# Patient Record
Sex: Female | Born: 1945 | Race: Black or African American | Hispanic: No | Smoking: Never smoker
Health system: Southern US, Community
[De-identification: ages and names within clinical notes are randomized; demographics above are authoritative.]

## PROBLEM LIST (undated history)

## (undated) DIAGNOSIS — J449 Chronic obstructive pulmonary disease, unspecified: Secondary | ICD-10-CM

## (undated) DIAGNOSIS — J4489 Other specified chronic obstructive pulmonary disease: Secondary | ICD-10-CM

## (undated) DIAGNOSIS — J15212 Pneumonia due to Methicillin resistant Staphylococcus aureus: Secondary | ICD-10-CM

## (undated) DIAGNOSIS — N182 Chronic kidney disease, stage 2 (mild): Secondary | ICD-10-CM

## (undated) DIAGNOSIS — J9621 Acute and chronic respiratory failure with hypoxia: Secondary | ICD-10-CM

## (undated) DIAGNOSIS — J80 Acute respiratory distress syndrome: Secondary | ICD-10-CM

## (undated) HISTORY — DX: Acute and chronic respiratory failure with hypoxia: J96.21

## (undated) HISTORY — DX: Acute respiratory distress syndrome: J80

## (undated) HISTORY — DX: Pneumonia due to methicillin resistant Staphylococcus aureus: J15.212

## (undated) HISTORY — DX: Other specified chronic obstructive pulmonary disease: J44.89

## (undated) HISTORY — DX: Chronic kidney disease, stage 2 (mild): N18.2

## (undated) HISTORY — PX: TRACHEOSTOMY: SUR1362

## (undated) HISTORY — DX: Chronic obstructive pulmonary disease, unspecified: J44.9

---

## 2019-02-06 ENCOUNTER — Other Ambulatory Visit: Payer: Self-pay | Admitting: Internal Medicine

## 2019-02-06 DIAGNOSIS — J449 Chronic obstructive pulmonary disease, unspecified: Secondary | ICD-10-CM

## 2019-02-06 DIAGNOSIS — J15212 Pneumonia due to Methicillin resistant Staphylococcus aureus: Secondary | ICD-10-CM

## 2019-02-06 DIAGNOSIS — J9621 Acute and chronic respiratory failure with hypoxia: Secondary | ICD-10-CM

## 2019-02-06 DIAGNOSIS — N182 Chronic kidney disease, stage 2 (mild): Secondary | ICD-10-CM

## 2019-02-06 DIAGNOSIS — J80 Acute respiratory distress syndrome: Secondary | ICD-10-CM

## 2019-02-06 NOTE — Progress Notes (Signed)
[place]  Date of Service: 02/06/2019  PULMONARY CONSULT   Lindsey Collins  ZSW:[id number]  DOB: May 17, 1946     Referring Physician: [name], MD  HPI: Lindsey Collins is a 73 y.o. female seen for Acute on Chronic Respiratory Failure.  Patient is on the ventilator and full support is endotracheally intubated and currently is on propofol.  Patient was initially admitted basically unresponsive chest x-ray had shown a whiteout of the left chest.  Patient was at that time diagnosed to have healthcare associated pneumonia secondary to MRSA and suspected aspiration.  COVID test was performed which was negative.  Patient further deteriorated with development of what appears to be ARDS.  She did require prolonged mechanical ventilation for the ARDS.  And at this time patient remains orally intubated.  She did have bronchoscopy at least twice for retained tracheal secretions.  Patient is also received vancomycin and Zosyn antibiotics for pneumonia.  She was extubated on May 13 as well as May 19 and failed.  After discussion with the family it was decided that she would need a tracheostomy.  The patient is transferred to our facility for further management and she remains orally intubated.  The complicating factor is that she has a previous history of thyroid surgery neck surgery and it was suggested that ENT evaluate the patient however patient was transferred to our facility prior to that having been done.  Other complications include history of congestive heart failure coronary artery disease.  Review of Systems:  ROS performed and is unremarkable other than noted above.  Past medical history: COPD Coronary artery disease Chronic systolic heart failure Hypertension MRSA pneumonia CKD Diabetes type 2 Chronic anemia Goiter Parkinson's disease Abdominal surgery.  Past surgical history: Neck surgery Thyroid surgery Partial thyroidectomy Parathyroid  adenoma surgery Knee surgery Umbilical surgery for a perforated diverticula Appendicitis  Social history: No alcohol or drug abuse. Unknown about tobacco use  Family History: Non-Contributory to the present illness  Allergies  Reviewed on the [facility]  Medications: Reviewed on Rounds  Physical Exam:  Vitals: Temperature 97.5 pulse 82 respiratory rate 14 blood pressure 92/52 saturations 99%  Ventilator Settings orally intubated with assist control FiO2 40% tidal volume 427 PEEP 5  . General: Comfortable at this time . Eyes: Grossly normal lids, irises & conjunctiva . ENT: grossly tongue is normal . Neck: no obvious mass . Cardiovascular: S1-S2 normal no gallop or rub is noted . Respiratory: Coarse breath sounds with a few rhonchi . Abdomen: Soft and nontender . Skin: no rash seen on limited exam . Musculoskeletal: not rigid . Psychiatric:unable to assess . Neurologic: no seizure no involuntary movements         Labs on Admission:  Sodium 141 potassium 2.9 BUN 20 creatinine 0.3 White count 6.9 hemoglobin 8.1 hematocrit 26.1 platelet count 296 ABG pH 7.43 PCO2 46.6 PO2 136  Radiological Exams on Admission: *EXAM: Portable chest   INDICATION: Intubation, respiratory distress, central line placement  COMPARISON:  02/14/2005  TECHNIQUE: AP portable view of the chest was performed.  FINDINGS: A right sided central line is in place with catheter tip injecting over the SVC near cavoatrial junction. Endotracheal tube in place with tip projecting over the distal trachea at the level of the aortic arch approximately 3 7 m above the carina basilar haziness is present with a potential left lower lobe retrocardiac infiltrate. No pneumothorax. No definite pleural effusion. Heart size and mediastinal contours are within normal limits.  IMPRESSION:  Right central line and endotracheal tube in good positions. Suspect left lower lobe infiltrate versus atelectasis. No  pneumothorax.   Electronically Signed by:  Lars Pinks, MD, [facility] Radiology Electronically Signed on:  02/05/2019 4:49 PM  Assessment/Plan Patient Active Problem List   Diagnosis Date Noted  . Acute on chronic respiratory failure with hypoxia ([place])   . MRSA (methicillin resistant staphylococcus aureus) pneumonia ([place])   . MRSA (methicillin resistant staphylococcus aureus) pneumonia ([place])   . ARDS (adult respiratory distress syndrome) ([place])   . Obstructive chronic bronchitis without exacerbation ([place])   . Chronic kidney disease, stage II (mild)      1. Acute on chronic respiratory failure with hypoxia patient is currently on full support on the ventilator and assist control mode.  Patient remains orally intubated.  Has a high risk airway.  She is also requiring sedation with propofol.  Of note patient has failed extubation x2 at the other facility.  She has a complex airway history and will need tracheostomy to be done.  Discussed with primary care team and probably will need to get surgery regarding possibility of tracheostomy. 2. MRSA pneumonia healthcare associated patient has been treated will follow-up radiologically continue with supportive care continue aggressive pulmonary toilet.  She did require bronchoscopy for retained tracheal secretions so we need to be vigilant in this regard. 3. ARDS appears to be improving we will continue to monitor radiologically monitor her FiO2 requirements as well as PEEP requirements. 4. COPD reportedly patient has been on oxygen at home chronically we will continue with supportive care 5. Chronic kidney disease monitor labs as above currently creatinine is 0.3 we will continue with supportive care  I have personally seen and evaluated the patient, evaluated laboratory and imaging results, formulated the assessment and plan and placed orders. The Patient requires high complexity decision making for assessment and support.  Case was discussed on Rounds  with the Respiratory Therapy Staff Time Spent 11minutes  Allyne Gee, MD [facility]

## 2019-02-07 ENCOUNTER — Encounter: Payer: Self-pay | Admitting: Internal Medicine

## 2019-02-07 ENCOUNTER — Other Ambulatory Visit: Payer: Self-pay | Admitting: Internal Medicine

## 2019-02-07 DIAGNOSIS — J15212 Pneumonia due to Methicillin resistant Staphylococcus aureus: Secondary | ICD-10-CM

## 2019-02-07 DIAGNOSIS — J449 Chronic obstructive pulmonary disease, unspecified: Secondary | ICD-10-CM | POA: Insufficient documentation

## 2019-02-07 DIAGNOSIS — J9621 Acute and chronic respiratory failure with hypoxia: Secondary | ICD-10-CM

## 2019-02-07 DIAGNOSIS — J80 Acute respiratory distress syndrome: Secondary | ICD-10-CM

## 2019-02-07 DIAGNOSIS — N182 Chronic kidney disease, stage 2 (mild): Secondary | ICD-10-CM

## 2019-02-07 DIAGNOSIS — J9622 Acute and chronic respiratory failure with hypercapnia: Secondary | ICD-10-CM | POA: Insufficient documentation

## 2019-02-07 DIAGNOSIS — J4489 Other specified chronic obstructive pulmonary disease: Secondary | ICD-10-CM | POA: Insufficient documentation

## 2019-02-07 NOTE — Progress Notes (Signed)
[place] NOTE  PULMONARY SERVICE ROUNDS  Date of Service: 02/07/2019  Lindsey Collins  DOB: 06/03/1946  Referring physician: [name], MD  HPI: Lindsey Collins is a 73 y.o. female  being seen for Acute on Chronic Respiratory Failure.  Patient remains on the ventilator orally intubated grossly unchanged.  She has been on full support no weaning has been done  Review of Systems: Unremarkable other than noted in HPI  Allergies:  Reviewed on the [facility]  Medications: Reviewed  Vitals: Temperature 98.4 pulse 89 respiratory rate 12 blood pressure 97/51 saturations 100%  Ventilator Settings: Mode ventilation assist control FiO2 40% tidal volume 400 PEEP 5  Physical Exam: . General:  calm and comfortable NAD . Eyes: normal lids, irises & conjunctiva . ENT: grossly normal tongue not enlarged . Neck: no masses . Cardiovascular: S1 S2 Normal no rubs no gallop . Respiratory: No rhonchi no rales are noted at this time . Abdomen: soft non-distended . Skin: no rash seen on limited exam . Musculoskeletal:  no rigidity . Psychiatric: unable to assess . Neurologic: no involuntary movements          Lab Data and radiological Data:  Data has been reviewed   Assessment/Plan  Patient Active Problem List   Diagnosis Date Noted  . Acute on chronic respiratory failure with hypoxia ([place])   . MRSA (methicillin resistant staphylococcus aureus) pneumonia ([place])   . MRSA (methicillin resistant staphylococcus aureus) pneumonia ([place])   . ARDS (adult respiratory distress syndrome) ([place])   . Obstructive chronic bronchitis without exacerbation ([place])   . Chronic kidney disease, stage II (mild)       1. Acute on chronic respiratory failure with hypoxia we will continue with full support on the ventilator on assist control she is orally intubated high risk airway.  Will likely need to have a tracheostomy done secondary to her previous incursion into the head and neck  area by surgery and also the fact that she has actually failed extubation x2 at the other facility prior to being transferred here 2. MRSA pneumonia healthcare associated pneumonia treated we will continue to monitor closely.  She is a high aspiration risk secondary to history of Parkinson's disease. 3. ARDS resolving improved oxygenation. 4. COPD at baseline we will continue present management 5. Chronic kidney disease stable we will continue to monitor   I have personally evaluated the patient, evaluated the laboratory and imaging results and formulated the assessment and plan and placed orders as needed. The Patient requires high complexity decision making for assessment and support. I have discussed the patient on rounds with the Respiratory Staff   Allyne Gee, MD [facility] Pulmonary Critical Care Medicine

## 2019-02-08 ENCOUNTER — Other Ambulatory Visit (HOSPITAL_COMMUNITY): Payer: Medicare Other | Admitting: Internal Medicine

## 2019-02-08 DIAGNOSIS — J449 Chronic obstructive pulmonary disease, unspecified: Secondary | ICD-10-CM

## 2019-02-08 DIAGNOSIS — J9621 Acute and chronic respiratory failure with hypoxia: Secondary | ICD-10-CM | POA: Diagnosis not present

## 2019-02-08 DIAGNOSIS — J15212 Pneumonia due to Methicillin resistant Staphylococcus aureus: Secondary | ICD-10-CM | POA: Diagnosis not present

## 2019-02-08 DIAGNOSIS — N182 Chronic kidney disease, stage 2 (mild): Secondary | ICD-10-CM | POA: Diagnosis not present

## 2019-02-08 DIAGNOSIS — J80 Acute respiratory distress syndrome: Secondary | ICD-10-CM | POA: Diagnosis not present

## 2019-02-08 NOTE — Progress Notes (Signed)
[place] NOTE  PULMONARY SERVICE ROUNDS  Date of Service: 02/08/2019  Lindsey Collins  DOB: [birth year]  Referring physician: [name], MD  HPI: Lindsey Collins is a 73 y.o. female  being seen for Acute on Chronic Respiratory Failure.  Patient is currently on full support on the ventilator failed weaning attempt today was on 40% FiO2  Review of Systems: Unremarkable other than noted in HPI  Allergies:  Reviewed on the [facility]  Medications: Reviewed  Vitals: Temperature 99.4 pulse 77 respiratory 16 blood pressure 106/55 saturations 100%  Ventilator Settings: Mode of ventilation on assist control FiO2 40% PEEP 5 tidal volume 400  Physical Exam: . General:  calm and comfortable NAD . Eyes: normal lids, irises & conjunctiva . ENT: grossly normal tongue not enlarged . Neck: no masses . Cardiovascular: S1 S2 Normal no rubs no gallop . Respiratory: Scattered rhonchi coarse breath sounds . Abdomen: soft non-distended . Skin: no rash seen on limited exam . Musculoskeletal:  no rigidity . Psychiatric: unable to assess . Neurologic: no involuntary movements          Lab Data and radiological Data:  No labs to report today   Assessment/Plan  Patient Active Problem List   Diagnosis Date Noted  . Acute on chronic respiratory failure with hypoxia ([place])   . MRSA (methicillin resistant staphylococcus aureus) pneumonia ([place])   . MRSA (methicillin resistant staphylococcus aureus) pneumonia ([place])   . ARDS (adult respiratory distress syndrome) ([place])   . Obstructive chronic bronchitis without exacerbation ([place])   . Chronic kidney disease, stage II (mild)       1. Acute on chronic respiratory failure with hypoxia we will continue with full support on the ventilator check the spontaneous breathing trial also titrate oxygen down 2. MRSA pneumonia treated we will continue to follow radiologically 3. ARDS at baseline we will continue to monitor. 4. COPD  severe disease we will continue with supportive care 5. Chronic kidney disease monitor labs   I have personally evaluated the patient, evaluated the laboratory and imaging results and formulated the assessment and plan and placed orders as needed. The Patient requires high complexity decision making for assessment and support. I have discussed the patient on rounds with the Respiratory Staff   Allyne Gee, MD [facility] Pulmonary Critical Care Medicine

## 2019-02-09 ENCOUNTER — Other Ambulatory Visit (HOSPITAL_COMMUNITY): Payer: Medicare Other | Admitting: Internal Medicine

## 2019-02-09 DIAGNOSIS — J15212 Pneumonia due to Methicillin resistant Staphylococcus aureus: Secondary | ICD-10-CM

## 2019-02-09 DIAGNOSIS — J80 Acute respiratory distress syndrome: Secondary | ICD-10-CM

## 2019-02-09 DIAGNOSIS — J449 Chronic obstructive pulmonary disease, unspecified: Secondary | ICD-10-CM

## 2019-02-09 DIAGNOSIS — N182 Chronic kidney disease, stage 2 (mild): Secondary | ICD-10-CM | POA: Diagnosis not present

## 2019-02-09 DIAGNOSIS — J9621 Acute and chronic respiratory failure with hypoxia: Secondary | ICD-10-CM

## 2019-02-09 NOTE — Progress Notes (Signed)
[place] NOTE  PULMONARY SERVICE ROUNDS  Date of Service: 02/09/2019  Lindsey Collins  DOB: Apr 19, 1946  Referring physician: [name], MD  HPI: Lindsey Collins is a 73 y.o. female  being seen for Acute on Chronic Respiratory Failure.  Patient currently is on full support on assist control mode.  Attempted weaning did not tolerated  Review of Systems: Unremarkable other than noted in HPI  Allergies:  Reviewed on the [facility]  Medications: Reviewed  Vitals: Temperature 99.3 pulse 88 respiratory rate 13 blood pressure is 112/62  Ventilator Settings: Mode ventilation assist control FiO2 40% tidal volume 465 PEEP 5  Physical Exam: . General:  calm and comfortable NAD . Eyes: normal lids, irises & conjunctiva . ENT: grossly normal tongue not enlarged . Neck: no masses . Cardiovascular: S1 S2 Normal no rubs no gallop . Respiratory: Coarse rhonchi expansion . Abdomen: soft non-distended . Skin: no rash seen on limited exam . Musculoskeletal:  no rigidity . Psychiatric: unable to assess . Neurologic: no involuntary movements          Lab Data and radiological Data:  Data reviewed   Assessment/Plan  Patient Active Problem List   Diagnosis Date Noted  . Acute on chronic respiratory failure with hypoxia ([place])   . MRSA (methicillin resistant staphylococcus aureus) pneumonia ([place])   . MRSA (methicillin resistant staphylococcus aureus) pneumonia ([place])   . ARDS (adult respiratory distress syndrome) ([place])   . Obstructive chronic bronchitis without exacerbation ([place])   . Chronic kidney disease, stage II (mild)       1. Acute on chronic respiratory failure with hypoxia we will continue with full support on assist control mode currently on 40% FiO2 titrate oxygen down as tolerated 2. MRSA pneumonia treated clinically as it seems to be slowly improving 3. ARDS at baseline continue present management 4. COPD unknown severity continue present  management 5. Chronic kidney disease monitor labs   I have personally evaluated the patient, evaluated the laboratory and imaging results and formulated the assessment and plan and placed orders as needed. The Patient requires high complexity decision making for assessment and support. I have discussed the patient on rounds with the Respiratory Staff   Allyne Gee, MD [facility] Pulmonary Critical Care Medicine

## 2019-02-10 ENCOUNTER — Other Ambulatory Visit (HOSPITAL_COMMUNITY): Payer: Medicare Other | Admitting: Internal Medicine

## 2019-02-10 DIAGNOSIS — J449 Chronic obstructive pulmonary disease, unspecified: Secondary | ICD-10-CM

## 2019-02-10 DIAGNOSIS — J15212 Pneumonia due to Methicillin resistant Staphylococcus aureus: Secondary | ICD-10-CM | POA: Diagnosis not present

## 2019-02-10 DIAGNOSIS — J80 Acute respiratory distress syndrome: Secondary | ICD-10-CM | POA: Diagnosis not present

## 2019-02-10 DIAGNOSIS — J9621 Acute and chronic respiratory failure with hypoxia: Secondary | ICD-10-CM

## 2019-02-10 DIAGNOSIS — N182 Chronic kidney disease, stage 2 (mild): Secondary | ICD-10-CM | POA: Diagnosis not present

## 2019-02-10 NOTE — Progress Notes (Signed)
[place] NOTE  PULMONARY SERVICE ROUNDS  Date of Service: 02/10/2019  Lindsey Collins  DOB: [birth year]  Referring physician: [name], MD  HPI: Lindsey Collins is a 73 y.o. female  being seen for Acute on Chronic Respiratory Failure.  Patient is status post tracheostomy this morning right now is on assist control mode  Review of Systems: Unremarkable other than noted in HPI  Allergies:  Reviewed on the [facility]  Medications: Reviewed  Vitals: Temperature 98.3 pulse 70 respiratory rate 2120 blood pressure 108/58 saturations 100%  Ventilator Settings: Mode of ventilation assist control FiO2 40% tidal volume 496 PEEP 5  Physical Exam: . General:  calm and comfortable NAD . Eyes: normal lids, irises & conjunctiva . ENT: grossly normal tongue not enlarged . Neck: no masses . Cardiovascular: S1 S2 Normal no rubs no gallop . Respiratory: Scattered rhonchi expansion was equal . Abdomen: soft non-distended . Skin: no rash seen on limited exam . Musculoskeletal:  no rigidity . Psychiatric: unable to assess . Neurologic: no involuntary movements          Lab Data and radiological Data:  Sodium 140 potassium 3.2 BUN 5 creatinine 0.4 White count 5.9 hemoglobin 8.2 hematocrit 26.8 platelet count 216   Assessment/Plan  Patient Active Problem List   Diagnosis Date Noted  . Acute on chronic respiratory failure with hypoxia ([place])   . MRSA (methicillin resistant staphylococcus aureus) pneumonia ([place])   . MRSA (methicillin resistant staphylococcus aureus) pneumonia ([place])   . ARDS (adult respiratory distress syndrome) ([place])   . Obstructive chronic bronchitis without exacerbation ([place])   . Chronic kidney disease, stage II (mild)       1. Acute on chronic respiratory failure with hypoxia we will continue with full support on the ventilator and reassess again we can try to start weaning. 2. MRSA pneumonia treated clinically is improving 3. ARDS  unchanged 4. COPD at baseline 5. Chronic kidney disease resolved we will continue with supportive care   I have personally evaluated the patient, evaluated the laboratory and imaging results and formulated the assessment and plan and placed orders as needed. The Patient requires high complexity decision making for assessment and support. I have discussed the patient on rounds with the Respiratory Staff   Allyne Gee, MD [facility] Pulmonary Critical Care Medicine

## 2019-02-12 ENCOUNTER — Other Ambulatory Visit (HOSPITAL_COMMUNITY): Payer: Medicare Other | Admitting: Internal Medicine

## 2019-02-12 DIAGNOSIS — J449 Chronic obstructive pulmonary disease, unspecified: Secondary | ICD-10-CM

## 2019-02-12 DIAGNOSIS — J15212 Pneumonia due to Methicillin resistant Staphylococcus aureus: Secondary | ICD-10-CM

## 2019-02-12 DIAGNOSIS — J80 Acute respiratory distress syndrome: Secondary | ICD-10-CM

## 2019-02-12 DIAGNOSIS — J9621 Acute and chronic respiratory failure with hypoxia: Secondary | ICD-10-CM | POA: Diagnosis not present

## 2019-02-12 DIAGNOSIS — N182 Chronic kidney disease, stage 2 (mild): Secondary | ICD-10-CM | POA: Diagnosis not present

## 2019-02-12 NOTE — Progress Notes (Signed)
[place] NOTE  PULMONARY SERVICE ROUNDS  Date of Service: 02/12/2019  Lindsey Collins  DOB: 1946-08-29  Referring physician: [name], MD  HPI: Lindsey Collins is a 73 y.o. female  being seen for Acute on Chronic Respiratory Failure.  Patient is on full support assist control the ventilator is stable right now is on 35% FiO2  Review of Systems: Unremarkable other than noted in HPI  Allergies:  Reviewed on the [facility]  Medications: Reviewed  Vitals: Temperature 97.5 pulse 81 respiratory 26 blood pressure 147/67 saturations 97%  Ventilator Settings: Currently is on the ventilator assist control FiO2 35% tidal volume 429 PEEP 8  Physical Exam: . General:  calm and comfortable NAD . Eyes: normal lids, irises & conjunctiva . ENT: grossly normal tongue not enlarged . Neck: no masses . Cardiovascular: S1 S2 Normal no rubs no gallop . Respiratory: No rhonchi or rales are noted at this time . Abdomen: soft non-distended . Skin: no rash seen on limited exam . Musculoskeletal:  no rigidity . Psychiatric: unable to assess . Neurologic: no involuntary movements          Lab Data and radiological Data:  ABG pH 7.63 PCO2 31 PO2 79 Sodium 143 potassium 2.4 BUN 8 creatinine 0.5   Assessment/Plan  Patient Active Problem List   Diagnosis Date Noted  . Acute on chronic respiratory failure with hypoxia ([place])   . MRSA (methicillin resistant staphylococcus aureus) pneumonia ([place])   . MRSA (methicillin resistant staphylococcus aureus) pneumonia ([place])   . ARDS (adult respiratory distress syndrome) ([place])   . Obstructive chronic bronchitis without exacerbation ([place])   . Chronic kidney disease, stage II (mild)       1. Acute on chronic respiratory failure hypoxia we will continue with assessing the RSB I again continue pulmonary toilet secretion management 2. MRSA pneumonia treated we will monitor 3. ARDS clinically improving 4. COPD without  exacerbation we will continue to monitor 5. Chronic kidney disease labs resolved   I have personally evaluated the patient, evaluated the laboratory and imaging results and formulated the assessment and plan and placed orders as needed. The Patient requires high complexity decision making for assessment and support. I have discussed the patient on rounds with the Respiratory Staff   Allyne Gee, MD [facility] Pulmonary Critical Care Medicine

## 2019-02-13 ENCOUNTER — Other Ambulatory Visit (HOSPITAL_COMMUNITY): Payer: Medicare Other | Admitting: Internal Medicine

## 2019-02-13 DIAGNOSIS — N182 Chronic kidney disease, stage 2 (mild): Secondary | ICD-10-CM

## 2019-02-13 DIAGNOSIS — J449 Chronic obstructive pulmonary disease, unspecified: Secondary | ICD-10-CM

## 2019-02-13 DIAGNOSIS — J15212 Pneumonia due to Methicillin resistant Staphylococcus aureus: Secondary | ICD-10-CM

## 2019-02-13 DIAGNOSIS — J80 Acute respiratory distress syndrome: Secondary | ICD-10-CM | POA: Diagnosis not present

## 2019-02-13 DIAGNOSIS — J9621 Acute and chronic respiratory failure with hypoxia: Secondary | ICD-10-CM | POA: Diagnosis not present

## 2019-02-13 NOTE — Progress Notes (Signed)
[place] NOTE  PULMONARY SERVICE ROUNDS  Date of Service: 02/13/2019  RANDALYN AHMED  DOB: 1946/01/14  Referring physician: [name], MD  HPI: Lindsey Collins is a 73 y.o. female  being seen for Acute on Chronic Respiratory Failure.  Patient is on assist control mode currently on 35% FiO2 was attempted on our SBI but did not tolerate  Review of Systems: Unremarkable other than noted in HPI  Allergies:  Reviewed on the [facility]  Medications: Reviewed  Vitals: Temperature 97.7 pulse 77 respiratory 26 blood pressure 134/69 saturations 99%  Ventilator Settings: Mode of ventilation assist control FiO2 35% tidal volume is 450 PEEP 5  Physical Exam: . General:  calm and comfortable NAD . Eyes: normal lids, irises & conjunctiva . ENT: grossly normal tongue not enlarged . Neck: no masses . Cardiovascular: S1 S2 Normal no rubs no gallop . Respiratory: No rhonchi or rales are noted at this time. . Abdomen: soft non-distended . Skin: no rash seen on limited exam . Musculoskeletal:  no rigidity . Psychiatric: unable to assess . Neurologic: no involuntary movements          Lab Data and radiological Data:  ABG pH 7.57 PCO2 33 PO2 107 White count 8.2 hemoglobin 8.4 hematocrit 26.7 platelet count 218 Sodium 141 potassium 3.0 BUN 13 creatinine 0.7   Assessment/Plan  Patient Active Problem List   Diagnosis Date Noted  . Acute on chronic respiratory failure with hypoxia ([place])   . MRSA (methicillin resistant staphylococcus aureus) pneumonia ([place])   . MRSA (methicillin resistant staphylococcus aureus) pneumonia ([place])   . ARDS (adult respiratory distress syndrome) ([place])   . Obstructive chronic bronchitis without exacerbation ([place])   . Chronic kidney disease, stage II (mild)       1. Acute on chronic respiratory failure with hypoxia patient failed the RSB I today will reassess again later this afternoon. 2. Recent pneumonia treated followed  radiologically 3. ARDS clinically resolved 4. COPD at baseline continue present management 5. Chronic kidney disease resolved we will continue to monitor   I have personally evaluated the patient, evaluated the laboratory and imaging results and formulated the assessment and plan and placed orders as needed. The Patient requires high complexity decision making for assessment and support. I have discussed the patient on rounds with the Respiratory Staff   Allyne Gee, MD [facility] Pulmonary Critical Care Medicine

## 2019-02-14 ENCOUNTER — Other Ambulatory Visit (HOSPITAL_COMMUNITY): Payer: Medicare Other | Admitting: Internal Medicine

## 2019-02-14 DIAGNOSIS — J80 Acute respiratory distress syndrome: Secondary | ICD-10-CM | POA: Diagnosis not present

## 2019-02-14 DIAGNOSIS — N182 Chronic kidney disease, stage 2 (mild): Secondary | ICD-10-CM

## 2019-02-14 DIAGNOSIS — J15212 Pneumonia due to Methicillin resistant Staphylococcus aureus: Secondary | ICD-10-CM

## 2019-02-14 DIAGNOSIS — J9621 Acute and chronic respiratory failure with hypoxia: Secondary | ICD-10-CM | POA: Diagnosis not present

## 2019-02-14 DIAGNOSIS — J449 Chronic obstructive pulmonary disease, unspecified: Secondary | ICD-10-CM

## 2019-02-14 NOTE — Progress Notes (Signed)
[place] NOTE  PULMONARY SERVICE ROUNDS  Date of Service: 02/14/2019  Lindsey Collins  DOB: 17-Aug-1946  Referring physician: [name], MD  HPI: Lindsey Collins is a 73 y.o. female  being seen for Acute on Chronic Respiratory Failure.  Back on the ventilator and full support patient still has been on sedation which is being weaned gradually  Review of Systems: Unremarkable other than noted in HPI  Allergies:  Reviewed on the [facility]  Medications: Reviewed  Vitals: Temperature 98.7 pulse 78 respiratory 26 blood pressure 151/78 saturations 100%  Ventilator Settings: On the ventilator on assist control FiO2 35% tidal volume 419 PEEP 5  Physical Exam: . General:  calm and comfortable NAD . Eyes: normal lids, irises & conjunctiva . ENT: grossly normal tongue not enlarged . Neck: no masses . Cardiovascular: S1 S2 Normal no rubs no gallop . Respiratory: No rhonchi coarse breath sounds . Abdomen: soft non-distended . Skin: no rash seen on limited exam . Musculoskeletal:  no rigidity . Psychiatric: unable to assess . Neurologic: no involuntary movements          Lab Data and radiological Data:  No labs to report today   Assessment/Plan  Patient Active Problem List   Diagnosis Date Noted  . Acute on chronic respiratory failure with hypoxia ([place])   . MRSA (methicillin resistant staphylococcus aureus) pneumonia ([place])   . MRSA (methicillin resistant staphylococcus aureus) pneumonia ([place])   . ARDS (adult respiratory distress syndrome) ([place])   . Obstructive chronic bronchitis without exacerbation ([place])   . Chronic kidney disease, stage II (mild)       1. Acute on chronic respiratory failure with hypoxia we will continue with the full vent support at this time sedation being weaned as noted once patient is off sedation hopefully will be easier to advance on wean protocol 2. MRSA pneumonia treated follow radiologically 3. ARDS as above improving  clinically 4. COPD will continue with supportive care 5. Chronic kidney disease monitoring labs which is resolved   I have personally evaluated the patient, evaluated the laboratory and imaging results and formulated the assessment and plan and placed orders as needed. The Patient requires high complexity decision making for assessment and support. I have discussed the patient on rounds with the Respiratory Staff   Allyne Gee, MD [facility] Pulmonary Critical Care Medicine

## 2019-02-15 ENCOUNTER — Other Ambulatory Visit (HOSPITAL_COMMUNITY): Payer: Medicare Other | Admitting: Internal Medicine

## 2019-02-15 DIAGNOSIS — J80 Acute respiratory distress syndrome: Secondary | ICD-10-CM | POA: Diagnosis not present

## 2019-02-15 DIAGNOSIS — J9621 Acute and chronic respiratory failure with hypoxia: Secondary | ICD-10-CM

## 2019-02-15 DIAGNOSIS — N182 Chronic kidney disease, stage 2 (mild): Secondary | ICD-10-CM

## 2019-02-15 DIAGNOSIS — J15212 Pneumonia due to Methicillin resistant Staphylococcus aureus: Secondary | ICD-10-CM | POA: Diagnosis not present

## 2019-02-15 DIAGNOSIS — J449 Chronic obstructive pulmonary disease, unspecified: Secondary | ICD-10-CM

## 2019-02-15 NOTE — Progress Notes (Signed)
[place] NOTE  PULMONARY SERVICE ROUNDS  Date of Service: 02/15/2019  Lindsey Collins  DOB: 07/30/1946  Referring physician: [name], MD  HPI: Lindsey Collins is a 73 y.o. female  being seen for Acute on Chronic Respiratory Failure.  Patient was attempted on RSB I which the patient failed  Review of Systems: Unremarkable other than noted in HPI  Allergies:  Reviewed on the [facility]  Medications: Reviewed  Vitals: Temperature 98.5 pulse 92 respiratory rate 20 blood pressure 120/64 saturations 100%  Ventilator Settings: Mode of ventilation assist control at this time  Physical Exam: . General:  calm and comfortable NAD . Eyes: normal lids, irises & conjunctiva . ENT: grossly normal tongue not enlarged . Neck: no masses . Cardiovascular: S1 S2 Normal no rubs no gallop . Respiratory: No rhonchi coarse breath sounds . Abdomen: soft non-distended . Skin: no rash seen on limited exam . Musculoskeletal:  no rigidity . Psychiatric: unable to assess . Neurologic: no involuntary movements          Lab Data and radiological Data:  Sodium 137 potassium 4.5 BUN 25 creatinine 0.7 White count 10.1 hemoglobin 8.2 hematocrit 26.9 platelet count 199   Assessment/Plan  Patient Active Problem List   Diagnosis Date Noted  . Acute on chronic respiratory failure with hypoxia ([place])   . MRSA (methicillin resistant staphylococcus aureus) pneumonia ([place])   . MRSA (methicillin resistant staphylococcus aureus) pneumonia ([place])   . ARDS (adult respiratory distress syndrome) ([place])   . Obstructive chronic bronchitis without exacerbation ([place])   . Chronic kidney disease, stage II (mild)       1. Acute on chronic respiratory failure hypoxia we will continue with full support on the ventilator at this time. 2. MRSA pneumonia treated clinically improving we will follow along 3. ARDS resolved continue with present management.  No oxygen requirements are  low 4. Obstructive chronic bronchitis at baseline nebs as needed 5. Chronic kidney disease resolved   I have personally evaluated the patient, evaluated the laboratory and imaging results and formulated the assessment and plan and placed orders as needed. The Patient requires high complexity decision making for assessment and support. I have discussed the patient on rounds with the Respiratory Staff   Allyne Gee, MD [facility] Pulmonary Critical Care Medicine

## 2019-02-17 ENCOUNTER — Other Ambulatory Visit (HOSPITAL_COMMUNITY): Payer: Medicare Other | Admitting: Internal Medicine

## 2019-02-17 DIAGNOSIS — J15212 Pneumonia due to Methicillin resistant Staphylococcus aureus: Secondary | ICD-10-CM | POA: Diagnosis not present

## 2019-02-17 DIAGNOSIS — J9621 Acute and chronic respiratory failure with hypoxia: Secondary | ICD-10-CM | POA: Diagnosis not present

## 2019-02-17 DIAGNOSIS — J449 Chronic obstructive pulmonary disease, unspecified: Secondary | ICD-10-CM

## 2019-02-17 DIAGNOSIS — N182 Chronic kidney disease, stage 2 (mild): Secondary | ICD-10-CM | POA: Diagnosis not present

## 2019-02-17 DIAGNOSIS — J80 Acute respiratory distress syndrome: Secondary | ICD-10-CM

## 2019-02-17 DIAGNOSIS — J4489 Other specified chronic obstructive pulmonary disease: Secondary | ICD-10-CM

## 2019-02-17 NOTE — Progress Notes (Signed)
[place] NOTE  PULMONARY SERVICE ROUNDS  Date of Service: 02/17/2019  Lindsey Collins  DOB: [birth year]  Referring physician: [name], MD  HPI: Lindsey Collins is a 73 y.o. female  being seen for Acute on Chronic Respiratory Failure.  Currently patient is on full support on assist control mode has been on 30% FiO2 with a PEEP of 5  Review of Systems: Unremarkable other than noted in HPI  Allergies:  Reviewed on the [facility]  Medications: Reviewed  Vitals: Temperature 99.6 pulse 98 respiratory 19 blood pressure 140/83 saturations 99%  Ventilator Settings: Mode of ventilation assist control FiO2 30% tidal volume 420 PEEP 5  Physical Exam: . General:  calm and comfortable NAD . Eyes: normal lids, irises & conjunctiva . ENT: grossly normal tongue not enlarged . Neck: no masses . Cardiovascular: S1 S2 Normal no rubs no gallop . Respiratory: No rhonchi no rales are noted at this time . Abdomen: soft non-distended . Skin: no rash seen on limited exam . Musculoskeletal:  no rigidity . Psychiatric: unable to assess . Neurologic: no involuntary movements          Lab Data and radiological Data:  No labs to report   Assessment/Plan  Patient Active Problem List   Diagnosis Date Noted  . Acute on chronic respiratory failure with hypoxia ([place])   . MRSA (methicillin resistant staphylococcus aureus) pneumonia ([place])   . MRSA (methicillin resistant staphylococcus aureus) pneumonia ([place])   . ARDS (adult respiratory distress syndrome) ([place])   . Obstructive chronic bronchitis without exacerbation ([place])   . Chronic kidney disease, stage II (mild)       1. Acute on chronic respiratory failure with hypoxia we will continue with full support on assist control mode patient is on 30% FiO2 not able to do any weaning we are waiting word from the family as far as comfort care is concerned 2. MRSA pneumonia treated we will continue supportive care 3. ARDS  resolved we will monitor 4. COPD severe disease 5. Chronic kidney disease stable labs   I have personally evaluated the patient, evaluated the laboratory and imaging results and formulated the assessment and plan and placed orders as needed. The Patient requires high complexity decision making for assessment and support. I have discussed the patient on rounds with the Respiratory Staff   Allyne Gee, MD [facility] Pulmonary Critical Care Medicine

## 2019-02-19 ENCOUNTER — Other Ambulatory Visit (HOSPITAL_COMMUNITY): Payer: Medicare Other | Admitting: Internal Medicine

## 2019-02-19 DIAGNOSIS — J9621 Acute and chronic respiratory failure with hypoxia: Secondary | ICD-10-CM | POA: Diagnosis not present

## 2019-02-19 DIAGNOSIS — N182 Chronic kidney disease, stage 2 (mild): Secondary | ICD-10-CM | POA: Diagnosis not present

## 2019-02-19 DIAGNOSIS — J15212 Pneumonia due to Methicillin resistant Staphylococcus aureus: Secondary | ICD-10-CM

## 2019-02-19 DIAGNOSIS — J80 Acute respiratory distress syndrome: Secondary | ICD-10-CM | POA: Diagnosis not present

## 2019-02-19 DIAGNOSIS — J449 Chronic obstructive pulmonary disease, unspecified: Secondary | ICD-10-CM

## 2019-02-19 NOTE — Progress Notes (Signed)
[place] NOTE  PULMONARY SERVICE ROUNDS  Date of Service: 02/19/2019  JOHANNY SEGERS  DOB: 28-Aug-1946  Referring physician: [name], MD  HPI: Lindsey Collins is a 73 y.o. female  being seen for Acute on Chronic Respiratory Failure.  Patient currently is on assist control mode comfortable right now without distress remains on 30% FiO2 our SBI was attempted and the patient failed  Review of Systems: Unremarkable other than noted in HPI  Allergies:  Reviewed on the [facility]  Medications: Reviewed  Vitals: Temperature 100.3 pulse 104 respiratory rate 20 blood pressure 133/74 saturations 98%  Ventilator Settings: Mode of ventilation assist control FiO2 30% tidal volume 400 PEEP 5  Physical Exam: . General:  calm and comfortable NAD . Eyes: normal lids, irises & conjunctiva . ENT: grossly normal tongue not enlarged . Neck: no masses . Cardiovascular: S1 S2 Normal no rubs no gallop . Respiratory: Scattered rhonchi expansion is equal . Abdomen: soft non-distended . Skin: no rash seen on limited exam . Musculoskeletal:  no rigidity . Psychiatric: unable to assess . Neurologic: no involuntary movements          Lab Data and radiological Data:  Sodium 142 potassium 4.9 BUN 49 creatinine 1.0   Assessment/Plan  Patient Active Problem List   Diagnosis Date Noted  . Acute on chronic respiratory failure with hypoxia ([place])   . MRSA (methicillin resistant staphylococcus aureus) pneumonia ([place])   . MRSA (methicillin resistant staphylococcus aureus) pneumonia ([place])   . ARDS (adult respiratory distress syndrome) ([place])   . Obstructive chronic bronchitis without exacerbation ([place])   . Chronic kidney disease, stage II (mild)       1. Acute on chronic respiratory failure with hypoxia she continue with full support on assist control mode patient is likely septic so therefore is not been to be able to wean 2. MRSA pneumonia has been treated we will  follow-up radiologically follow-up cultures 3. ARDS patient is at baseline 4. COPD at baseline we will continue to monitor 5. Chronic kidney disease BUN slightly elevated monitor hydration status   I have personally evaluated the patient, evaluated the laboratory and imaging results and formulated the assessment and plan and placed orders as needed. The Patient requires high complexity decision making for assessment and support. I have discussed the patient on rounds with the Respiratory Staff   Allyne Gee, MD [facility] Pulmonary Critical Care Medicine

## 2019-02-20 ENCOUNTER — Other Ambulatory Visit (HOSPITAL_COMMUNITY): Payer: Medicare Other | Admitting: Internal Medicine

## 2019-02-20 DIAGNOSIS — J80 Acute respiratory distress syndrome: Secondary | ICD-10-CM

## 2019-02-20 DIAGNOSIS — J449 Chronic obstructive pulmonary disease, unspecified: Secondary | ICD-10-CM

## 2019-02-20 DIAGNOSIS — N182 Chronic kidney disease, stage 2 (mild): Secondary | ICD-10-CM

## 2019-02-20 DIAGNOSIS — J9621 Acute and chronic respiratory failure with hypoxia: Secondary | ICD-10-CM

## 2019-02-20 DIAGNOSIS — J15212 Pneumonia due to Methicillin resistant Staphylococcus aureus: Secondary | ICD-10-CM

## 2019-02-20 NOTE — Progress Notes (Signed)
[place] NOTE  PULMONARY SERVICE ROUNDS  Date of Service: 02/20/2019  Lindsey Collins  DOB: 02/14/1946  Referring physician: [name], MD  HPI: Lindsey Collins is a 73 y.o. female  being seen for Acute on Chronic Respiratory Failure.  Patient currently is on full support on assist control mode has been on 40% FiO2  Review of Systems: Unremarkable other than noted in HPI  Allergies:  Reviewed on the [facility]  Medications: Reviewed  Vitals: Temperature 98.5 pulse 85 respiratory rate 17 blood pressure 101/59 saturations 99%  Ventilator Settings: Mode ventilation assist control FiO2 is 40% tidal volume 474 PEEP 5  Physical Exam: . General:  calm and comfortable NAD . Eyes: normal lids, irises & conjunctiva . ENT: grossly normal tongue not enlarged . Neck: no masses . Cardiovascular: S1 S2 Normal no rubs no gallop . Respiratory: No rhonchi no rales . Abdomen: soft non-distended . Skin: no rash seen on limited exam . Musculoskeletal:  no rigidity . Psychiatric: unable to assess . Neurologic: no involuntary movements          Lab Data and radiological Data:  Sodium 143 potassium 4.3 BUN is 46 creatinine 0.6 White count 12 hemoglobin 8.1 hematocrit 26.9 platelet count 375   Assessment/Plan  Patient Active Problem List   Diagnosis Date Noted  . Acute on chronic respiratory failure with hypoxia ([place])   . MRSA (methicillin resistant staphylococcus aureus) pneumonia ([place])   . MRSA (methicillin resistant staphylococcus aureus) pneumonia ([employer])   . ARDS (adult respiratory distress syndrome) ([place])   . Obstructive chronic bronchitis without exacerbation ([place])   . Chronic kidney disease, stage II (mild)       1. Acute on chronic respiratory failure with hypoxia on full vent support not able to wean we will continue with supportive care 2. MRSA pneumonia treated follow radiologically 3. ARDS resolved 4. COPD at baseline continue with supportive  care 5. Chronic kidney disease labs show improvement   I have personally evaluated the patient, evaluated the laboratory and imaging results and formulated the assessment and plan and placed orders as needed. The Patient requires high complexity decision making for assessment and support. I have discussed the patient on rounds with the Respiratory Staff   Allyne Gee, MD [facility] Pulmonary Critical Care Medicine

## 2019-02-21 ENCOUNTER — Other Ambulatory Visit (HOSPITAL_COMMUNITY): Payer: Medicare Other | Admitting: Internal Medicine

## 2019-02-21 DIAGNOSIS — N182 Chronic kidney disease, stage 2 (mild): Secondary | ICD-10-CM | POA: Diagnosis not present

## 2019-02-21 DIAGNOSIS — J80 Acute respiratory distress syndrome: Secondary | ICD-10-CM

## 2019-02-21 DIAGNOSIS — J9621 Acute and chronic respiratory failure with hypoxia: Secondary | ICD-10-CM

## 2019-02-21 DIAGNOSIS — J449 Chronic obstructive pulmonary disease, unspecified: Secondary | ICD-10-CM

## 2019-02-21 DIAGNOSIS — J4489 Other specified chronic obstructive pulmonary disease: Secondary | ICD-10-CM

## 2019-02-21 DIAGNOSIS — J15212 Pneumonia due to Methicillin resistant Staphylococcus aureus: Secondary | ICD-10-CM | POA: Diagnosis not present

## 2019-02-21 NOTE — Progress Notes (Signed)
[place] NOTE  PULMONARY SERVICE ROUNDS  Date of Service: 02/21/2019  Lindsey Collins  DOB: 06-Apr-1946  Referring physician: [name], MD  HPI: Lindsey Collins is a 73 y.o. female  being seen for Acute on Chronic Respiratory Failure.  Patient is comfortable right now without distress remains on assist control mode  Review of Systems: Unremarkable other than noted in HPI  Allergies:  Reviewed on the [facility]  Medications: Reviewed  Vitals: Temperature 97.9 pulse 78 respiratory 20 blood pressure 100/60 saturations 100%  Ventilator Settings: On assist control full support FiO2 40% tidal volume 400 PEEP 5  Physical Exam: . General:  calm and comfortable NAD . Eyes: normal lids, irises & conjunctiva . ENT: grossly normal tongue not enlarged . Neck: no masses . Cardiovascular: S1 S2 Normal no rubs no gallop . Respiratory: No rhonchi or rales are noted . Abdomen: soft non-distended . Skin: no rash seen on limited exam . Musculoskeletal:  no rigidity . Psychiatric: unable to assess . Neurologic: no involuntary movements          Lab Data and radiological Data:  Sodium 146 potassium 4.9 BUN 36 creatinine 0.6 White count 11.3 hemoglobin 7.8 hematocrit 26.6 platelet count 380   Assessment/Plan  Patient Active Problem List   Diagnosis Date Noted  . Acute on chronic respiratory failure with hypoxia ([place])   . MRSA (methicillin resistant staphylococcus aureus) pneumonia ([place])   . MRSA (methicillin resistant staphylococcus aureus) pneumonia ([place])   . ARDS (adult respiratory distress syndrome) ([place])   . Obstructive chronic bronchitis without exacerbation ([place])   . Chronic kidney disease, stage II (mild)       1. Acute on chronic respiratory failure with hypoxia we will continue with support on assist control mode 2. MRSA pneumonia treated 3. ARDS resolved we will continue to follow 4. COPD at baseline 5. Chronic kidney disease numbers are at  baseline   I have personally evaluated the patient, evaluated the laboratory and imaging results and formulated the assessment and plan and placed orders as needed. The Patient requires high complexity decision making for assessment and support. I have discussed the patient on rounds with the Respiratory Staff   Allyne Gee, MD [facility] Pulmonary Critical Care Medicine

## 2019-02-22 ENCOUNTER — Other Ambulatory Visit (HOSPITAL_COMMUNITY): Payer: Medicare Other | Admitting: Internal Medicine

## 2019-02-22 DIAGNOSIS — J9621 Acute and chronic respiratory failure with hypoxia: Secondary | ICD-10-CM | POA: Diagnosis not present

## 2019-02-22 DIAGNOSIS — J80 Acute respiratory distress syndrome: Secondary | ICD-10-CM | POA: Diagnosis not present

## 2019-02-22 DIAGNOSIS — J449 Chronic obstructive pulmonary disease, unspecified: Secondary | ICD-10-CM

## 2019-02-22 DIAGNOSIS — N182 Chronic kidney disease, stage 2 (mild): Secondary | ICD-10-CM

## 2019-02-22 DIAGNOSIS — J15212 Pneumonia due to Methicillin resistant Staphylococcus aureus: Secondary | ICD-10-CM

## 2019-02-22 NOTE — Progress Notes (Signed)
[place] NOTE  PULMONARY SERVICE ROUNDS  Date of Service: 02/22/2019  Lindsey Collins  DOB: [birth year]  Referring physician: [name], MD  HPI: Lindsey Collins is a 73 y.o. female  being seen for Acute on Chronic Respiratory Failure.  Remains on the ventilator full support on assist control mode has been on 30% FiO2 the patient had an RSB I of 125 today  Review of Systems: Unremarkable other than noted in HPI  Allergies:  Reviewed on the [facility]  Medications: Reviewed  Vitals: Temperature 98.6 pulse 81 respiratory 22 blood pressure is 100/60 saturations 99%  Ventilator Settings: Mode of ventilation assist control FiO2 is 30% tidal volume 400 PEEP 5  Physical Exam: . General:  calm and comfortable NAD . Eyes: normal lids, irises & conjunctiva . ENT: grossly normal tongue not enlarged . Neck: no masses . Cardiovascular: S1 S2 Normal no rubs no gallop . Respiratory: Coarse breath sounds with few rhonchi . Abdomen: soft non-distended . Skin: no rash seen on limited exam . Musculoskeletal:  no rigidity . Psychiatric: unable to assess . Neurologic: no involuntary movements          Lab Data and radiological Data:  No labs today   Assessment/Plan  Patient Active Problem List   Diagnosis Date Noted  . Acute on chronic respiratory failure with hypoxia ([place])   . MRSA (methicillin resistant staphylococcus aureus) pneumonia ([place])   . MRSA (methicillin resistant staphylococcus aureus) pneumonia ([place])   . ARDS (adult respiratory distress syndrome) ([place])   . Obstructive chronic bronchitis without exacerbation ([place])   . Chronic kidney disease, stage II (mild)       1. Acute on chronic respiratory failure with hypoxia at this time patient is on full vent support has not been tolerating weaning started on antibiotics for new infiltrate pneumonia.  Will hold off on weaning today 2. MRSA pneumonia as above day 2 of antibiotics today continue with  supportive care 3. ARDS clinically resolved 4. COPD at baseline 5. Chronic kidney disease stage III we will continue present management   I have personally evaluated the patient, evaluated the laboratory and imaging results and formulated the assessment and plan and placed orders as needed. The Patient requires high complexity decision making for assessment and support. I have discussed the patient on rounds with the Respiratory Staff   Allyne Gee, MD [facility] Pulmonary Critical Care Medicine

## 2019-02-23 ENCOUNTER — Other Ambulatory Visit (HOSPITAL_COMMUNITY): Payer: Medicare Other | Admitting: Internal Medicine

## 2019-02-23 DIAGNOSIS — J15212 Pneumonia due to Methicillin resistant Staphylococcus aureus: Secondary | ICD-10-CM

## 2019-02-23 DIAGNOSIS — J80 Acute respiratory distress syndrome: Secondary | ICD-10-CM

## 2019-02-23 DIAGNOSIS — N182 Chronic kidney disease, stage 2 (mild): Secondary | ICD-10-CM | POA: Diagnosis not present

## 2019-02-23 DIAGNOSIS — J9621 Acute and chronic respiratory failure with hypoxia: Secondary | ICD-10-CM

## 2019-02-23 DIAGNOSIS — J449 Chronic obstructive pulmonary disease, unspecified: Secondary | ICD-10-CM

## 2019-02-23 NOTE — Progress Notes (Signed)
[place] NOTE  PULMONARY SERVICE ROUNDS  Date of Service: 02/23/2019  Lindsey Collins  DOB: 1946-07-25  Referring physician: [name], MD  HPI: Lindsey Collins is a 73 y.o. female  being seen for Acute on Chronic Respiratory Failure.  Patient is on the ventilator full support has not been weaned today remains on assist control mode  Review of Systems: Unremarkable other than noted in HPI  Allergies:  Reviewed on the [facility]  Medications: Reviewed  Vitals: Temperature 97.5 pulse 135 respiratory rate 24 blood pressure 99/76 saturations 97%  Ventilator Settings: Mode ventilation assist control FiO2 30% tidal line 400 PEEP 5  Physical Exam: . General:  calm and comfortable NAD . Eyes: normal lids, irises & conjunctiva . ENT: grossly normal tongue not enlarged . Neck: no masses . Cardiovascular: S1 S2 Normal no rubs no gallop . Respiratory: Coarse breath sounds no rhonchi . Abdomen: soft non-distended . Skin: no rash seen on limited exam . Musculoskeletal:  no rigidity . Psychiatric: unable to assess . Neurologic: no involuntary movements          Lab Data and radiological Data:  Sodium 143 potassium 4.5 BUN 20 creatinine 0.4 White count 12.5 hemoglobin 7.2 hematocrit 24.7 platelet count 402   Assessment/Plan  Patient Active Problem List   Diagnosis Date Noted  . Acute on chronic respiratory failure with hypoxia ([place])   . MRSA (methicillin resistant staphylococcus aureus) pneumonia ([place])   . MRSA (methicillin resistant staphylococcus aureus) pneumonia ([place])   . ARDS (adult respiratory distress syndrome) ([place])   . Obstructive chronic bronchitis without exacerbation ([place])   . Chronic kidney disease, stage II (mild)       1. Acute on chronic respiratory failure with hypoxia we will continue with full support on the ventilator.  Continue to check spontaneous breathing trials if patient able to tolerate 2. MRSA pneumonia treated on  antibiotics 3. ARDS clinically resolved we will continue to follow 4. COPD at baseline continue supportive care 5. Chronic kidney disease resolved   I have personally evaluated the patient, evaluated the laboratory and imaging results and formulated the assessment and plan and placed orders as needed. The Patient requires high complexity decision making for assessment and support. I have discussed the patient on rounds with the Respiratory Staff   Allyne Gee, MD [facility] Pulmonary Critical Care Medicine

## 2019-02-24 ENCOUNTER — Other Ambulatory Visit (HOSPITAL_COMMUNITY): Payer: Medicare Other | Admitting: Internal Medicine

## 2019-02-24 DIAGNOSIS — J15212 Pneumonia due to Methicillin resistant Staphylococcus aureus: Secondary | ICD-10-CM | POA: Diagnosis not present

## 2019-02-24 DIAGNOSIS — J80 Acute respiratory distress syndrome: Secondary | ICD-10-CM

## 2019-02-24 DIAGNOSIS — J9621 Acute and chronic respiratory failure with hypoxia: Secondary | ICD-10-CM | POA: Diagnosis not present

## 2019-02-24 DIAGNOSIS — N182 Chronic kidney disease, stage 2 (mild): Secondary | ICD-10-CM

## 2019-02-24 DIAGNOSIS — J4489 Other specified chronic obstructive pulmonary disease: Secondary | ICD-10-CM

## 2019-02-24 DIAGNOSIS — J449 Chronic obstructive pulmonary disease, unspecified: Secondary | ICD-10-CM

## 2019-02-24 NOTE — Progress Notes (Signed)
[place] NOTE  PULMONARY SERVICE ROUNDS  Date of Service: 02/24/2019  Lindsey Collins  DOB: [birth year]  Referring physician: [name], MD  HPI: Lindsey Collins is a 73 y.o. female  being seen for Acute on Chronic Respiratory Failure.  Patient currently is on full support on the ventilator.  Chest x-ray was showing left lower lobe infiltrate  Review of Systems: Unremarkable other than noted in HPI  Allergies:  Reviewed on the [facility]  Medications: Reviewed  Vitals: Temperature 97.9 pulse 140 respiratory rate 18 blood pressure 126/68 saturations 96%  Ventilator Settings: Mode of ventilation assist control FiO2 30% tidal volume 400 PEEP 5  Physical Exam: . General:  calm and comfortable NAD . Eyes: normal lids, irises & conjunctiva . ENT: grossly normal tongue not enlarged . Neck: no masses . Cardiovascular: S1 S2 Normal no rubs no gallop . Respiratory: Scattered rhonchi expansion is equal . Abdomen: soft non-distended . Skin: no rash seen on limited exam . Musculoskeletal:  no rigidity . Psychiatric: unable to assess . Neurologic: no involuntary movements          Lab Data and radiological Data:  Sodium 141 potassium 4.4 BUN 21 creatinine 0.4 White count 12.5 hemoglobin 7.4 hematocrit 25.9 platelet count 414   EXAM: Portable chest   INDICATION: Pneumonia, follow-up  COMPARISON: 02/19/2019  TECHNIQUE: AP portable view of the chest was performed.  FINDINGS: Tracheostomy tube remains in stable position. Right-sided PICC line also is stable. The left basilar infiltrate has progressed with some decreased aeration of the left upper lobe as well. Right lung is also less well aerated secondary to poor inspiration. No new infiltrates. Heart size and mediastinal contours are stable. No pneumothorax. Cannot exclude a left effusion.  IMPRESSION:  Decreased lung volumes bilaterally. Left lower lobe infiltrate may have mildly progressed  and there is decreased aeration of the left upper lobe possibly representing early developing infiltrate or atelectasis. Recommend follow-up. Stable right PICC line and tracheostomy tube.   Electronically Signed by: Lars Pinks, MD, [facility] Electronically Signed on: 02/24/2019 9:33 AM  Assessment/Plan  Patient Active Problem List   Diagnosis Date Noted  . Acute on chronic respiratory failure with hypoxia ([place])   . MRSA (methicillin resistant staphylococcus aureus) pneumonia ([place])   . MRSA (methicillin resistant staphylococcus aureus) pneumonia ([place])   . ARDS (adult respiratory distress syndrome) ([place])   . Obstructive chronic bronchitis without exacerbation ([place])   . Chronic kidney disease, stage II (mild)       1. Acute on chronic respiratory failure with hypoxia we will continue with T collar trials continue secretion management supportive care. 2. MRSA pneumonia treated we will continue present management 3. ARDS at baseline 4. Obstructive chronic bronchitis unchanged 5. Chronic kidney disease labs have shown resolution   I have personally evaluated the patient, evaluated the laboratory and imaging results and formulated the assessment and plan and placed orders as needed. The Patient requires high complexity decision making for assessment and support. I have discussed the patient on rounds with the Respiratory Staff   Allyne Gee, MD [facility] Pulmonary Critical Care Medicine

## 2019-02-26 ENCOUNTER — Other Ambulatory Visit (HOSPITAL_COMMUNITY): Payer: Medicare Other | Admitting: Internal Medicine

## 2019-02-26 DIAGNOSIS — N182 Chronic kidney disease, stage 2 (mild): Secondary | ICD-10-CM | POA: Diagnosis not present

## 2019-02-26 DIAGNOSIS — J9621 Acute and chronic respiratory failure with hypoxia: Secondary | ICD-10-CM

## 2019-02-26 DIAGNOSIS — J80 Acute respiratory distress syndrome: Secondary | ICD-10-CM | POA: Diagnosis not present

## 2019-02-26 DIAGNOSIS — J15212 Pneumonia due to Methicillin resistant Staphylococcus aureus: Secondary | ICD-10-CM

## 2019-02-26 DIAGNOSIS — J449 Chronic obstructive pulmonary disease, unspecified: Secondary | ICD-10-CM

## 2019-02-26 NOTE — Progress Notes (Signed)
[place] NOTE  PULMONARY SERVICE ROUNDS  Date of Service: 02/26/2019  Lindsey Collins  DOB: 10-06-1945  Referring physician: [name], MD  HPI: Lindsey Collins is a 73 y.o. female  being seen for Acute on Chronic Respiratory Failure.  Patient remains on the ventilator full support not weaning today right now is on assist control mode  Review of Systems: Unremarkable other than noted in HPI  Allergies:  Reviewed on the [facility]  Medications: Reviewed  Vitals: Temperature 97.9 pulse 74 respiratory 16 blood pressure 97/62 saturations 97%  Ventilator Settings: Mode of ventilation assist control FiO2 30% tidal volume 347 PEEP 5  Physical Exam: . General:  calm and comfortable NAD . Eyes: normal lids, irises & conjunctiva . ENT: grossly normal tongue not enlarged . Neck: no masses . Cardiovascular: S1 S2 Normal no rubs no gallop . Respiratory: No rhonchi coarse breath sounds . Abdomen: soft non-distended . Skin: no rash seen on limited exam . Musculoskeletal:  no rigidity . Psychiatric: unable to assess . Neurologic: no involuntary movements          Lab Data and radiological Data:  No labs to report   Assessment/Plan  Patient Active Problem List   Diagnosis Date Noted  . Acute on chronic respiratory failure with hypoxia ([place])   . MRSA (methicillin resistant staphylococcus aureus) pneumonia ([place])   . MRSA (methicillin resistant staphylococcus aureus) pneumonia ([place])   . ARDS (adult respiratory distress syndrome) ([place])   . Obstructive chronic bronchitis without exacerbation ([employer])   . Chronic kidney disease, stage II (mild)       1. Acute on chronic respiratory failure hypoxia we will continue with full support on assist control currently on 30% FiO2 2. MRSA pneumonia treated clinically. 3. ARDS patient's remains on the ventilator we will continue to monitor radiologically 4. Obstructive chronic bronchitis at baseline 5. Chronic  kidney disease stage III follow labs   I have personally evaluated the patient, evaluated the laboratory and imaging results and formulated the assessment and plan and placed orders as needed. The Patient requires high complexity decision making for assessment and support. I have discussed the patient on rounds with the Respiratory Staff   Allyne Gee, MD [facility] Pulmonary Critical Care Medicine

## 2019-02-27 ENCOUNTER — Other Ambulatory Visit (HOSPITAL_COMMUNITY): Payer: Medicare Other | Admitting: Internal Medicine

## 2019-02-27 DIAGNOSIS — J80 Acute respiratory distress syndrome: Secondary | ICD-10-CM

## 2019-02-27 DIAGNOSIS — N182 Chronic kidney disease, stage 2 (mild): Secondary | ICD-10-CM | POA: Diagnosis not present

## 2019-02-27 DIAGNOSIS — J15212 Pneumonia due to Methicillin resistant Staphylococcus aureus: Secondary | ICD-10-CM

## 2019-02-27 DIAGNOSIS — J9621 Acute and chronic respiratory failure with hypoxia: Secondary | ICD-10-CM

## 2019-02-27 DIAGNOSIS — J449 Chronic obstructive pulmonary disease, unspecified: Secondary | ICD-10-CM

## 2019-02-27 NOTE — Progress Notes (Signed)
[place] NOTE  PULMONARY SERVICE ROUNDS  Date of Service: 02/27/2019  Lindsey Collins  DOB: 05-29-1946  Referring physician: [name], MD  HPI: Lindsey Collins is a 73 y.o. female  being seen for Acute on Chronic Respiratory Failure.  Patient is on full support remains on assist control mode currently is on 30% FiO2 mechanics have been poor not able to do any weaning right now  Review of Systems: Unremarkable other than noted in HPI  Allergies:  Reviewed on the [facility]  Medications: Reviewed  Vitals: Temperature 97.9 pulse 74 respiratory rate 16 blood pressure 97/72 saturations 97%  Ventilator Settings: Mode of ventilation assist control FiO2 30% tidal volume is 412 PEEP 5  Physical Exam: . General:  calm and comfortable NAD . Eyes: normal lids, irises & conjunctiva . ENT: grossly normal tongue not enlarged . Neck: no masses . Cardiovascular: S1 S2 Normal no rubs no gallop . Respiratory: Scattered rhonchi expansion is equal at this time . Abdomen: soft non-distended . Skin: no rash seen on limited exam . Musculoskeletal:  no rigidity . Psychiatric: unable to assess . Neurologic: no involuntary movements          Lab Data and radiological Data:  *Study: XR CHEST SINGLE VIEW PORTABLE  Comparison: 02/24/2019  Indication: Other ( add clinical information to comment box below), pneumonia  Findings and impression: 1. Stable support apparatus. 2. Persistent but decreased airspace opacities in the left lung, greatest at the left base. Decreased small left pleural effusion. Low lung volumes on the right with bronchovascular crowding and likely mild right basilar atelectasis versus an additional site of infection.  Electronically Signed by: Neta Mends, MD, [facility] Radiology Electronically Signed on: 02/27/2019 10:13 AM  White count 11.6 hemoglobin 8.2 hematocrit 27.9 platelet count 469 Sodium 140 potassium 4.0 BUN 20 creatinine  0.6   Assessment/Plan  Patient Active Problem List   Diagnosis Date Noted  . Acute on chronic respiratory failure with hypoxia ([place])   . MRSA (methicillin resistant staphylococcus aureus) pneumonia ([place])   . MRSA (methicillin resistant staphylococcus aureus) pneumonia ([place])   . ARDS (adult respiratory distress syndrome) ([place])   . Obstructive chronic bronchitis without exacerbation ([place])   . Chronic kidney disease, stage II (mild)       1. Acute on chronic respiratory failure with hypoxia at this time patient is on full support on the ventilator.  I had a lengthy discussion with the patient's daughter and currently the patient's daughter wants everything to be done including CPR and shocking in the event of a cardiac arrest.  She also is not at this point ready to make a decision about withdrawal on the ventilator.  I also did explain to her that there is a good chance that the patient may not wean off the ventilator and she would have to go to a skilled nursing facility she understood this and she is in agreement if that is the case. 2. MRSA pneumonia last chest x-ray shows very very slight improvement in the basilar infiltrate we will continue to monitor. 3. ARDS clinically resolved we will continue with supportive care 4. COPD advanced disease continue present management 5. Chronic kidney disease follow labs 6. Parkinson's disease at baseline overall prognosis guarded   I have personally evaluated the patient, evaluated the laboratory and imaging results and formulated the assessment and plan and placed orders as needed. The Patient requires high complexity decision making for assessment and support. I have discussed  the patient on rounds with the Respiratory Staff   Allyne Gee, MD [facility] Pulmonary Critical Care Medicine

## 2019-02-28 ENCOUNTER — Other Ambulatory Visit (HOSPITAL_COMMUNITY): Payer: Medicare Other | Admitting: Internal Medicine

## 2019-02-28 DIAGNOSIS — J9621 Acute and chronic respiratory failure with hypoxia: Secondary | ICD-10-CM | POA: Diagnosis not present

## 2019-02-28 DIAGNOSIS — N182 Chronic kidney disease, stage 2 (mild): Secondary | ICD-10-CM

## 2019-02-28 DIAGNOSIS — J80 Acute respiratory distress syndrome: Secondary | ICD-10-CM | POA: Diagnosis not present

## 2019-02-28 DIAGNOSIS — J15212 Pneumonia due to Methicillin resistant Staphylococcus aureus: Secondary | ICD-10-CM

## 2019-02-28 DIAGNOSIS — J449 Chronic obstructive pulmonary disease, unspecified: Secondary | ICD-10-CM

## 2019-02-28 NOTE — Progress Notes (Signed)
[name] NOTE  PULMONARY SERVICE ROUNDS  Date of Service: 02/28/2019  Lindsey Collins  DOB: 06-Mar-1946  Referring physician: [name], MD  HPI: Lindsey Collins is a 73 y.o. female  being seen for Acute on Chronic Respiratory Failure.  Patient currently is on full support on assist control mode has been on 30% FiO2  Review of Systems: Unremarkable other than noted in HPI  Allergies:  Reviewed on the [facility]  Medications: Reviewed  Vitals: Temperature 97.1 pulse 72 respiratory 15 blood pressure 119/32 saturations is 100%  Ventilator Settings: On assist control FiO2 30% tidal line 398 PEEP 5  Physical Exam: . General:  calm and comfortable NAD . Eyes: normal lids, irises & conjunctiva . ENT: grossly normal tongue not enlarged . Neck: no masses . Cardiovascular: S1 S2 Normal no rubs no gallop . Respiratory: Scattered rhonchi no rales . Abdomen: soft non-distended . Skin: no rash seen on limited exam . Musculoskeletal:  no rigidity . Psychiatric: unable to assess . Neurologic: no involuntary movements          Lab Data and radiological Data:  No labs to report   Assessment/Plan  Patient Active Problem List   Diagnosis Date Noted  . Acute on chronic respiratory failure with hypoxia ([place])   . MRSA (methicillin resistant staphylococcus aureus) pneumonia ([place])   . MRSA (methicillin resistant staphylococcus aureus) pneumonia ([place])   . ARDS (adult respiratory distress syndrome) ([place])   . Obstructive chronic bronchitis without exacerbation ([place])   . Chronic kidney disease, stage II (mild)       1. Acute on chronic respiratory failure with hypoxia as noted previously spoke with the daughter they would like everything to be done aggressively so we will continue with full supportive care 2. MRSA pneumonia treated continue with present management. 3. ARDS patient is at baseline we will follow along the radiological appearance has  improved 4. Obstructive chronic bronchitis patient is at baseline 5. Chronic kidney disease follow labs   I have personally evaluated the patient, evaluated the laboratory and imaging results and formulated the assessment and plan and placed orders as needed. The Patient requires high complexity decision making for assessment and support. I have discussed the patient on rounds with the Respiratory Staff   Allyne Gee, MD [facility] Pulmonary Critical Care Medicine

## 2019-03-01 ENCOUNTER — Other Ambulatory Visit (HOSPITAL_COMMUNITY): Payer: Medicare Other | Admitting: Internal Medicine

## 2019-03-01 DIAGNOSIS — N182 Chronic kidney disease, stage 2 (mild): Secondary | ICD-10-CM | POA: Diagnosis not present

## 2019-03-01 DIAGNOSIS — J80 Acute respiratory distress syndrome: Secondary | ICD-10-CM | POA: Diagnosis not present

## 2019-03-01 DIAGNOSIS — J9621 Acute and chronic respiratory failure with hypoxia: Secondary | ICD-10-CM

## 2019-03-01 DIAGNOSIS — J15212 Pneumonia due to Methicillin resistant Staphylococcus aureus: Secondary | ICD-10-CM | POA: Diagnosis not present

## 2019-03-01 DIAGNOSIS — J449 Chronic obstructive pulmonary disease, unspecified: Secondary | ICD-10-CM

## 2019-03-01 NOTE — Progress Notes (Signed)
[place] NOTE  PULMONARY SERVICE ROUNDS  Date of Service: 03/01/2019  ROSALITA CAREY  DOB: October 21, 1945  Referring physician: [name], MD  HPI: Lindsey Collins is a 73 y.o. female  being seen for Acute on Chronic Respiratory Failure.  Patient is currently on full support with assist control mode has been on 35% FiO2 patient had an attempted spontaneous breathing trial which she failed  Review of Systems: Unremarkable other than noted in HPI  Allergies:  Reviewed on the [facility]  Medications: Reviewed  Vitals: Temperature 97.8 pulse 58 respiratory rate 17 blood pressure 119/4 saturations 99%  Ventilator Settings: Mode of ventilation assist control FiO2 is 35% tidal volume 400 PEEP 5  Physical Exam: . General:  calm and comfortable NAD . Eyes: normal lids, irises & conjunctiva . ENT: grossly normal tongue not enlarged . Neck: no masses . Cardiovascular: S1 S2 Normal no rubs no gallop . Respiratory: No rhonchi no rales are noted at this time . Abdomen: soft non-distended . Skin: no rash seen on limited exam . Musculoskeletal:  no rigidity . Psychiatric: unable to assess . Neurologic: no involuntary movements          Lab Data and radiological Data:  Sodium 136 potassium 3.8 BUN 21 creatinine 0.5 White count 8.7 hemoglobin 8.3 hematocrit 28.2 platelet count 433   Assessment/Plan  Patient Active Problem List   Diagnosis Date Noted  . Acute on chronic respiratory failure with hypoxia ([place])   . MRSA (methicillin resistant staphylococcus aureus) pneumonia ([place])   . MRSA (methicillin resistant staphylococcus aureus) pneumonia ([place])   . ARDS (adult respiratory distress syndrome) ([place])   . Obstructive chronic bronchitis without exacerbation ([place])   . Chronic kidney disease, stage II (mild)       1. Acute on chronic respiratory failure with hypoxia we will continue with full support patient was attempted to wean 3 and failed 2. MRSA  pneumonia treated we will continue with supportive care 3. ARDS clinically resolved 4. Obstructive chronic bronchitis stable at this time 5. Chronic kidney disease labs have normalized   I have personally evaluated the patient, evaluated the laboratory and imaging results and formulated the assessment and plan and placed orders as needed. The Patient requires high complexity decision making for assessment and support. I have discussed the patient on rounds with the Respiratory Staff   Allyne Gee, MD [facility] Pulmonary Critical Care Medicine

## 2019-03-03 ENCOUNTER — Other Ambulatory Visit (HOSPITAL_COMMUNITY): Payer: Medicare Other | Admitting: Internal Medicine

## 2019-03-03 DIAGNOSIS — J15212 Pneumonia due to Methicillin resistant Staphylococcus aureus: Secondary | ICD-10-CM | POA: Diagnosis not present

## 2019-03-03 DIAGNOSIS — J9621 Acute and chronic respiratory failure with hypoxia: Secondary | ICD-10-CM | POA: Diagnosis not present

## 2019-03-03 DIAGNOSIS — J449 Chronic obstructive pulmonary disease, unspecified: Secondary | ICD-10-CM

## 2019-03-03 DIAGNOSIS — J80 Acute respiratory distress syndrome: Secondary | ICD-10-CM

## 2019-03-03 DIAGNOSIS — N182 Chronic kidney disease, stage 2 (mild): Secondary | ICD-10-CM | POA: Diagnosis not present

## 2019-03-03 NOTE — Progress Notes (Signed)
[place] NOTE  PULMONARY SERVICE ROUNDS  Date of Service: 03/03/2019  Lindsey Collins  DOB: 1945/12/24  Referring physician: [name], MD  HPI: Lindsey Collins is a 73 y.o. female  being seen for Acute on Chronic Respiratory Failure.  Patient currently using pressure support mode has been on 30% FiO2  Review of Systems: Unremarkable other than noted in HPI  Allergies:  Reviewed on the [facility]  Medications: Reviewed  Vitals: Temperature 97.3 pulse 69 respiratory rate 22 blood pressure 110/60 saturations 100%  Ventilator Settings: Mode of ventilation pressure support FiO2 30% tidal volume 280 pressure support 12/5  Physical Exam: . General:  calm and comfortable NAD . Eyes: normal lids, irises & conjunctiva . ENT: grossly normal tongue not enlarged . Neck: no masses . Cardiovascular: S1 S2 Normal no rubs no gallop . Respiratory: No rhonchi coarse breath sounds . Abdomen: soft non-distended . Skin: no rash seen on limited exam . Musculoskeletal:  no rigidity . Psychiatric: unable to assess . Neurologic: no involuntary movements          Lab Data and radiological Data:  No labs to report   Assessment/Plan  Patient Active Problem List   Diagnosis Date Noted  . Acute on chronic respiratory failure with hypoxia ([place])   . MRSA (methicillin resistant staphylococcus aureus) pneumonia ([place])   . MRSA (methicillin resistant staphylococcus aureus) pneumonia ([place])   . ARDS (adult respiratory distress syndrome) ([place])   . Obstructive chronic bronchitis without exacerbation ([place])   . Chronic kidney disease, stage II (mild)       1. Acute on chronic respiratory failure with hypoxia patient was started on weaning protocol but did not tolerate.  The patient had to be placed back on a pressure support of 12/5 as she did not tolerate the spontaneous breathing trial 5/5 or 8/8 2. MRSA pneumonia treated we will continue support follow  radiologically 3. ARDS clinically resolved 4. COPD unknown severity continue present management 5. Chronic kidney disease "follow-up on labs   I have personally evaluated the patient, evaluated the laboratory and imaging results and formulated the assessment and plan and placed orders as needed. The Patient requires high complexity decision making for assessment and support. I have discussed the patient on rounds with the Respiratory Staff   Allyne Gee, MD [facility] Pulmonary Critical Care Medicine

## 2019-03-05 ENCOUNTER — Other Ambulatory Visit (HOSPITAL_COMMUNITY): Payer: Medicare Other | Admitting: Internal Medicine

## 2019-03-05 DIAGNOSIS — J80 Acute respiratory distress syndrome: Secondary | ICD-10-CM

## 2019-03-05 DIAGNOSIS — N182 Chronic kidney disease, stage 2 (mild): Secondary | ICD-10-CM | POA: Diagnosis not present

## 2019-03-05 DIAGNOSIS — J15212 Pneumonia due to Methicillin resistant Staphylococcus aureus: Secondary | ICD-10-CM | POA: Diagnosis not present

## 2019-03-05 DIAGNOSIS — J9621 Acute and chronic respiratory failure with hypoxia: Secondary | ICD-10-CM

## 2019-03-05 DIAGNOSIS — J449 Chronic obstructive pulmonary disease, unspecified: Secondary | ICD-10-CM

## 2019-03-05 NOTE — Progress Notes (Signed)
[place] NOTE  PULMONARY SERVICE ROUNDS  Date of Service: 03/05/2019  Lindsey Collins  DOB: Dec 06, 1945  Referring physician: [name], MD  HPI: Lindsey Collins is a 73 y.o. female  being seen for Acute on Chronic Respiratory Failure.  Patient continues to fail weaning attempts were now is on full support on assist control mode has been on 30% FiO2  Review of Systems: Unremarkable other than noted in HPI  Allergies:  Reviewed on the [facility]  Medications: Reviewed  Vitals: Temperature 98.0 pulse 63 respiratory rate 20 blood pressure 100/62 saturations 98%  Ventilator Settings: Mode of ventilation assist control FiO2 30% tidal volume is 400 PEEP 5  Physical Exam: . General:  calm and comfortable NAD . Eyes: normal lids, irises & conjunctiva . ENT: grossly normal tongue not enlarged . Neck: no masses . Cardiovascular: S1 S2 Normal no rubs no gallop . Respiratory: No rhonchi no rales are noted . Abdomen: soft non-distended . Skin: no rash seen on limited exam . Musculoskeletal:  no rigidity . Psychiatric: unable to assess . Neurologic: no involuntary movements          Lab Data and radiological Data:  Sodium 138 potassium 3.2 BUN 29 creatinine 0.7 White count 10.6 hemoglobin 8.1 hematocrit 27 platelet count 291   Assessment/Plan  Patient Active Problem List   Diagnosis Date Noted  . Acute on chronic respiratory failure with hypoxia ([place])   . MRSA (methicillin resistant staphylococcus aureus) pneumonia ([place])   . MRSA (methicillin resistant staphylococcus aureus) pneumonia ([place])   . ARDS (adult respiratory distress syndrome) ([place])   . Obstructive chronic bronchitis without exacerbation ([place])   . Chronic kidney disease, stage II (mild)       1. Acute on chronic respiratory failure with hypoxia patient continues to fail attempts at weaning on assist control mode right now on 30% FiO2 patient's tidal volume is 400 with a PEEP of  5 2. MRSA pneumonia treated we will continue with supportive care 3. ARDS patient is at baseline now continue to monitor 4. Obstructive chronic bronchitis at baseline 5. Chronic kidney disease stage III resolved   I have personally evaluated the patient, evaluated the laboratory and imaging results and formulated the assessment and plan and placed orders as needed. The Patient requires high complexity decision making for assessment and support. I have discussed the patient on rounds with the Respiratory Staff   Allyne Gee, MD [facility] Pulmonary Critical Care Medicine

## 2019-03-12 ENCOUNTER — Other Ambulatory Visit (HOSPITAL_COMMUNITY): Payer: Medicare Other | Admitting: Internal Medicine

## 2019-03-12 DIAGNOSIS — J80 Acute respiratory distress syndrome: Secondary | ICD-10-CM

## 2019-03-12 DIAGNOSIS — J15212 Pneumonia due to Methicillin resistant Staphylococcus aureus: Secondary | ICD-10-CM | POA: Diagnosis not present

## 2019-03-12 DIAGNOSIS — J449 Chronic obstructive pulmonary disease, unspecified: Secondary | ICD-10-CM

## 2019-03-12 DIAGNOSIS — J9621 Acute and chronic respiratory failure with hypoxia: Secondary | ICD-10-CM

## 2019-03-12 DIAGNOSIS — N182 Chronic kidney disease, stage 2 (mild): Secondary | ICD-10-CM

## 2019-03-12 NOTE — Progress Notes (Signed)
[place] NOTE  PULMONARY SERVICE ROUNDS  Date of Service: 03/12/2019  Lindsey Collins  DOB: 06-16-1946  Referring physician: [name], MD  HPI: ADDI PAK is a 73 y.o. female  being seen for Acute on Chronic Respiratory Failure.  Patient currently is on full support on pressure support mode has been on 30% FiO2 16/5  Review of Systems: Unremarkable other than noted in HPI  Allergies:  Reviewed on the Lindsey Collins  Medications: Reviewed  Vitals: Temperature 98.0 pulse 73 respiratory rate 20 blood pressure 120/70 saturations 99%  Ventilator Settings: Mode ventilation pressure support FiO2 30% pressure support 16 PEEP 5 tidal volume 378  Physical Exam: . General:  calm and comfortable NAD . Eyes: normal lids, irises & conjunctiva . ENT: grossly normal tongue not enlarged . Neck: no masses . Cardiovascular: S1 S2 Normal no rubs no gallop . Respiratory: No rhonchi coarse breath sounds . Abdomen: soft non-distended . Skin: no rash seen on limited exam . Musculoskeletal:  no rigidity . Psychiatric: unable to assess . Neurologic: no involuntary movements          Lab Data and radiological Data:  No labs to report   Assessment/Plan  Patient Active Problem List   Diagnosis Date Noted  . Acute on chronic respiratory failure with hypoxia (Lindsey Collins)   . MRSA (methicillin resistant staphylococcus aureus) pneumonia (Lindsey Collins)   . MRSA (methicillin resistant staphylococcus aureus) pneumonia (Lindsey Collins)   . ARDS (adult respiratory distress syndrome) (Lindsey Collins)   . Obstructive chronic bronchitis without exacerbation (Lindsey Collins)   . Chronic kidney disease, stage II (mild)       1. Acute on chronic respiratory failure with hypoxia we will try to continue to wean patient is on pressure support now should be able to try T collar 2. MRSA pneumonia treated we will continue with supportive care 3. ARDS clinically resolved we will monitor 4. COPD at baseline continue present  management 5. Chronic kidney disease follow-up labs   I have personally evaluated the patient, evaluated the laboratory and imaging results and formulated the assessment and plan and placed orders as needed. The Patient requires high complexity decision making for assessment and support. I have discussed the patient on rounds with the Respiratory Staff   Lindsey Gee, MD [facility] Pulmonary Critical Care Medicine

## 2019-03-14 ENCOUNTER — Other Ambulatory Visit (HOSPITAL_COMMUNITY): Payer: Medicare Other | Admitting: Internal Medicine

## 2019-03-14 DIAGNOSIS — J15212 Pneumonia due to Methicillin resistant Staphylococcus aureus: Secondary | ICD-10-CM

## 2019-03-14 DIAGNOSIS — J9621 Acute and chronic respiratory failure with hypoxia: Secondary | ICD-10-CM | POA: Diagnosis not present

## 2019-03-14 DIAGNOSIS — J80 Acute respiratory distress syndrome: Secondary | ICD-10-CM

## 2019-03-14 DIAGNOSIS — J449 Chronic obstructive pulmonary disease, unspecified: Secondary | ICD-10-CM

## 2019-03-14 DIAGNOSIS — N182 Chronic kidney disease, stage 2 (mild): Secondary | ICD-10-CM | POA: Diagnosis not present

## 2019-03-14 NOTE — Progress Notes (Signed)
[place] NOTE  PULMONARY SERVICE ROUNDS  Date of Service: 03/14/2019  Lindsey Collins  DOB: 02-Dec-1945  Referring physician: [name], MD  HPI: Lindsey Collins is a 73 y.o. female  being seen for Acute on Chronic Respiratory Failure.  Remains on full support on pressure control mode has been on 30% FiO2  Review of Systems: Unremarkable other than noted in HPI  Allergies:  Reviewed on the [facility]  Medications: Reviewed  Vitals: Temperature 96.8 pulse 72 respiratory rate 26 blood pressure 154/72 saturations 98%  Ventilator Settings: Mode ventilation pressure assist control FiO2 30% tidal volume 275 PEEP 5  Physical Exam: . General:  calm and comfortable NAD . Eyes: normal lids, irises & conjunctiva . ENT: grossly normal tongue not enlarged . Neck: no masses . Cardiovascular: S1 S2 Normal no rubs no gallop . Respiratory: Scattered rhonchi expansion is equal . Abdomen: soft non-distended . Skin: no rash seen on limited exam . Musculoskeletal:  no rigidity . Psychiatric: unable to assess . Neurologic: no involuntary movements          Lab Data and radiological Data:  No labs to report   Assessment/Plan  Patient Active Problem List   Diagnosis Date Noted  . Acute on chronic respiratory failure with hypoxia ([place])   . MRSA (methicillin resistant staphylococcus aureus) pneumonia ([place])   . MRSA (methicillin resistant staphylococcus aureus) pneumonia ([place])   . ARDS (adult respiratory distress syndrome) ([place])   . Obstructive chronic bronchitis without exacerbation ([place])   . Chronic kidney disease, stage II (mild)       1. Acute on chronic respiratory failure with hypoxia we will continue with full vent support patient has not been tolerating weaning.  Continue pulmonary toilet 2. MRSA pneumonia treated 3. ARDS resolved 4. COPD nebulizers as necessary 5. Chronic kidney disease follow-up labs   I have personally evaluated the patient,  evaluated the laboratory and imaging results and formulated the assessment and plan and placed orders as needed. The Patient requires high complexity decision making for assessment and support. I have discussed the patient on rounds with the Respiratory [place] [id number] [facility] Pulmonary Critical Care Medicine

## 2019-03-15 ENCOUNTER — Other Ambulatory Visit (HOSPITAL_COMMUNITY): Payer: Medicare Other | Admitting: Internal Medicine

## 2019-03-15 DIAGNOSIS — J15212 Pneumonia due to Methicillin resistant Staphylococcus aureus: Secondary | ICD-10-CM

## 2019-03-15 DIAGNOSIS — N182 Chronic kidney disease, stage 2 (mild): Secondary | ICD-10-CM

## 2019-03-15 DIAGNOSIS — J80 Acute respiratory distress syndrome: Secondary | ICD-10-CM | POA: Diagnosis not present

## 2019-03-15 DIAGNOSIS — J9621 Acute and chronic respiratory failure with hypoxia: Secondary | ICD-10-CM | POA: Diagnosis not present

## 2019-03-15 DIAGNOSIS — J449 Chronic obstructive pulmonary disease, unspecified: Secondary | ICD-10-CM

## 2019-03-15 NOTE — Progress Notes (Signed)
[place] NOTE  PULMONARY SERVICE ROUNDS  Date of Service: 03/15/2019  HALLIE ERTL  DOB: 03-30-1946  Referring physician: [name], MD  HPI: Lindsey Collins is a 73 y.o. female  being seen for Acute on Chronic Respiratory Failure.  Patient is on full support on pressure control mode has been on 30% FiO2 inspiratory pressure of 18  Review of Systems: Unremarkable other than noted in HPI  Allergies:  Reviewed on the [facility]  Medications: Reviewed  Vitals: Temperature 99.1 pulse 75 respiratory 18 blood pressure 101/58 saturations 96%  Ventilator Settings: Mode ventilation pressure assist control FiO2 30% inspiratory pressure 18 PEEP 5  Physical Exam: . General:  calm and comfortable NAD . Eyes: normal lids, irises & conjunctiva . ENT: grossly normal tongue not enlarged . Neck: no masses . Cardiovascular: S1 S2 Normal no rubs no gallop . Respiratory: No rhonchi no rales are noted at this time . Abdomen: soft non-distended . Skin: no rash seen on limited exam . Musculoskeletal:  no rigidity . Psychiatric: unable to assess . Neurologic: no involuntary movements          Lab Data and radiological Data:  No labs to report   Assessment/Plan  Patient Active Problem List   Diagnosis Date Noted  . Acute on chronic respiratory failure with hypoxia ([place])   . MRSA (methicillin resistant staphylococcus aureus) pneumonia ([place])   . MRSA (methicillin resistant staphylococcus aureus) pneumonia ([place])   . ARDS (adult respiratory distress syndrome) ([place])   . Obstructive chronic bronchitis without exacerbation ([place])   . Chronic kidney disease, stage II (mild)       1. Acute on chronic respiratory failure with hypoxia we will continue with full vent support patient's RSB I was 210 will reassess again 2. MRSA pneumonia treated we will continue with supportive care 3. ARDS clinically resolved 4. COPD at baseline 5. Chronic kidney disease monitor  labs   I have personally evaluated the patient, evaluated the laboratory and imaging results and formulated the assessment and plan and placed orders as needed. The Patient requires high complexity decision making for assessment and support. I have discussed the patient on rounds with the Respiratory Staff   Allyne Gee, MD [facility] Pulmonary Critical Care Medicine

## 2019-03-16 ENCOUNTER — Other Ambulatory Visit (HOSPITAL_COMMUNITY): Payer: Medicare Other | Admitting: Internal Medicine

## 2019-03-16 DIAGNOSIS — J15212 Pneumonia due to Methicillin resistant Staphylococcus aureus: Secondary | ICD-10-CM

## 2019-03-16 DIAGNOSIS — J9621 Acute and chronic respiratory failure with hypoxia: Secondary | ICD-10-CM

## 2019-03-16 DIAGNOSIS — J449 Chronic obstructive pulmonary disease, unspecified: Secondary | ICD-10-CM

## 2019-03-16 DIAGNOSIS — N182 Chronic kidney disease, stage 2 (mild): Secondary | ICD-10-CM

## 2019-03-16 DIAGNOSIS — J80 Acute respiratory distress syndrome: Secondary | ICD-10-CM

## 2019-03-16 NOTE — Progress Notes (Signed)
[place] NOTE  PULMONARY SERVICE ROUNDS  Date of Service: 03/16/2019  Lindsey Collins  DOB: Jan 31, 1946  Referring physician: [name], MD  HPI: Lindsey Collins is a 73 y.o. female  being seen for Acute on Chronic Respiratory Failure.  Patient had a difficult time today with the RSB I was not able to tolerate any weaning at all  Review of Systems: Unremarkable other than noted in HPI  Allergies:  Reviewed on the [facility]  Medications: Reviewed  Vitals: Temperature 97.1 pulse 88 respiratory rate 16 blood pressure 104/68 saturations 98%  Ventilator Settings: Mode ventilation pressure assist control FiO2 30% tidal volume 390 PEEP 5  Physical Exam: . General:  calm and comfortable NAD . Eyes: normal lids, irises & conjunctiva . ENT: grossly normal tongue not enlarged . Neck: no masses . Cardiovascular: S1 S2 Normal no rubs no gallop . Respiratory: Coarse rhonchi noted bilaterally . Abdomen: soft non-distended . Skin: no rash seen on limited exam . Musculoskeletal:  no rigidity . Psychiatric: unable to assess . Neurologic: no involuntary movements          Lab Data and radiological Data:  No labs to report   Assessment/Plan  Patient Active Problem List   Diagnosis Date Noted  . Acute on chronic respiratory failure with hypoxia ([place])   . MRSA (methicillin resistant staphylococcus aureus) pneumonia ([place])   . MRSA (methicillin resistant staphylococcus aureus) pneumonia ([place])   . ARDS (adult respiratory distress syndrome) ([place])   . Obstructive chronic bronchitis without exacerbation ([place])   . Chronic kidney disease, stage II (mild)       1. Acute on chronic respiratory failure with proximal continue with full support on the ventilator failed weaning attempt today 2. MRSA pneumonia treated we will continue with supportive care 3. ARDS at baseline continue present management 4. COPD nebulizers as necessary 5. Chronic kidney disease follow  labs   I have personally evaluated the patient, evaluated the laboratory and imaging results and formulated the assessment and plan and placed orders as needed. The Patient requires high complexity decision making for assessment and support. I have discussed the patient on rounds with the Respiratory Staff   Allyne Gee, MD [facility] Pulmonary Critical Care Medicine

## 2019-03-17 ENCOUNTER — Other Ambulatory Visit (HOSPITAL_COMMUNITY): Payer: Medicare Other | Admitting: Internal Medicine

## 2019-03-17 DIAGNOSIS — J80 Acute respiratory distress syndrome: Secondary | ICD-10-CM

## 2019-03-17 DIAGNOSIS — N182 Chronic kidney disease, stage 2 (mild): Secondary | ICD-10-CM | POA: Diagnosis not present

## 2019-03-17 DIAGNOSIS — J9621 Acute and chronic respiratory failure with hypoxia: Secondary | ICD-10-CM

## 2019-03-17 DIAGNOSIS — J15212 Pneumonia due to Methicillin resistant Staphylococcus aureus: Secondary | ICD-10-CM

## 2019-03-17 DIAGNOSIS — J449 Chronic obstructive pulmonary disease, unspecified: Secondary | ICD-10-CM

## 2019-03-17 NOTE — Progress Notes (Signed)
[place] NOTE  PULMONARY SERVICE ROUNDS  Date of Service: 03/17/2019  Lindsey Collins  DOB: 07-30-1946  Referring physician: [name], MD  HPI: Lindsey Collins is a 73 y.o. female  being seen for Acute on Chronic Respiratory Failure.  Patient is on full support on pressure control mode at this time on 30% FiO2  Review of Systems: Unremarkable other than noted in HPI  Allergies:  Reviewed on the [facility]  Medications: Reviewed  Vitals: Temperature 97.6 pulse 66 respiratory 22 blood pressure 110/70 saturations 99%  Ventilator Settings: Mode ventilation pressure assist control FiO2 30% tidal line 435 PEEP 5  Physical Exam: . General:  calm and comfortable NAD . Eyes: normal lids, irises & conjunctiva . ENT: grossly normal tongue not enlarged . Neck: no masses . Cardiovascular: S1 S2 Normal no rubs no gallop . Respiratory: No rhonchi coarse breath sounds . Abdomen: soft non-distended . Skin: no rash seen on limited exam . Musculoskeletal:  no rigidity . Psychiatric: unable to assess . Neurologic: no involuntary movements          Lab Data and radiological Data:  No labs to report   Assessment/Plan  Patient Active Problem List   Diagnosis Date Noted  . Acute on chronic respiratory failure with hypoxia ([place])   . MRSA (methicillin resistant staphylococcus aureus) pneumonia ([place])   . MRSA (methicillin resistant staphylococcus aureus) pneumonia ([place])   . ARDS (adult respiratory distress syndrome) ([place])   . Obstructive chronic bronchitis without exacerbation ([place])   . Chronic kidney disease, stage II (mild)       1. Acute on chronic respiratory failure with hypoxia we will continue with full support spontaneous breathing trial attempted failed 2. MRSA pneumonia treated 3. ARDS clinically resolved 4. COPD at baseline nebulizers as necessary 5. Chronic kidney disease resolved   I have personally evaluated the patient, evaluated the  laboratory and imaging results and formulated the assessment and plan and placed orders as needed. The Patient requires high complexity decision making for assessment and support. I have discussed the patient on rounds with the Respiratory Staff   Allyne Gee, MD [facility] Pulmonary Critical Care Medicine

## 2019-03-19 ENCOUNTER — Other Ambulatory Visit (HOSPITAL_COMMUNITY): Payer: Medicare Other | Admitting: Internal Medicine

## 2019-03-19 DIAGNOSIS — J80 Acute respiratory distress syndrome: Secondary | ICD-10-CM | POA: Diagnosis not present

## 2019-03-19 DIAGNOSIS — N182 Chronic kidney disease, stage 2 (mild): Secondary | ICD-10-CM | POA: Diagnosis not present

## 2019-03-19 DIAGNOSIS — J15212 Pneumonia due to Methicillin resistant Staphylococcus aureus: Secondary | ICD-10-CM

## 2019-03-19 DIAGNOSIS — J9621 Acute and chronic respiratory failure with hypoxia: Secondary | ICD-10-CM | POA: Diagnosis not present

## 2019-03-19 DIAGNOSIS — J449 Chronic obstructive pulmonary disease, unspecified: Secondary | ICD-10-CM

## 2019-03-19 NOTE — Progress Notes (Signed)
[place] NOTE  PULMONARY SERVICE ROUNDS  Date of Service: 03/19/2019  Lindsey Collins  DOB: 09/25/1945  Referring physician: [name], MD  HPI: Lindsey Collins is a 73 y.o. female  being seen for Acute on Chronic Respiratory Failure.  Patient is on full support on assist control mode has been on 30% FiO2 with a PEEP of 5  Review of Systems: Unremarkable other than noted in HPI  Allergies:  Reviewed on the [facility]  Medications: Reviewed  Vitals: Temperature 97.9 pulse 70 respiratory rate 20 blood pressure 138/78 saturations 96%  Ventilator Settings: Mode of ventilation assist control FiO2 30% tidal volume 500 PEEP 5  Physical Exam: . General:  calm and comfortable NAD . Eyes: normal lids, irises & conjunctiva . ENT: grossly normal tongue not enlarged . Neck: no masses . Cardiovascular: S1 S2 Normal no rubs no gallop . Respiratory: No rhonchi no rales are noted at this time . Abdomen: soft non-distended . Skin: no rash seen on limited exam . Musculoskeletal:  no rigidity . Psychiatric: unable to assess . Neurologic: no involuntary movements          Lab Data and radiological Data:  No labs to report   Assessment/Plan  Patient Active Problem List   Diagnosis Date Noted  . Acute on chronic respiratory failure with hypoxia ([place])   . MRSA (methicillin resistant staphylococcus aureus) pneumonia ([place])   . MRSA (methicillin resistant staphylococcus aureus) pneumonia ([place])   . ARDS (adult respiratory distress syndrome) ([place])   . Obstructive chronic bronchitis without exacerbation ([place])   . Chronic kidney disease, stage II (mild)       1. Acute on chronic respiratory failure with hypoxia patient not been tolerating much in the way of weaning patient will be continued with assist control mode continue to check the RSB I. 2. MRSA pneumonia treated continue with supportive care 3. ARDS clinically resolved 4. Obstructive chronic bronchitis  grossly unchanged 5. Chronic kidney disease stage II follow labs   I have personally evaluated the patient, evaluated the laboratory and imaging results and formulated the assessment and plan and placed orders as needed. The Patient requires high complexity decision making for assessment and support. I have discussed the patient on rounds with the Respiratory Staff   Allyne Gee, MD [facility] Pulmonary Critical Care Medicine

## 2019-03-21 ENCOUNTER — Other Ambulatory Visit (HOSPITAL_COMMUNITY): Payer: Medicare Other | Admitting: Internal Medicine

## 2019-03-21 DIAGNOSIS — J80 Acute respiratory distress syndrome: Secondary | ICD-10-CM | POA: Diagnosis not present

## 2019-03-21 DIAGNOSIS — N182 Chronic kidney disease, stage 2 (mild): Secondary | ICD-10-CM

## 2019-03-21 DIAGNOSIS — J9621 Acute and chronic respiratory failure with hypoxia: Secondary | ICD-10-CM | POA: Diagnosis not present

## 2019-03-21 DIAGNOSIS — J15212 Pneumonia due to Methicillin resistant Staphylococcus aureus: Secondary | ICD-10-CM

## 2019-03-21 DIAGNOSIS — J449 Chronic obstructive pulmonary disease, unspecified: Secondary | ICD-10-CM

## 2019-03-21 NOTE — Progress Notes (Signed)
[place] NOTE  PULMONARY SERVICE ROUNDS  Date of Service: 03/21/2019  Lindsey Collins  DOB: 1945/12/21  Referring physician: [name], MD  HPI: Lindsey Collins is a 73 y.o. female  being seen for Acute on Chronic Respiratory Failure.  Patient currently is on full support has not been doing well as far as being able to wean in fact she is not going to be able to wean.  She is going to need placement into a skilled nursing facility.  Review of Systems: Unremarkable other than noted in HPI  Allergies:  Reviewed on the [facility]  Medications: Reviewed  Vitals: Temperature 99.2 pulse 72 respiratory 18 blood pressure 118/80 saturations 99%  Ventilator Settings: Mode ventilation pressure assist control FiO2 35% tidal volume 458 PEEP 8  Physical Exam: . General:  calm and comfortable NAD . Eyes: normal lids, irises & conjunctiva . ENT: grossly normal tongue not enlarged . Neck: no masses . Cardiovascular: S1 S2 Normal no rubs no gallop . Respiratory: No rhonchi or rales are noted at this time . Abdomen: soft non-distended . Skin: no rash seen on limited exam . Musculoskeletal:  no rigidity . Psychiatric: unable to assess . Neurologic: no involuntary movements          Lab Data and radiological Data:  Sodium 150 potassium 3.3 BUN 69 creatinine 1.5   Assessment/Plan  Patient Active Problem List   Diagnosis Date Noted  . Acute on chronic respiratory failure with hypoxia ([place])   . MRSA (methicillin resistant staphylococcus aureus) pneumonia ([place])   . MRSA (methicillin resistant staphylococcus aureus) pneumonia ([place])   . ARDS (adult respiratory distress syndrome) ([place])   . Obstructive chronic bronchitis without exacerbation ([place])   . Chronic kidney disease, stage II (mild)       1. Acute on chronic respiratory failure with hypoxia we will continue with full vent support at this time patient is not tolerating weaning at this stage. 2. MRSA  pneumonia treated we will continue with supportive care 3. ARDS at baseline resolved 4. Chronic obstructive bronchitis patient is at baseline nebulizers as necessary 5. Chronic kidney disease labs as noted above   I have personally evaluated the patient, evaluated the laboratory and imaging results and formulated the assessment and plan and placed orders as needed. The Patient requires high complexity decision making for assessment and support. I have discussed the patient on rounds with the Respiratory Staff   Allyne Gee, MD [facility] Pulmonary Critical Care Medicine

## 2019-03-22 ENCOUNTER — Other Ambulatory Visit (HOSPITAL_COMMUNITY): Payer: Medicare Other | Admitting: Internal Medicine

## 2019-03-22 DIAGNOSIS — J15212 Pneumonia due to Methicillin resistant Staphylococcus aureus: Secondary | ICD-10-CM

## 2019-03-22 DIAGNOSIS — J9621 Acute and chronic respiratory failure with hypoxia: Secondary | ICD-10-CM

## 2019-03-22 DIAGNOSIS — J80 Acute respiratory distress syndrome: Secondary | ICD-10-CM | POA: Diagnosis not present

## 2019-03-22 DIAGNOSIS — N182 Chronic kidney disease, stage 2 (mild): Secondary | ICD-10-CM | POA: Diagnosis not present

## 2019-03-22 DIAGNOSIS — J449 Chronic obstructive pulmonary disease, unspecified: Secondary | ICD-10-CM

## 2019-03-22 NOTE — Progress Notes (Signed)
[name]  PULMONARY SERVICE ROUNDS  Date of Service: 03/22/2019  Lindsey Collins  DOB: Jan 29, 1946  Referring physician: [name], MD  HPI: Lindsey Collins is a 73 y.o. female  being seen for Acute on Chronic Respiratory Failure.  Patient remains on the ventilator full support has not been tolerating any weaning attempts.  Was also getting a MRI and EEG today  Review of Systems: Unremarkable other than noted in HPI  Allergies:  Reviewed on the [facility]  Medications: Reviewed  Vitals: Temperature 97.8 pulse 60 respiratory 20 blood pressure 110/60 saturations 100%  Ventilator Settings: Mode ventilation pressure assist control FiO2 40% inspiratory pressures 22 PEEP 8  Physical Exam: . General:  calm and comfortable NAD . Eyes: normal lids, irises & conjunctiva . ENT: grossly normal tongue not enlarged . Neck: no masses . Cardiovascular: S1 S2 Normal no rubs no gallop . Respiratory: No rhonchi no rales are noted . Abdomen: soft non-distended . Skin: no rash seen on limited exam . Musculoskeletal:  no rigidity . Psychiatric: unable to assess . Neurologic: no involuntary movements          Lab Data and radiological Data:  Sodium 145 potassium 3.3 BUN 68 creatinine 1.4   Assessment/Plan  Patient Active Problem List   Diagnosis Date Noted  . Acute on chronic respiratory failure with hypoxia ([place])   . MRSA (methicillin resistant staphylococcus aureus) pneumonia ([place])   . MRSA (methicillin resistant staphylococcus aureus) pneumonia ([place])   . ARDS (adult respiratory distress syndrome) ([place])   . Obstructive chronic bronchitis without exacerbation ([place])   . Chronic kidney disease, stage II (mild)       1. Acute on chronic respiratory failure with hypoxia we will continue with full support on the ventilator patient is not able to do any weaning 2. MRSA pneumonia treated we will continue supportive care 3. ARDS clinically resolved 4. COPD  advanced disease continue with supportive care 5. Chronic kidney disease stage II following labs   I have personally evaluated the patient, evaluated the laboratory and imaging results and formulated the assessment and plan and placed orders as needed. The Patient requires high complexity decision making for assessment and support. I have discussed the patient on rounds with the Respiratory Staff   Allyne Gee, MD [facility] Pulmonary Critical Care Medicine

## 2019-03-24 ENCOUNTER — Other Ambulatory Visit (HOSPITAL_COMMUNITY): Payer: Medicare Other | Admitting: Internal Medicine

## 2019-03-24 DIAGNOSIS — J15212 Pneumonia due to Methicillin resistant Staphylococcus aureus: Secondary | ICD-10-CM

## 2019-03-24 DIAGNOSIS — N182 Chronic kidney disease, stage 2 (mild): Secondary | ICD-10-CM | POA: Diagnosis not present

## 2019-03-24 DIAGNOSIS — J449 Chronic obstructive pulmonary disease, unspecified: Secondary | ICD-10-CM

## 2019-03-24 DIAGNOSIS — J80 Acute respiratory distress syndrome: Secondary | ICD-10-CM

## 2019-03-24 DIAGNOSIS — J9621 Acute and chronic respiratory failure with hypoxia: Secondary | ICD-10-CM

## 2019-03-24 NOTE — Progress Notes (Signed)
[name] NOTE  PULMONARY SERVICE ROUNDS  Date of Service: 03/24/2019  [name]  DOB: 1946/08/23  Referring physician: [name], MD  HPI: Lindsey Collins is a 73 y.o. female  being seen for Acute on Chronic Respiratory Failure.  Patient right now is on full support and pressure control mode has been on 45% FiO2 with a PEEP of 10  Review of Systems: Unremarkable other than noted in HPI  Allergies:  Reviewed on the [facility]  Medications: Reviewed  Vitals: Temperature 99.6 pulse 74 respiratory 24 blood pressure 95/51 saturations 100%  Ventilator Settings: Mode ventilation pressure assist control FiO2 45% tidal volume 398 PEEP 10  Physical Exam: . General:  calm and comfortable NAD . Eyes: normal lids, irises & conjunctiva . ENT: grossly normal tongue not enlarged . Neck: no masses . Cardiovascular: S1 S2 Normal no rubs no gallop . Respiratory: No rhonchi no rales are noted at this time . Abdomen: soft non-distended . Skin: no rash seen on limited exam . Musculoskeletal:  no rigidity . Psychiatric: unable to assess . Neurologic: no involuntary movements          Lab Data and radiological Data:  Sodium 150 potassium 2.9 BUN 70 creatinine 1.4 White count 10.1 hemoglobin 10.2 hematocrit 35.4 platelet count 301   Assessment/Plan  Patient Active Problem List   Diagnosis Date Noted  . Acute on chronic respiratory failure with hypoxia ([place])   . MRSA (methicillin resistant staphylococcus aureus) pneumonia ([place])   . MRSA (methicillin resistant staphylococcus aureus) pneumonia ([place])   . ARDS (adult respiratory distress syndrome) ([place])   . Obstructive chronic bronchitis without exacerbation ([place])   . Chronic kidney disease, stage II (mild)       1. Acute on chronic respiratory failure with hypoxia we will continue with full support on the ventilator patient is not able to wean 2. MRSA pneumonia treated follow radiologically 3. ARDS  clinically improved 4. COPD advanced disease continue with supportive care 5. Chronic kidney disease stage III we will continue with present management   I have personally evaluated the patient, evaluated the laboratory and imaging results and formulated the assessment and plan and placed orders as needed. The Patient requires high complexity decision making for assessment and support. I have discussed the patient on rounds with the Respiratory Staff   Allyne Gee, MD [facility] Pulmonary Critical Care Medicine

## 2019-03-26 ENCOUNTER — Other Ambulatory Visit (HOSPITAL_COMMUNITY): Payer: Medicare Other | Admitting: Internal Medicine

## 2019-03-26 DIAGNOSIS — J9621 Acute and chronic respiratory failure with hypoxia: Secondary | ICD-10-CM | POA: Diagnosis not present

## 2019-03-26 DIAGNOSIS — J15212 Pneumonia due to Methicillin resistant Staphylococcus aureus: Secondary | ICD-10-CM | POA: Diagnosis not present

## 2019-03-26 DIAGNOSIS — J80 Acute respiratory distress syndrome: Secondary | ICD-10-CM | POA: Diagnosis not present

## 2019-03-26 DIAGNOSIS — N182 Chronic kidney disease, stage 2 (mild): Secondary | ICD-10-CM

## 2019-03-26 DIAGNOSIS — J449 Chronic obstructive pulmonary disease, unspecified: Secondary | ICD-10-CM

## 2019-03-26 NOTE — Progress Notes (Signed)
[place] NOTE  PULMONARY SERVICE ROUNDS  Date of Service: 03/26/2019  Lindsey Collins  DOB: 1946/03/30  Referring physician: [name], MD  HPI: Lindsey Collins is a 73 y.o. female  being seen for Acute on Chronic Respiratory Failure.  Patient is on the ventilator full support has been attempted at weaning with failure of the RSB I currently is on 50% FiO2 with a PEEP of 10  Review of Systems: Unremarkable other than noted in HPI  Allergies:  Reviewed on the [facility]  Medications: Reviewed  Vitals: Temperature 98.6 pulse 72 respiratory 22 saturations 99%  Ventilator Settings: Mode of ventilation assist control FiO2 50% PEEP 10 tidal volume 340  Physical Exam: . General:  calm and comfortable NAD . Eyes: normal lids, irises & conjunctiva . ENT: grossly normal tongue not enlarged . Neck: no masses . Cardiovascular: S1 S2 Normal no rubs no gallop . Respiratory: No rhonchi no rales are noted at this time . Abdomen: soft non-distended . Skin: no rash seen on limited exam . Musculoskeletal:  no rigidity . Psychiatric: unable to assess . Neurologic: no involuntary movements          Lab Data and radiological Data:  No labs to report   Assessment/Plan  Patient Active Problem List   Diagnosis Date Noted  . Acute on chronic respiratory failure with hypoxia ([place])   . MRSA (methicillin resistant staphylococcus aureus) pneumonia ([place])   . MRSA (methicillin resistant staphylococcus aureus) pneumonia ([place])   . ARDS (adult respiratory distress syndrome) ([place])   . Obstructive chronic bronchitis without exacerbation ([place])   . Chronic kidney disease, stage II (mild)       1. Acute on chronic respiratory failure with hypoxia we will continue with the ventilator support patient has failed numerous attempts at weaning. 2. MRSA pneumonia treated we will continue with supportive care 3. ARDS at baseline 4. Obstructive chronic bronchitis advanced  disease nebulizer as necessary 5. Chronic kidney disease will follow labs   I have personally evaluated the patient, evaluated the laboratory and imaging results and formulated the assessment and plan and placed orders as needed. The Patient requires high complexity decision making for assessment and support. I have discussed the patient on rounds with the Respiratory Staff   Allyne Gee, MD [facility] Pulmonary Critical Care Medicine

## 2019-03-27 ENCOUNTER — Other Ambulatory Visit (HOSPITAL_COMMUNITY): Payer: Medicare Other | Admitting: Internal Medicine

## 2019-03-27 DIAGNOSIS — J15212 Pneumonia due to Methicillin resistant Staphylococcus aureus: Secondary | ICD-10-CM

## 2019-03-27 DIAGNOSIS — N182 Chronic kidney disease, stage 2 (mild): Secondary | ICD-10-CM

## 2019-03-27 DIAGNOSIS — J9621 Acute and chronic respiratory failure with hypoxia: Secondary | ICD-10-CM | POA: Diagnosis not present

## 2019-03-27 DIAGNOSIS — J449 Chronic obstructive pulmonary disease, unspecified: Secondary | ICD-10-CM

## 2019-03-27 DIAGNOSIS — J80 Acute respiratory distress syndrome: Secondary | ICD-10-CM

## 2019-03-27 NOTE — Progress Notes (Signed)
[name]  PULMONARY SERVICE ROUNDS  Date of Service: 03/27/2019  Lindsey Collins  DOB: 10/24/1945  Referring physician: [name], MD  HPI: Lindsey Collins is a 73 y.o. female  being seen for Acute on Chronic Respiratory Failure.  Patient is on full support possible discharge today  Review of Systems: Unremarkable other than noted in HPI  Allergies:  Reviewed on the [facility]  Medications: Reviewed  Vitals: Temperature 97.9 pulse 68 respiratory 18 blood pressure 113/60 saturations 100%  Ventilator Settings: Mode of ventilation pressure assist control FiO2 50% inspiratory pressure 20 PEEP 10 tidal volume 408  Physical Exam: . General:  calm and comfortable NAD . Eyes: normal lids, irises & conjunctiva . ENT: grossly normal tongue not enlarged . Neck: no masses . Cardiovascular: S1 S2 Normal no rubs no gallop . Respiratory: Coarse breath sounds noted bilaterally . Abdomen: soft non-distended . Skin: no rash seen on limited exam . Musculoskeletal:  no rigidity . Psychiatric: unable to assess . Neurologic: no involuntary movements          Lab Data and radiological Data:  Sodium 148 potassium was 2.8 BUN 47 creatinine 1.1 White count 8.3 hemoglobin 8.5 hematocrit 29.3 platelet count 325   Assessment/Plan  Patient Active Problem List   Diagnosis Date Noted  . Acute on chronic respiratory failure with hypoxia ([place])   . MRSA (methicillin resistant staphylococcus aureus) pneumonia ([place])   . MRSA (methicillin resistant staphylococcus aureus) pneumonia ([place])   . ARDS (adult respiratory distress syndrome) ([place])   . Obstructive chronic bronchitis without exacerbation ([place])   . Chronic kidney disease, stage II (mild)       1. Acute on chronic respiratory failure with hypoxia we will continue with on full vent support possible discharge pending correction of electrolytes 2. MRSA pneumonia treated we will continue with supportive  care 3. ARDS at baseline we will continue to follow 4. Obstructive chronic bronchitis continue present management 5. Chronic kidney disease stage II labs as noted above   I have personally evaluated the patient, evaluated the laboratory and imaging results and formulated the assessment and plan and placed orders as needed. The Patient requires high complexity decision making for assessment and support. I have discussed the patient on rounds with the Respiratory Staff   Allyne Gee, MD [facility] Pulmonary Critical Care Medicine

## 2019-03-28 ENCOUNTER — Other Ambulatory Visit (HOSPITAL_COMMUNITY): Payer: Medicare Other | Admitting: Internal Medicine

## 2019-03-28 DIAGNOSIS — J9621 Acute and chronic respiratory failure with hypoxia: Secondary | ICD-10-CM | POA: Diagnosis not present

## 2019-03-28 DIAGNOSIS — N182 Chronic kidney disease, stage 2 (mild): Secondary | ICD-10-CM | POA: Diagnosis not present

## 2019-03-28 DIAGNOSIS — J80 Acute respiratory distress syndrome: Secondary | ICD-10-CM

## 2019-03-28 DIAGNOSIS — J15212 Pneumonia due to Methicillin resistant Staphylococcus aureus: Secondary | ICD-10-CM

## 2019-03-28 DIAGNOSIS — J449 Chronic obstructive pulmonary disease, unspecified: Secondary | ICD-10-CM

## 2019-03-28 NOTE — Progress Notes (Signed)
[name] NOTE  PULMONARY SERVICE ROUNDS  Date of Service: 03/28/2019  Lindsey Collins  DOB: Mar 28, 1946  Referring physician: [name], MD  HPI: Lindsey Collins is a 73 y.o. female  being seen for Acute on Chronic Respiratory Failure.  Comfortable right now without distress patient remains on pressure control mode is on 40% FiO2  Review of Systems: Unremarkable other than noted in HPI  Allergies:  Reviewed on the [facility]  Medications: Reviewed  Vitals: Temperature 97.7 pulse 70 respiratory rate 22 blood pressure 112/59 saturations 100%  Ventilator Settings: Mode ventilation pressure assist control FiO2 40% tidal volume is 328 PEEP 8  Physical Exam: . General:  calm and comfortable NAD . Eyes: normal lids, irises & conjunctiva . ENT: grossly normal tongue not enlarged . Neck: no masses . Cardiovascular: S1 S2 Normal no rubs no gallop . Respiratory: No rhonchi coarse breath sounds . Abdomen: soft non-distended . Skin: no rash seen on limited exam . Musculoskeletal:  no rigidity . Psychiatric: unable to assess . Neurologic: no involuntary movements          Lab Data and radiological Data:  Sodium 145 potassium 2.7 BUN 39 creatinine 0.9 [place] 9.1 hemoglobin 9.5 hematocrit 32.8 platelet count 374   Assessment/Plan  Patient Active Problem List   Diagnosis Date Noted  . Acute on chronic respiratory failure with hypoxia ([place])   . MRSA (methicillin resistant staphylococcus aureus) pneumonia ([place])   . MRSA (methicillin resistant staphylococcus aureus) pneumonia ([place])   . ARDS (adult respiratory distress syndrome) ([place])   . Obstructive chronic bronchitis without exacerbation ([place])   . Chronic kidney disease, stage II (mild)       1. Acute on chronic respiratory failure with hypoxia continue with full support on the ventilator patient is not able to wean off the ventilator plan is for discharge in the morning 2. MRSA pneumonia treated  resolved 3. ARDS clinically resolved 4. COPD advanced disease we will continue to monitor 5. Chronic kidney disease following labs   I have personally evaluated the patient, evaluated the laboratory and imaging results and formulated the assessment and plan and placed orders as needed. The Patient requires high complexity decision making for assessment and support. I have discussed the patient on rounds with the Respiratory Staff   Allyne Gee, MD [facility] Pulmonary Critical Care Medicine

## 2019-03-29 ENCOUNTER — Other Ambulatory Visit (HOSPITAL_COMMUNITY): Payer: Medicare Other | Admitting: Internal Medicine

## 2019-03-29 DIAGNOSIS — N182 Chronic kidney disease, stage 2 (mild): Secondary | ICD-10-CM

## 2019-03-29 DIAGNOSIS — J9621 Acute and chronic respiratory failure with hypoxia: Secondary | ICD-10-CM

## 2019-03-29 DIAGNOSIS — J80 Acute respiratory distress syndrome: Secondary | ICD-10-CM

## 2019-03-29 DIAGNOSIS — J449 Chronic obstructive pulmonary disease, unspecified: Secondary | ICD-10-CM

## 2019-03-29 DIAGNOSIS — J15212 Pneumonia due to Methicillin resistant Staphylococcus aureus: Secondary | ICD-10-CM

## 2019-03-29 NOTE — Progress Notes (Signed)
[place] NOTE  PULMONARY SERVICE ROUNDS  Date of Service: 03/29/2019  Lindsey Collins  DOB: [birth year]  Referring physician: [name], MD  HPI: Lindsey Collins is a 73 y.o. female  being seen for Acute on Chronic Respiratory Failure.  Patient currently is on full vent support awaiting discharge to skilled nursing facility  Review of Systems: Unremarkable other than noted in HPI  Allergies:  Reviewed on the [facility]  Medications: Reviewed  Vitals: Temperature 97.2 pulse 69 respiratory rate 22 blood pressure 105/61 saturations 100%  Ventilator Settings: Mode ventilation pressure assist control FiO2 30% inspiratory pressure 20 PEEP 5  Physical Exam: . General:  calm and comfortable NAD . Eyes: normal lids, irises & conjunctiva . ENT: grossly normal tongue not enlarged . Neck: no masses . Cardiovascular: S1 S2 Normal no rubs no gallop . Respiratory: Coarse rhonchi no rales . Abdomen: soft non-distended . Skin: no rash seen on limited exam . Musculoskeletal:  no rigidity . Psychiatric: unable to assess . Neurologic: no involuntary movements          Lab Data and radiological Data:  No labs to report today   Assessment/Plan  Patient Active Problem List   Diagnosis Date Noted  . Acute on chronic respiratory failure with hypoxia ([place])   . MRSA (methicillin resistant staphylococcus aureus) pneumonia ([place])   . MRSA (methicillin resistant staphylococcus aureus) pneumonia ([place])   . ARDS (adult respiratory distress syndrome) ([place])   . Obstructive chronic bronchitis without exacerbation ([place])   . Chronic kidney disease, stage II (mild)       1. Acute on chronic respiratory failure with hypoxia patient is at baseline not able to wean 2. MRSA pneumonia treated we will continue with supportive care 3. ARDS clinically resolved 4. COPD at baseline we will continue present management. 5. Chronic kidney disease stage II monitoring labs will  continue to follow   I have personally evaluated the patient, evaluated the laboratory and imaging results and formulated the assessment and plan and placed orders as needed. The Patient requires high complexity decision making for assessment and support. I have discussed the patient on rounds with the Respiratory Staff   Allyne Gee, MD [facility] Pulmonary Critical Care Medicine

## 2020-12-09 DIAGNOSIS — J15212 Pneumonia due to Methicillin resistant Staphylococcus aureus: Secondary | ICD-10-CM | POA: Diagnosis not present

## 2020-12-09 DIAGNOSIS — I480 Paroxysmal atrial fibrillation: Secondary | ICD-10-CM | POA: Diagnosis not present

## 2020-12-09 DIAGNOSIS — J9621 Acute and chronic respiratory failure with hypoxia: Secondary | ICD-10-CM | POA: Diagnosis not present

## 2020-12-09 DIAGNOSIS — I5032 Chronic diastolic (congestive) heart failure: Secondary | ICD-10-CM | POA: Diagnosis not present

## 2020-12-10 DIAGNOSIS — I480 Paroxysmal atrial fibrillation: Secondary | ICD-10-CM | POA: Diagnosis not present

## 2020-12-10 DIAGNOSIS — I5032 Chronic diastolic (congestive) heart failure: Secondary | ICD-10-CM | POA: Diagnosis not present

## 2020-12-10 DIAGNOSIS — J15212 Pneumonia due to Methicillin resistant Staphylococcus aureus: Secondary | ICD-10-CM | POA: Diagnosis not present

## 2020-12-10 DIAGNOSIS — J9621 Acute and chronic respiratory failure with hypoxia: Secondary | ICD-10-CM | POA: Diagnosis not present

## 2020-12-11 DIAGNOSIS — J15212 Pneumonia due to Methicillin resistant Staphylococcus aureus: Secondary | ICD-10-CM | POA: Diagnosis not present

## 2020-12-11 DIAGNOSIS — J9621 Acute and chronic respiratory failure with hypoxia: Secondary | ICD-10-CM | POA: Diagnosis not present

## 2020-12-11 DIAGNOSIS — I5032 Chronic diastolic (congestive) heart failure: Secondary | ICD-10-CM | POA: Diagnosis not present

## 2020-12-11 DIAGNOSIS — I480 Paroxysmal atrial fibrillation: Secondary | ICD-10-CM | POA: Diagnosis not present

## 2020-12-12 DIAGNOSIS — I5032 Chronic diastolic (congestive) heart failure: Secondary | ICD-10-CM | POA: Diagnosis not present

## 2020-12-12 DIAGNOSIS — J15212 Pneumonia due to Methicillin resistant Staphylococcus aureus: Secondary | ICD-10-CM | POA: Diagnosis not present

## 2020-12-12 DIAGNOSIS — I480 Paroxysmal atrial fibrillation: Secondary | ICD-10-CM | POA: Diagnosis not present

## 2020-12-12 DIAGNOSIS — J9621 Acute and chronic respiratory failure with hypoxia: Secondary | ICD-10-CM | POA: Diagnosis not present

## 2020-12-20 ENCOUNTER — Encounter (HOSPITAL_COMMUNITY): Payer: Self-pay | Admitting: Internal Medicine

## 2020-12-20 ENCOUNTER — Observation Stay (HOSPITAL_COMMUNITY)
Admission: AD | Admit: 2020-12-20 | Discharge: 2020-12-21 | Disposition: A | Discharge status: Home / Self Care | Payer: No Typology Code available for payment source | Source: Other Acute Inpatient Hospital | Attending: Internal Medicine | Admitting: Internal Medicine

## 2020-12-20 DIAGNOSIS — G20A1 Parkinson's disease without dyskinesia, without mention of fluctuations: Secondary | ICD-10-CM | POA: Diagnosis present

## 2020-12-20 DIAGNOSIS — Z930 Tracheostomy status: Secondary | ICD-10-CM

## 2020-12-20 DIAGNOSIS — Z20822 Contact with and (suspected) exposure to covid-19: Secondary | ICD-10-CM | POA: Insufficient documentation

## 2020-12-20 DIAGNOSIS — R778 Other specified abnormalities of plasma proteins: Principal | ICD-10-CM | POA: Insufficient documentation

## 2020-12-20 DIAGNOSIS — Z79899 Other long term (current) drug therapy: Secondary | ICD-10-CM | POA: Insufficient documentation

## 2020-12-20 DIAGNOSIS — R7989 Other specified abnormal findings of blood chemistry: Secondary | ICD-10-CM | POA: Diagnosis not present

## 2020-12-20 DIAGNOSIS — Z794 Long term (current) use of insulin: Secondary | ICD-10-CM | POA: Insufficient documentation

## 2020-12-20 DIAGNOSIS — I130 Hypertensive heart and chronic kidney disease with heart failure and stage 1 through stage 4 chronic kidney disease, or unspecified chronic kidney disease: Secondary | ICD-10-CM | POA: Insufficient documentation

## 2020-12-20 DIAGNOSIS — N182 Chronic kidney disease, stage 2 (mild): Secondary | ICD-10-CM | POA: Insufficient documentation

## 2020-12-20 DIAGNOSIS — E1165 Type 2 diabetes mellitus with hyperglycemia: Secondary | ICD-10-CM | POA: Diagnosis not present

## 2020-12-20 DIAGNOSIS — E1122 Type 2 diabetes mellitus with diabetic chronic kidney disease: Secondary | ICD-10-CM | POA: Diagnosis not present

## 2020-12-20 DIAGNOSIS — J9611 Chronic respiratory failure with hypoxia: Secondary | ICD-10-CM | POA: Diagnosis not present

## 2020-12-20 DIAGNOSIS — G20 Parkinson's disease: Secondary | ICD-10-CM | POA: Insufficient documentation

## 2020-12-20 DIAGNOSIS — I5032 Chronic diastolic (congestive) heart failure: Secondary | ICD-10-CM | POA: Diagnosis not present

## 2020-12-20 DIAGNOSIS — IMO0002 Reserved for concepts with insufficient information to code with codable children: Secondary | ICD-10-CM | POA: Diagnosis present

## 2020-12-20 DIAGNOSIS — R0902 Hypoxemia: Secondary | ICD-10-CM

## 2020-12-20 DIAGNOSIS — D649 Anemia, unspecified: Secondary | ICD-10-CM | POA: Diagnosis not present

## 2020-12-20 DIAGNOSIS — Z7901 Long term (current) use of anticoagulants: Secondary | ICD-10-CM | POA: Diagnosis not present

## 2020-12-20 LAB — CBC WITH DIFFERENTIAL/PLATELET
Abs Immature Granulocytes: 0.03 10*3/uL (ref 0.00–0.07)
Basophils Absolute: 0.1 10*3/uL (ref 0.0–0.1)
Basophils Relative: 1 %
Eosinophils Absolute: 0.2 10*3/uL (ref 0.0–0.5)
Eosinophils Relative: 3 %
HCT: 34.7 % — ABNORMAL LOW (ref 36.0–46.0)
Hemoglobin: 11 g/dL — ABNORMAL LOW (ref 12.0–15.0)
Immature Granulocytes: 1 %
Lymphocytes Relative: 43 %
Lymphs Abs: 3 10*3/uL (ref 0.7–4.0)
MCH: 30.6 pg (ref 26.0–34.0)
MCHC: 31.7 g/dL (ref 30.0–36.0)
MCV: 96.7 fL (ref 80.0–100.0)
Monocytes Absolute: 0.5 10*3/uL (ref 0.1–1.0)
Monocytes Relative: 8 %
Neutro Abs: 2.9 10*3/uL (ref 1.7–7.7)
Neutrophils Relative %: 44 %
Platelets: 336 10*3/uL (ref 150–400)
RBC: 3.59 MIL/uL — ABNORMAL LOW (ref 3.87–5.11)
RDW: 14.8 % (ref 11.5–15.5)
WBC: 6.7 10*3/uL (ref 4.0–10.5)
nRBC: 0 % (ref 0.0–0.2)

## 2020-12-20 LAB — COMPREHENSIVE METABOLIC PANEL
ALT: 7 U/L (ref 0–44)
AST: 13 U/L — ABNORMAL LOW (ref 15–41)
Albumin: 3.1 g/dL — ABNORMAL LOW (ref 3.5–5.0)
Alkaline Phosphatase: 51 U/L (ref 38–126)
Anion gap: 11 (ref 5–15)
BUN: 14 mg/dL (ref 8–23)
CO2: 31 mmol/L (ref 22–32)
Calcium: 8.6 mg/dL — ABNORMAL LOW (ref 8.9–10.3)
Chloride: 96 mmol/L — ABNORMAL LOW (ref 98–111)
Creatinine, Ser: 0.87 mg/dL (ref 0.44–1.00)
GFR, Estimated: >60 mL/min (ref >60)
Glucose, Bld: 177 mg/dL — ABNORMAL HIGH (ref 70–99)
Potassium: 4.6 mmol/L (ref 3.5–5.1)
Sodium: 138 mmol/L (ref 135–145)
Total Bilirubin: 0.5 mg/dL (ref 0.3–1.2)
Total Protein: 6.7 g/dL (ref 6.5–8.1)

## 2020-12-20 LAB — GLUCOSE, CAPILLARY
Glucose-Capillary: 254 mg/dL — ABNORMAL HIGH (ref 70–99)
Glucose-Capillary: 294 mg/dL — ABNORMAL HIGH (ref 70–99)

## 2020-12-20 LAB — LACTIC ACID, PLASMA
Lactic Acid, Venous: 2.3 mmol/L (ref 0.5–1.9)
Lactic Acid, Venous: 2.8 mmol/L (ref 0.5–1.9)

## 2020-12-20 LAB — BRAIN NATRIURETIC PEPTIDE: B Natriuretic Peptide: 53.6 pg/mL (ref 0.0–100.0)

## 2020-12-20 LAB — HEMOGLOBIN A1C
Hgb A1c MFr Bld: 7.2 % — ABNORMAL HIGH (ref 4.8–5.6)
Mean Plasma Glucose: 159.94 mg/dL

## 2020-12-20 LAB — TROPONIN I (HIGH SENSITIVITY): Troponin I (High Sensitivity): 5 ng/L (ref <18)

## 2020-12-20 MED ORDER — ACETAMINOPHEN 650 MG RE SUPP: 650.0000 mg | Freq: Four times a day (QID) | RECTAL | Status: DC | PRN

## 2020-12-20 MED ORDER — ALLOPURINOL 100 MG PO TABS
200.0000 mg | ORAL_TABLET | Freq: Every day | ORAL | Status: DC
  Administered 2020-12-20 – 2020-12-21 (×2): 200 mg via ORAL
  Filled 2020-12-20 (×2): qty 2

## 2020-12-20 MED ORDER — DIVALPROEX SODIUM 125 MG PO CSDR
500.0000 mg | DELAYED_RELEASE_CAPSULE | Freq: Two times a day (BID) | ORAL | Status: DC
  Administered 2020-12-20 – 2020-12-21 (×2): 500 mg via ORAL
  Filled 2020-12-20 (×3): qty 4

## 2020-12-20 MED ORDER — INSULIN ASPART 100 UNIT/ML ~~LOC~~ SOLN
0.0000 [IU] | Freq: Three times a day (TID) | SUBCUTANEOUS | Status: DC
Start: 2020-12-20 — End: 2020-12-21
  Administered 2020-12-20 – 2020-12-21 (×3): 8 [IU] via SUBCUTANEOUS

## 2020-12-20 MED ORDER — AMIODARONE HCL 200 MG PO TABS
200.0000 mg | ORAL_TABLET | Freq: Every day | ORAL | Status: DC
  Administered 2020-12-21: 200 mg via ORAL
  Filled 2020-12-20: qty 1

## 2020-12-20 MED ORDER — PRAMIPEXOLE DIHYDROCHLORIDE 0.125 MG PO TABS
0.1250 mg | ORAL_TABLET | Freq: Three times a day (TID) | ORAL | Status: DC
  Administered 2020-12-20 – 2020-12-21 (×3): 0.125 mg via ORAL
  Filled 2020-12-20 (×5): qty 1

## 2020-12-20 MED ORDER — PROPYLTHIOURACIL 50 MG PO TABS
50.0000 mg | ORAL_TABLET | Freq: Two times a day (BID) | ORAL | Status: DC
  Administered 2020-12-20 – 2020-12-21 (×2): 50 mg via ORAL
  Filled 2020-12-20 (×3): qty 1

## 2020-12-20 MED ORDER — CARBIDOPA-LEVODOPA 25-100 MG PO TABS
1.0000 | ORAL_TABLET | Freq: Four times a day (QID) | ORAL | Status: DC
  Administered 2020-12-20 – 2020-12-21 (×4): 1 via ORAL
  Filled 2020-12-20 (×7): qty 1

## 2020-12-20 MED ORDER — POLYETHYLENE GLYCOL 3350 17 G PO PACK
17.0000 g | PACK | Freq: Every day | ORAL | Status: DC
  Administered 2020-12-21: 17 g via ORAL
  Filled 2020-12-20: qty 1

## 2020-12-20 MED ORDER — FLUOXETINE HCL 10 MG PO CAPS
10.0000 mg | ORAL_CAPSULE | Freq: Every day | ORAL | Status: DC
  Administered 2020-12-21: 10 mg via ORAL
  Filled 2020-12-20 (×2): qty 1

## 2020-12-20 MED ORDER — ATORVASTATIN CALCIUM 10 MG PO TABS
20.0000 mg | ORAL_TABLET | Freq: Every day | ORAL | Status: DC
  Administered 2020-12-20: 20 mg via ORAL
  Filled 2020-12-20: qty 2

## 2020-12-20 MED ORDER — SENNOSIDES-DOCUSATE SODIUM 8.6-50 MG PO TABS
2.0000 | ORAL_TABLET | Freq: Every day | ORAL | Status: DC
  Administered 2020-12-20: 2 via ORAL
  Filled 2020-12-20: qty 2

## 2020-12-20 MED ORDER — MEMANTINE HCL 5 MG PO TABS
5.0000 mg | ORAL_TABLET | Freq: Two times a day (BID) | ORAL | Status: DC
  Administered 2020-12-20 – 2020-12-21 (×2): 5 mg via ORAL
  Filled 2020-12-20 (×3): qty 1

## 2020-12-20 MED ORDER — APIXABAN 5 MG PO TABS
5.0000 mg | ORAL_TABLET | Freq: Two times a day (BID) | ORAL | Status: DC
  Administered 2020-12-20 – 2020-12-21 (×2): 5 mg via ORAL
  Filled 2020-12-20 (×2): qty 1

## 2020-12-20 MED ORDER — OLANZAPINE 10 MG PO TABS
10.0000 mg | ORAL_TABLET | Freq: Every day | ORAL | Status: DC
  Administered 2020-12-20: 10 mg via ORAL
  Filled 2020-12-20 (×2): qty 1

## 2020-12-20 MED ORDER — SODIUM CHLORIDE 0.9% FLUSH
3.0000 mL | Freq: Two times a day (BID) | INTRAVENOUS | Status: DC
  Administered 2020-12-20 – 2020-12-21 (×3): 3 mL via INTRAVENOUS

## 2020-12-20 MED ORDER — ALBUTEROL SULFATE (2.5 MG/3ML) 0.083% IN NEBU: 2.5000 mg | INHALATION_SOLUTION | Freq: Four times a day (QID) | RESPIRATORY_TRACT | Status: DC | PRN

## 2020-12-20 MED ORDER — PANTOPRAZOLE SODIUM 40 MG PO TBEC
40.0000 mg | DELAYED_RELEASE_TABLET | Freq: Every day | ORAL | Status: DC
  Administered 2020-12-20 – 2020-12-21 (×2): 40 mg via ORAL
  Filled 2020-12-20 (×2): qty 1

## 2020-12-20 MED ORDER — ACETAMINOPHEN 325 MG PO TABS: 650.0000 mg | ORAL_TABLET | Freq: Four times a day (QID) | ORAL | Status: DC | PRN

## 2020-12-20 MED ORDER — SODIUM CHLORIDE 0.9 % IV BOLUS
250.0000 mL | Freq: Once | INTRAVENOUS | Status: AC
  Administered 2020-12-20: 250 mL via INTRAVENOUS

## 2020-12-20 MED ORDER — SODIUM CHLORIDE 0.9 % IV SOLN: INTRAVENOUS | Status: DC

## 2020-12-20 MED ORDER — MAGNESIUM OXIDE 400 (241.3 MG) MG PO TABS
400.0000 mg | ORAL_TABLET | Freq: Two times a day (BID) | ORAL | Status: DC
  Administered 2020-12-20 – 2020-12-21 (×2): 400 mg via ORAL
  Filled 2020-12-20 (×4): qty 1

## 2020-12-20 MED ORDER — GLUCERNA SHAKE PO LIQD
237.0000 mL | Freq: Two times a day (BID) | ORAL | Status: DC
  Administered 2020-12-20 – 2020-12-21 (×3): 237 mL via ORAL

## 2020-12-20 MED ORDER — METOPROLOL TARTRATE 25 MG PO TABS
25.0000 mg | ORAL_TABLET | Freq: Two times a day (BID) | ORAL | Status: DC
  Administered 2020-12-20 – 2020-12-21 (×2): 25 mg via ORAL
  Filled 2020-12-20 (×2): qty 1

## 2020-12-20 MED ORDER — FUROSEMIDE 40 MG PO TABS
60.0000 mg | ORAL_TABLET | Freq: Two times a day (BID) | ORAL | Status: DC
  Administered 2020-12-21: 60 mg via ORAL
  Filled 2020-12-20: qty 1

## 2020-12-20 MED ORDER — MELATONIN 3 MG PO TABS
6.0000 mg | ORAL_TABLET | Freq: Every day | ORAL | Status: DC
  Administered 2020-12-20: 6 mg via ORAL
  Filled 2020-12-20: qty 2

## 2020-12-20 MED ORDER — INSULIN GLARGINE 100 UNIT/ML ~~LOC~~ SOLN
34.0000 [IU] | Freq: Every day | SUBCUTANEOUS | Status: DC
  Administered 2020-12-20: 34 [IU] via SUBCUTANEOUS
  Filled 2020-12-20 (×2): qty 0.34

## 2020-12-20 NOTE — Consult Note (Addendum)
Cardiology Consultation:   Patient ID: Lindsey Collins MRN: [id number]; DOB: [birth year]  Admit date: 12/20/2020 Date of Consult: 12/20/2020  PCP:  [name], MD   [place]  Cardiologist:  No primary care provider on file. New Advanced Practice Provider:  No care team member to display Electrophysiologist:  None   Patient Profile:   Lindsey Collins is a 75 y.o. 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  History of Present Illness:   Lindsey Collins was admitted to [facility] on 03/25 after a 3 day admission to [facility] for [place], had +blood cx of actinomyces odontolytics and strep parasanguinis w/ ABX to complete 03/28. Sputum Cx + serratia marcescens as well.  At [facility], she was on a modified vent wean with CPAP.  She was then started on T-bar trials.  They tried to Her on 12/17/2020 and she did not tolerate this.  She had a rapid response episode on 12/17/2020 where she was found to have bradycardia and desaturations.  It could have been a mucous plug.  She was on a Passy-Muir valve in the T-bar.  Her ECG showed sinus tachycardia and she was started on low-dose metoprolol.  At [place], she was continued on her Lasix of 60 mg daily and did not appear to be volume overloaded.  Her heart rate was regular and an ECG showed sinus rhythm, she was continued on metoprolol, amiodarone and Eliquis.  After the episode of altered mental status, a troponin checked and was elevated, tx to [facility] and cards asked to see.   Lindsey Collins has not had any chest pain.  She denies any shortness of breath.  Her respiratory status has been stable on her Lasix dose of 60 mg twice daily.  She  does not remember the events of the fifth, she is not sure what happened or how it started.  Has not had any palpitations.  She is not aware of a diagnosis of atrial fibrillation or any other arrhythmia.  However, she is aware that she is on a blood thinner.   She is not having pain anywhere, and feels that she is doing very well in general right now.   Past Medical History:  Diagnosis Date  . Acute on chronic respiratory failure with hypoxia ([place])   . ARDS (adult respiratory distress syndrome) ([place])   . Chronic kidney disease, stage II (mild)   . MRSA (methicillin resistant staphylococcus aureus) pneumonia ([place])   . Obstructive chronic bronchitis without exacerbation [facility])     Past Surgical History:  Procedure Laterality Date  . TRACHEOSTOMY       Home Medications:  Prior to Admission medications   Medication Sig Start Date End Date Taking? Authorizing Provider  allopurinol (ZYLOPRIM) 100 MG tablet Take 200 mg by mouth daily.   Yes [provider]  amiodarone (PACERONE) 200 MG tablet Take 200 mg by mouth daily. 10/25/20  Yes [provider]  atorvastatin (LIPITOR) 20 MG tablet Take 20 mg by mouth at bedtime. 11/26/20  Yes [provider]  carbidopa-levodopa (SINEMET IR) 25-100 MG tablet Take 1 tablet by mouth every 6 (six) hours. 10/28/20  Yes [provider]  chlorhexidine (PERIDEX) 0.12 %  solution Use as directed 15 mLs in the mouth or throat 2 (two) times daily.   Yes [provider]  cholecalciferol (VITAMIN D3) 25 MCG (1000 UNIT) tablet Take 1,000 Units by mouth daily.   Yes [provider]  divalproex (DEPAKOTE SPRINKLE) 125 MG capsule Take 500 mg by mouth 2 (two) times daily. 11/24/20  Yes [provider]  ELIQUIS 5 MG TABS tablet Take 1 tablet by mouth 2 (two) times daily. 12/02/20  Yes [provider]  FLUoxetine (PROZAC) 10 MG tablet Take 10 mg by mouth daily. 11/08/20  Yes [provider]   furosemide (LASIX) 20 MG tablet Take 60 mg by mouth every 12 (twelve) hours. 12/02/20  Yes [provider]  insulin glargine (LANTUS) 100 UNIT/ML injection Inject 34 Units into the skin at bedtime.   Yes [provider]  insulin lispro (HUMALOG) 100 UNIT/ML injection Inject into the skin 4 (four) times daily -  before meals and at bedtime.   Yes [provider]  ipratropium-albuterol (DUONEB) 0.5-2.5 (3) MG/3ML SOLN Take 3 mLs by nebulization every 4 (four) hours as needed (shortness of breath).   Yes [provider]  magnesium oxide (MAG-OX) 400 MG tablet Take 400 mg by mouth 2 (two) times daily.   Yes [provider]  melatonin 3 MG TABS tablet Take 6 mg by mouth at bedtime.   Yes [provider]  memantine (NAMENDA) 5 MG tablet Take 5 mg by mouth 2 (two) times daily. 11/26/20  Yes [provider]  metoprolol tartrate (LOPRESSOR) 25 MG tablet Take 25 mg by mouth 2 (two) times daily.   Yes [provider]  miconazole (MICOTIN) 2 % powder Apply 1 application topically every 12 (twelve) hours.   Yes [provider]  OLANZapine (ZYPREXA) 10 MG tablet Take 10 mg by mouth at bedtime. 11/16/20  Yes [provider]  ondansetron (ZOFRAN-ODT) 4 MG disintegrating tablet Take 4 mg by mouth every 8 (eight) hours as needed for nausea/vomiting. 08/16/20  Yes [provider]  pantoprazole (PROTONIX) 40 MG tablet Take 40 mg by mouth daily.   Yes [provider]  polyethylene glycol (MIRALAX / GLYCOLAX) 17 g packet Take 17 g by mouth daily.   Yes [provider]  potassium chloride (KLOR-CON) 20 MEQ packet Take 40 mEq by mouth daily.   Yes [provider]  pramipexole (MIRAPEX) 0.125 MG tablet Take 0.125 mg by mouth every 8 (eight) hours. 11/05/20  Yes [provider]  propylthiouracil (PTU) 50 MG tablet Take 50 mg by mouth every 12 (twelve) hours. 10/19/20  Yes [provider]   senna-docusate (SENOKOT-S) 8.6-50 MG tablet Take 2 tablets by mouth at bedtime.   Yes [provider]    Inpatient Medications: Scheduled Meds: . feeding supplement (GLUCERNA SHAKE)  237 mL Oral BID BM  . insulin aspart  0-15 Units Subcutaneous TID WC  . sodium chloride flush  3 mL Intravenous Q12H   Continuous Infusions: . sodium chloride     PRN Meds: acetaminophen **OR** acetaminophen, albuterol  Allergies:   No Known Allergies  Social History:   Social History   Socioeconomic History  . Marital status: Divorced    Spouse name: Not on file  . Number of children: Not on file  . Years of education: Not on file  . Highest education level: Not on file  Occupational History  . Not on file  Tobacco Use  . Smoking status: Not on file  .  Smokeless tobacco: Not on file  Substance and Sexual Activity  . Alcohol use: Not on file  . Drug use: Not on file  . Sexual activity: Not on file  Other Topics Concern  . Not on file  Social History Narrative  . Not on file   Social Determinants of Health   Financial Resource Strain: Not on file  Food Insecurity: Not on file  Transportation Needs: Not on file  Physical Activity: Not on file  Stress: Not on file  Social Connections: Not on file  Intimate Partner Violence: Not on file    Family History:   Family History  Problem Relation Age of Onset  . CAD Mother        hx CABG    Family Status  Relation Name Status  . Daughter  Alive  . Son  Alive  . Mother  Deceased  . Father  Deceased    ROS:  Please see the history of present illness.  All other ROS reviewed and negative.     Physical Exam/Data:   Vitals:   12/20/20 1220 12/20/20 1449  BP: 126/63   Pulse: 62 63  Resp: 16   Temp: (!) 97.4 F (36.3 C)   TempSrc: Axillary   SpO2: 98% 98%   No intake or output data in the 24 hours ending 12/20/20 1519 No flowsheet data found.   There is no height or weight on file to calculate BMI.  General:   Well nourished, well developed, in no acute distress HEENT: normal Lymph: no adenopathy Neck: no JVD seen, difficult to assess secondary to trach equipment and body habitus Endocrine:  No thryomegaly Vascular: No carotid bruits; 4/4 extremity pulses 2+   Cardiac:  normal S1, S2; RRR; no murmur  Lungs:  clear to auscultation bilaterally, no wheezing, rhonchi or rales  Abd: soft, nontender, no hepatomegaly  Ext: no edema Musculoskeletal:  No deformities, BUE and BLE strength but equal Skin: warm and dry  Neuro:  CNs 2-12 intact, no focal abnormalities noted Psych:  Normal affect   EKG:  The EKG was personally reviewed and demonstrates: Pending Telemetry:  Telemetry was personally reviewed and demonstrates: Sinus rhythm, no significant ectopy  Relevant CV Studies:  ECHO: 12/04/2020 SUMMARY The left ventricular size is normal. There is mild concentric left ventricular hypertrophy. Global LV systolic function is preserved Left ventricular filling pattern is prolonged relaxation. Unable to fully assess LV regional wall motion due to limited visualization. The right ventricle is not well visualized. There is mild mitral regurgitation. The IVC is normal in size with an inspiratory collapse of greater then 50%, suggesting normal right atrial pressure. There is no pericardial effusion. There is no comparison study available. FINDINGS: LEFT VENTRICLE The left ventricular size is normal. There is mild concentric left ventricular hypertrophy. Global LV systolic function is preserved. Left ventricular filling pattern is prolonged relaxation. Unable to fully assess LV regional wall motion.  - RIGHT VENTRICLE The right ventricle is not well visualized.  LEFT ATRIUM The left atrium is not well visualized.  RIGHT ATRIUM Right atrium not well visualized. - AORTIC VALVE Structurally normal aortic valve. The aortic valve is trileaflet. There is no aortic stenosis. There is no aortic  regurgitation. - MITRAL VALVE There is trivial mitral valve thickening. There is mild mitral annular calcification. There is mild mitral regurgitation. - TRICUSPID VALVE The tricuspid valve is normal in structure and function. There is trace tricuspid regurgitation. Estimated right ventricular systolic pressure is 21 mmHg. No  pulmonary hypertension. - PULMONIC VALVE Structurally normal pulmonic valve. Trace pulmonic valvular regurgitation. - ARTERIES The ascending aorta is normal size. - VENOUS Pulmonary venous flow pattern is normal. The IVC is normal in size with an inspiratory collapse of greater then 50%, suggesting normal right atrial pressure. - EFFUSION There is no pericardial effusion. Pericardial fat pad is noted. MMode/2D Measurements & Calculations IVSd: 1.1 cm LA diam: 3.0 cmasc Aorta Diam: 3.1 cm LVOT diam: 2.0 cm LVIDd: 3.6 cm RVDd: 2.5 cm LVPWd: 1.1 cm LVIDs: 2.7 cm  Doppler Measurements & Calculations MV E max vel:  MV dec time:   SV(LVOT):   LV V1 VTI: 10.5 cm 41.4 cm/sec   0.06 sec     33.1 ml MV A max vel:           Ao V2 max: 69.0 cm/sec            91.7 cm/sec MV E/A: 0.60           Ao max PG:                 3.4 mmHg                 Ao V2 mean:                 69.1 cm/sec                 Ao mean PG:                 2.1 mmHg                 Ao V2 VTI:                 19.5 cm                 AVA (VTI):                 1.7 cm2     _______________________________________________________________________ PA max PG:    TR max vel:   RAP systole:  AS Dimensionless 2.3 mmHg     211.6 cm/sec   3.0 mmHg    Index (VTI): 0.54         TR max PG:         17.9 mmHg         RVSP(TR):         20.9 mmHg      _______________________________________________________________________ AVAi(VTI)    SV index(LVOT): cm^2/m^2:    16.9 ml/m2 0.86 cm2   Laboratory Data: Most labs are still pending  High Sensitivity Troponin:  No results for input(s): TROPONINIHS in the last 720 hours.   ChemistryNo results for input(s): NA, K, CL, CO2, GLUCOSE, BUN, CREATININE, CALCIUM, GFRNONAA, GFRAA, ANIONGAP in the last 168 hours.  No results for input(s): PROT, ALBUMIN, AST, ALT, ALKPHOS, BILITOT in the last 168 hours. Hematology Recent Labs  Lab 12/20/20 1317  WBC 6.7  RBC 3.59*  HGB 11.0*  HCT 34.7*  MCV 96.7  MCH 30.6  MCHC 31.7  RDW 14.8  PLT 336   BNPNo results for input(s): BNP, PROBNP in the last 168 hours.  DDimer No results for input(s): DDIMER in the last 168 hours.   Radiology/Studies:  No results found.   Assessment and Plan:   1. Elevated troponin: -Mild elevation in troponin (still within normal limits) after an event of hypoxia, possibly due to mucous plugging. -No chest pain or ischemic symptoms -  No history of coronary artery disease -EF normal on recent echo with grade 1 diastolic dysfunction -Follow-up on current troponin levels. -If repeat troponin remains negative, no further cardiac work-up indicated.  Risk Assessment/Risk Scores:            [facility] will sign off.   Medication Recommendations: Continue current therapy Other recommendations (labs, testing, etc): None Follow up as an outpatient: As indicated  For questions or updates, please contact [facility] Please consult [email or web address] for contact info under    Signed, Rosaria Ferries, PA-C  12/20/2020 3:19 PM   I have examined the patient and reviewed assessment and plan and discussed with patient.  Agree with above as stated.  No chest pain. Normal LVEF by echo.  Overall, she feels well.  No further cardiac w/u needed.   Larae Grooms

## 2020-12-20 NOTE — Progress Notes (Signed)
Notified by lab of critical lactic acid level of 2.3. Rondell A. Tamala Julian MD notified.

## 2020-12-20 NOTE — H&P (Addendum)
History and Physical    Lindsey Collins KGU:[id number] DOB: March 15, 1946 DOA: 12/20/2020   PCP: [name], MD  Patient coming from: [facility]  Chief Complaint: Abnormal EKG  I have personally briefly reviewed patient's old medical records in [place]   HPI: Lindsey Collins is a 75 y.o. female with medical history significant of chronic respiratory failure trach dependent, diastolic CHF, T2 DM, HTN, Parkinson's disease, MRSA pneumonia, and bipolar disorder who reportedly was found to be acutely altered 3 days ago with respiratory distress.  Apparently rapid response was called for acute desaturations and she required bag masking temporarily and heart rates were noted to be in the 30s-40s.  Suspected possibly secondary  to mucous plugging.  Thereafter CT imaging of the head and chest showed no acute abnormalities.  Patient was noted to be altered for approximately 2 hours prior to returning back to her baseline.  She was able to be placed back on Passy-Muir valve in the T-bar.  Labs werel reported to show a troponin < 0.5, 26, and <0.5.  It was advised that the EKG showed ST wave depressions concerning for inferior ischemia.  Case had been discussed with cardiology who recommended a medical admission.  Patient had been accepted to a progressive bed.  Denies any reports of chest pain, palpitations, shortness of breath, or any other complaints at this time.  Patient does not recall what happened when rapid response was called.  Patient just recently hospitalized from 3/22-3/25 at [facility] for serrata pneumonia along with positive blood cultures for actinomyces odontolytics and strep parasanguinis.  She had been discharged to [facility] facility for vent weaning.  She was treated with a 7-day course of cefepime that ended on 3/28.  During her hospitalization it appears she had echocardiogram performed on 3/23 that showed preserved EF.   ED Course: As seen  above  Review of Systems  Unable to perform ROS: Dementia  Constitutional: Negative for fever.  HENT: Negative for congestion and nosebleeds.   Eyes: Negative for photophobia and pain.  Respiratory: Negative for sputum production.   Cardiovascular: Negative for chest pain and leg swelling.  Gastrointestinal: Negative for abdominal pain, nausea and vomiting.  Genitourinary: Negative for dysuria and hematuria.  Musculoskeletal: Negative for falls.  Neurological: Positive for weakness.  Psychiatric/Behavioral: Negative for substance abuse.    Past Medical History:  Diagnosis Date  . Acute on chronic respiratory failure with hypoxia ([place])   . ARDS (adult respiratory distress syndrome) ([place])   . Chronic kidney disease, stage II (mild)   . MRSA (methicillin resistant staphylococcus aureus) pneumonia ([place])   . Obstructive chronic bronchitis without exacerbation [facility])     Past Surgical History:  Procedure Laterality Date  . TRACHEOSTOMY       has no history on file for tobacco use, alcohol use, and drug use.  No Known Allergies  Family History  Problem Relation Age of Onset  . CAD Mother        hx CABG    Prior to Admission medications   Not on File    Physical Exam:  Constitutional: Elderly female currently appears to be in no acute distress Vitals:   12/20/20 1220 12/20/20 1449 12/20/20 1626  BP: 126/63  (!) 97/55  Pulse: 62 63 73  Resp: 16  16  Temp: (!) 97.4 F (36.3 C)  98.2 F (36.8 C)  TempSrc: Axillary  Oral  SpO2: 98% 98% 98%   Eyes: PERRL, lids and  conjunctivae normal ENMT: Mucous membranes are moist. Posterior pharynx clear of any exudate or lesions.Normal dentition.  Neck: Tracheostomy in place. Respiratory: No significant wheezes rhonchi appreciated. Cardiovascular: Regular rate and rhythm, no murmurs / rubs / gallops. No extremity edema. 2+ pedal pulses. No carotid bruits.  Abdomen: no tenderness, no masses palpated. No hepatosplenomegaly. Bowel  sounds positive.  Musculoskeletal: no clubbing / cyanosis. No joint deformity upper and lower extremities. Good ROM, no contractures. Normal muscle tone.  Skin: no rashes, lesions, ulcers. No induration Neurologic: CN 2-12 grossly intact. Sensation intact, DTR normal. Strength 5/5 in all 4.  Psychiatric: Normal judgment and insight. Alert and oriented x 3. Normal mood.     Labs on Admission: I have personally reviewed following labs and imaging studies  CBC: No results for input(s): WBC, NEUTROABS, HGB, HCT, MCV, PLT in the last 168 hours. Basic Metabolic Panel: No results for input(s): NA, K, CL, CO2, GLUCOSE, BUN, CREATININE, CALCIUM, MG, PHOS in the last 168 hours. GFR: CrCl cannot be calculated (No successful lab value found.). Liver Function Tests: No results for input(s): AST, ALT, ALKPHOS, BILITOT, PROT, ALBUMIN in the last 168 hours. No results for input(s): LIPASE, AMYLASE in the last 168 hours. No results for input(s): AMMONIA in the last 168 hours. Coagulation Profile: No results for input(s): INR, PROTIME in the last 168 hours. Cardiac Enzymes: No results for input(s): CKTOTAL, CKMB, CKMBINDEX, TROPONINI in the last 168 hours. BNP (last 3 results) No results for input(s): PROBNP in the last 8760 hours. HbA1C: No results for input(s): HGBA1C in the last 72 hours. CBG: No results for input(s): GLUCAP in the last 168 hours. Lipid Profile: No results for input(s): CHOL, HDL, LDLCALC, TRIG, CHOLHDL, LDLDIRECT in the last 72 hours. Thyroid Function Tests: No results for input(s): TSH, T4TOTAL, FREET4, T3FREE, THYROIDAB in the last 72 hours. Anemia Panel: No results for input(s): VITAMINB12, FOLATE, FERRITIN, TIBC, IRON, RETICCTPCT in the last 72 hours. Urine analysis: No results found for: COLORURINE, APPEARANCEUR, LABSPEC, PHURINE, GLUCOSEU, HGBUR, BILIRUBINUR, KETONESUR, PROTEINUR, UROBILINOGEN, NITRITE, LEUKOCYTESUR Sepsis Labs: No results found for this or any  previous visit (from the past 240 hour(s)).   Radiological Exams on Admission: No results found.  EKG: Independently reviewed.  Normal sinus rhythm at 68 bpm.  Assessment/Plan   Elevated troponin: Resolved.  Patient was reported to have elevated troponin at the facility with abnormal EKG.  EKG appears similar to previous.  High-sensitivity troponin here at the hospital is negative.  Patient without any complaints of chest pain and echocardiogram performed from 3/23 noted normal EF.  Cardiology have been formally consulted but recommended no further work-up. -Admit to a progressive bed due to trach -Follow-up telemetry  Chronic respiratory failure with hypoxia status post trach: Patient currently on trach collar and maintaining O2 saturations without any issues.  Chest x-ray from outside facility noted to be clear.  Question the possibility of mucous plugging as a cause for her acute desaturation on 4/5. -Continuous pulse oximetry -Respiratory therapy consulted -Pulmonary toiletry as needed  Lactic acidosis: Acute.  On admission lactic acid elevated at 2.3.  Patient had just recently been admitted to the hospital for sepsis with concern for pneumonia, bacteremia, and possible UTI.  Question of symptoms secondary to patient being dehydrated. -Continue to trend lactic acid -Check blood and urine culture -IV fluids 75 mL/h  History of arrhythmia: There was concern for undocumented arrhythmia prior to patient being admitted to the hospital at the end of March.  Home medications  include amiodarone 200 mg daily, Eliquis 5 mg twice daily, and metoprolol 25 mg twice daily. -Continue current regimen -Follow-up telemetry  Diastolic CHF: Echocardiogram revealed normal EF with some diastolic dysfunction.  Home medication regimen includes Lasix 60 mg p.o. twice daily. -Initially held to be resumed in a.m. as patient appeared to be possibly dry -Check BNP(53.6)  Diabetes mellitus type 2,  uncontrolled: Last hemoglobin A1c was noted to be 7.9 about 4 months ago.  Blood sugars were initially elevated into the 200s -Hypoglycemic protocol -Check hemoglobin A1c (7.2)  -Continue Lantus 34 units nightly per current regimen  Normocytic anemia: Stable.  Hemoglobin 11 g/dL which appears near patient's baseline. -Continue to monitor  Essential hypertension -Continue Lopressor 25 mg twice daily  Parkinson's disease -Continue Sinemet and Namenda   Bipolar disorder depression/anxiety -Continue olanzapine and fluoxetine  Hyperlipidemia -Continue atorvastatin  Gout -Continue allopurinol     DVT prophylaxis: Eliquis Code Status: Full Family Communication: Daughter not available by phone Disposition Plan: Likely discharge back to [facility] once medically stable Consults called: Cardiology Admission status: Observation Norval Morton MD [facility]   If 7PM-7AM, please contact night-coverage   12/20/2020, 12:12 PM

## 2020-12-21 ENCOUNTER — Observation Stay (HOSPITAL_COMMUNITY): Payer: No Typology Code available for payment source

## 2020-12-21 DIAGNOSIS — R778 Other specified abnormalities of plasma proteins: Secondary | ICD-10-CM | POA: Diagnosis not present

## 2020-12-21 LAB — CBC
HCT: 31.3 % — ABNORMAL LOW (ref 36.0–46.0)
Hemoglobin: 9.9 g/dL — ABNORMAL LOW (ref 12.0–15.0)
MCH: 30.7 pg (ref 26.0–34.0)
MCHC: 31.6 g/dL (ref 30.0–36.0)
MCV: 96.9 fL (ref 80.0–100.0)
Platelets: 327 10*3/uL (ref 150–400)
RBC: 3.23 MIL/uL — ABNORMAL LOW (ref 3.87–5.11)
RDW: 15.1 % (ref 11.5–15.5)
WBC: 7.6 10*3/uL (ref 4.0–10.5)
nRBC: 0 % (ref 0.0–0.2)

## 2020-12-21 LAB — BASIC METABOLIC PANEL
Anion gap: 10 (ref 5–15)
BUN: 14 mg/dL (ref 8–23)
CO2: 30 mmol/L (ref 22–32)
Calcium: 8.3 mg/dL — ABNORMAL LOW (ref 8.9–10.3)
Chloride: 97 mmol/L — ABNORMAL LOW (ref 98–111)
Creatinine, Ser: 0.98 mg/dL (ref 0.44–1.00)
GFR, Estimated: >60 mL/min (ref >60)
Glucose, Bld: 331 mg/dL — ABNORMAL HIGH (ref 70–99)
Potassium: 3.9 mmol/L (ref 3.5–5.1)
Sodium: 137 mmol/L (ref 135–145)

## 2020-12-21 LAB — GLUCOSE, CAPILLARY
Glucose-Capillary: 252 mg/dL — ABNORMAL HIGH (ref 70–99)
Glucose-Capillary: 257 mg/dL — ABNORMAL HIGH (ref 70–99)
Glucose-Capillary: 256 mg/dL — ABNORMAL HIGH (ref 70–99)

## 2020-12-21 LAB — SARS CORONAVIRUS 2 (TAT 6-24 HRS): SARS Coronavirus 2: NEGATIVE

## 2020-12-21 LAB — LACTIC ACID, PLASMA: Lactic Acid, Venous: 2 mmol/L (ref 0.5–1.9)

## 2020-12-21 NOTE — Discharge Instructions (Signed)
171, P1-P2. Online version updated July 2020.Retrieved from [email or web address]">  Hypoxemia  Hypoxemia happens when the blood does not have enough oxygen in it. Every part of the body needs oxygen to work well. Oxygen enters the lungs when a person breathes in and then it travels to all parts of the body through the blood. Hypoxemia can develop suddenly or slowly and can be mild to severe. What are the causes? Causes of hypoxemia may include:  Lung conditions. These may include: ? Long-term (chronic) lung disease, such as:  Asthma.  Chronic obstructive pulmonary disease (COPD).  Interstitial lung disease. ? Problems that affect breathing at night, such as sleep apnea. ? Fluid buildup in the lungs. ? Lung infection (pneumonia). ? Lung or throat cancer. ? A collapsed lung.  Heart or blood vessel (vascular) conditions, such as: ? A blood clot in the lungs (pulmonary embolus). ? Certain types of heart disease.  Other causes may include: ? Certain diseases that affect nerves or muscles. ? Slow or shallow breathing due to being very overweight (obesity hypoventilation). ? High altitudes, as there is less oxygen in the air. ? Toxic chemicals, smoke, and gases. What are the signs or symptoms? In some cases, there may be no symptoms of this condition. If you do have symptoms, they may include:  Shortness of breath.  Breathing that is fast, noisy, or shallow.  Bluish color of the skin, lips, or nail beds.  A fast heartbeat.  Feeling tired or sleepy.  Feeling confused or agitated. If hypoxemia develops quickly, it is likely you will suddenly have trouble breathing. If hypoxemia develops slowly over months or years, you may not notice any symptoms. How is this diagnosed? This condition is diagnosed by:  A physical exam.  A blood test that measures the amount of oxygen in your blood.  A test that measures the  percentage of oxygen in your blood (pulse oximetry). This is done by placing a sensor on your finger, toe, or earlobe. How is this treated? Treatment for this condition depends on the cause or the severity of your hypoxemia.  You will likely be treated with oxygen therapy to restore your blood oxygen level.  You may need oxygen therapy for a short time, such as weeks or months, or you may need it for the rest of your life.  You may be asked to lay on your stomach (prone). This may help bring your oxygen level up or help you feel less short of breath. Your health care provider may also recommend other therapies to treat the underlying cause of your hypoxemia. Follow these instructions at home:  Take over-the-counter and prescription medicines only as told by your health care provider.  If you are on oxygen therapy, follow oxygen safety precautions as directed by your health care provider. Precautions may include: ? Always have a backup supply of oxygen. ? Do not let anyone smoke or have a fire near your oxygen supply. ? Handle oxygen tanks carefully as told by your health care provider.  Do not use any products that contain nicotine or tobacco, such as cigarettes, e-cigarettes, and chewing tobacco. If you need help quitting, ask your health care provider. Stay away from people who smoke.  Keep all follow-up visits as told by your health care provider. This is important.   Contact a health care provider if:  You have any concerns about your oxygen therapy.  You have trouble breathing, even while wearing an oxygen supply.  You become short of breath  when you exercise.  You are still tired or have a headache when you wake up. Get help right away if:  Your shortness of breath gets worse, especially with normal activity or only a little bit of activity.  Your skin, lips, or nail beds are a bluish color.  You become confused or you cannot think properly.  You have chest pain.  You  have a fever. These symptoms may represent a serious problem that is an emergency. Do not wait to see if the symptoms will go away. Get medical help right away. Call your local emergency services (911 in the [place] Do not drive yourself to the hospital. Summary  Hypoxemia occurs when the blood does not contain enough oxygen.  Hypoxemia may or may not cause symptoms. Often, the main symptom is shortness of breath..  Depending on the cause of your hypoxemia, you may need oxygen therapy for a short time, such as weeks or months, or you may need it for the rest of your life.  If you are on oxygen therapy, follow oxygen safety precautions as directed by your health care provider. This information is not intended to replace advice given to you by your health care provider. Make sure you discuss any questions you have with your health care provider. Document Revised: 09/21/2019 Document Reviewed: 09/21/2019 Elsevier Patient Education  2021 [employer].

## 2020-12-21 NOTE — TOC Transition Note (Addendum)
Transition of Care [facility]) - CM/SW Discharge Note   Patient Details  Name: Lindsey Collins MRN: [id number] Date of Birth: [birth year]  Transition of Care [facility] CM/SW Contact:  Konrad Penta, RN Phone Number: [phone] 12/21/2020, 1:34 PM   Clinical Narrative:   Patient is slated to return to [facility] today. Spoke with Raquel Sarna from [place] who states they were expecting patient to return. States she will Secondary school teacher back with room number, doctor, and telephone number for bedside nurse to call with report.  Made MD aware that EMTALA form will be needed. Also informed by MD that [facility] will be required.   Will await for return call from [place] from [employer] with necessary information.   Addendum 1431- voicemail message left for Raquel Sarna [phone] with [facility] to request information for patient's return to [facility].     Final next level of care: Long Term Acute Care (LTAC) Barriers to Discharge: No Barriers Identified   Patient Goals and CMS Choice        Discharge Placement                       Discharge Plan and Services                                     Social Determinants of Health (SDOH) Interventions     Readmission Risk Interventions No flowsheet data found.    Marthenia Rolling, MSN, RN,BSN Inpatient [facility] Case Manager [phone]

## 2020-12-21 NOTE — Progress Notes (Signed)
Report called to Vevelyn Francois, RN. [facility] receiving Nurse. Questions answered, no concerns expressed. Daughter Rosely Fernandez phoned and updated. [facility] contacted and patient is being transferred out of floor via stretcher; Alert, Oriented, and Conversant at baseline. Leveda Anna, BSN, RN

## 2020-12-21 NOTE — Care Management (Addendum)
Received information from [place] with [facility] for patient to return.  Patient is returning to [facility]. Room number 41 Admitting MD: Dr. Manson Allan Telephone number to call for report is [phone].  Provided this information to nursing and also left paperwork for [facility] at the nurses station.     Marthenia Rolling, MSN, RN,BSN Inpatient [facility] Case Manager [phone]

## 2020-12-21 NOTE — Discharge Summary (Signed)
Discharge Summary  Lindsey Collins PPJ:[id number] DOB: December 11, 1945  PCP: [name], MD  Admit date: 12/20/2020 Discharge date: 12/21/2020  Time spent: 35 minutes.  Recommendations for Outpatient Follow-up:  1. Resume treatments at [facility].  Discharge Diagnoses:  Active Hospital Problems   Diagnosis Date Noted  . Elevated troponin 12/20/2020  . Chronic respiratory failure with hypoxia ([place]) 12/20/2020  . Status post tracheostomy ([place]) 12/20/2020  . Normocytic anemia 12/20/2020  . Parkinson's disease ([place]) 12/20/2020  . Diabetes mellitus type 2, uncontrolled ([place]) 12/20/2020    Resolved Hospital Problems  No resolved problems to display.    Discharge Condition: Stable  Diet recommendation: Resume previous diet.  Vitals:   12/21/20 0700 12/21/20 0859  BP: 116/65   Pulse: 69 71  Resp: 17 18  Temp:    SpO2: 97% 97%    History of present illness:  Lindsey Collins is a 75 y.o. female with medical history significant of chronic respiratory failure trach dependent, diastolic CHF, T2 DM, HTN, Parkinson's disease, MRSA pneumonia, and bipolar disorder who reportedly was found to be acutely altered 3 days ago with respiratory distress.  Apparently rapid response was called for acute desaturations and she required bag masking temporarily and heart rates were noted to be in the 30s-40s.  Suspected possibly secondary  to mucous plugging.  Thereafter CT imaging of the head and chest showed no acute abnormalities.  Patient was noted to be altered for approximately 2 hours prior to returning back to her baseline.  She was able to be placed back on Passy-Muir valve in the T-bar.  Labs werel reported to show a troponin < 0.5, 26, and <0.5.  It was advised that the EKG showed ST wave depressions concerning for inferior ischemia.  Case had been discussed with cardiology who recommended a medical admission.  Patient had been accepted to a progressive bed.  Denies any reports of chest pain,  palpitations, shortness of breath, or any other complaints at this time.  Patient does not recall what happened when rapid response was called.  Patient just recently hospitalized from 3/22-3/25 at [facility] for serrata pneumonia along with positive blood cultures for actinomyces odontolytics and strep parasanguinis.  She had been discharged to [facility] facility for vent weaning.  She was treated with a 7-day course of cefepime that ended on 3/28.  During her hospitalization it appears she had echocardiogram performed on 3/23 that showed preserved EF.  12/21/20: Seen and examined at her bedside.  She denies any chest pain, dyspnea, cough, or palpitations.  Denies any abdominal pain or nausea.  Last bowel movement was yesterday, no diarrhea.  She has no new complaints.   Hospital Course:  Principal Problem:   Elevated troponin Active Problems:   Chronic respiratory failure with hypoxia (HCC)   Status post tracheostomy (HCC)   Normocytic anemia   Parkinson's disease (HCC)   Diabetes mellitus type 2, uncontrolled (HCC)   Elevated troponin: Resolved   Patient was reported to have elevated troponin at the facility with abnormal EKG.  EKG appears similar to previous.  High-sensitivity troponin here at the hospital is negative.  Patient without any complaints of chest pain and echocardiogram performed from 3/23 noted normal EF.  Cardiology have been formally consulted but recommended no further work-up. Has denied any anginal symptoms at the time of this visit. Seen by cardiology, recommended to continue current therapy, signed off.  Chronic respiratory failure with hypoxia status post trach  Patient currently on trach  collar and maintaining O2 saturations without any issues.   Chest x-ray from outside facility noted to be clear.  Question the possibility of mucous plugging as a cause for her acute desaturation on 4/5. -Continuous pulse oximetry -Respiratory therapy  consulted -Pulmonary toiletry as needed  Lactic acidosis: Acute.   On admission lactic acid elevated at 2.3.   Repeated lactic down trending 2.0 Patient had just recently been admitted to the hospital for sepsis with concern for pneumonia, bacteremia, and possible UTI.  Question of symptoms secondary to patient being dehydrated. Non toxic appearing Afebrile No complaints.  She is alert and oriented x 3.  History of arrhythmia There was concern for undocumented arrhythmia prior to patient being admitted to the hospital at the end of March.  Home medications include amiodarone 200 mg daily, Eliquis 5 mg twice daily, and metoprolol 25 mg twice daily. -Continue current regimen  Chronic diastolic CHF Echocardiogram revealed normal EF with some diastolic dysfunction.  Home medication regimen includes Lasix 60 mg p.o. twice daily. -Initially held to be resumed in a.m. as patient appeared to be possibly dry -Check BNP(53.6)  Diabetes mellitus type 2, uncontrolled  Last hemoglobin A1c was noted to be 7.9 about 4 months ago.  Blood sugars were initially elevated into the 200s -Hypoglycemic protocol -Check hemoglobin A1c (7.2)  -Continue current regimen  Normocytic anemia Stable.  Hemoglobin 11 g/dL which appears near patient's baseline. No overt bleeding  Essential hypertension -Continue Lopressor 25 mg twice daily  Parkinson's disease -Continue Sinemet and Namenda   Bipolar disorder depression/anxiety -Continue olanzapine and fluoxetine  Hyperlipidemia -Continue atorvastatin  Gout -Continue allopurinol     DVT prophylaxis: Eliquis Code Status: Full  Consults called: Cardiology, signed off.   Discharge Exam: BP 116/65 (BP Location: Right Leg)   Pulse 71   Temp 97.8 F (36.6 C) (Oral)   Resp 18   SpO2 97%  . General: 75 y.o. year-old female well developed well nourished in no acute distress.  Alert and oriented x3. . Cardiovascular: Regular rate and  rhythm with no rubs or gallops.  No thyromegaly or JVD noted.   Marland Kitchen Respiratory: Clear to auscultation with no wheezes or rales. Good inspiratory effort.  Trach collar in place. . Abdomen: Soft nontender nondistended with normal bowel sounds x4 quadrants. . Musculoskeletal: No lower extremity edema.  . Skin: No ulcerative lesions noted or rashes . Psychiatry: Mood is appropriate for condition and setting  Discharge Instructions You were cared for by a hospitalist during your hospital stay. If you have any questions about your discharge medications or the care you received while you were in the hospital after you are discharged, you can call the unit and asked to speak with the hospitalist on call if the hospitalist that took care of you is not available. Once you are discharged, your primary care physician will handle any further medical issues. Please note that NO REFILLS for any discharge medications will be authorized once you are discharged, as it is imperative that you return to your primary care physician (or establish a relationship with a primary care physician if you do not have one) for your aftercare needs so that they can reassess your need for medications and monitor your lab values.   Allergies as of 12/21/2020   No Known Allergies     Medication List    TAKE these medications   allopurinol 100 MG tablet Commonly known as: ZYLOPRIM Take 200 mg by mouth daily.   amiodarone 200 MG  tablet Commonly known as: PACERONE Take 200 mg by mouth daily.   atorvastatin 20 MG tablet Commonly known as: LIPITOR Take 20 mg by mouth at bedtime.   carbidopa-levodopa 25-100 MG tablet Commonly known as: SINEMET IR Take 1 tablet by mouth every 6 (six) hours.   chlorhexidine 0.12 % solution Commonly known as: PERIDEX Use as directed 15 mLs in the mouth or throat 2 (two) times daily.   cholecalciferol 25 MCG (1000 UNIT) tablet Commonly known as: VITAMIN D3 Take 1,000 Units by mouth daily.    divalproex 125 MG capsule Commonly known as: DEPAKOTE SPRINKLE Take 500 mg by mouth 2 (two) times daily.   Eliquis 5 MG Tabs tablet Generic drug: apixaban Take 1 tablet by mouth 2 (two) times daily.   FLUoxetine 10 MG tablet Commonly known as: PROZAC Take 10 mg by mouth daily.   furosemide 20 MG tablet Commonly known as: LASIX Take 60 mg by mouth every 12 (twelve) hours.   insulin glargine 100 UNIT/ML injection Commonly known as: LANTUS Inject 34 Units into the skin at bedtime.   insulin lispro 100 UNIT/ML injection Commonly known as: HUMALOG Inject into the skin 4 (four) times daily -  before meals and at bedtime.   ipratropium-albuterol 0.5-2.5 (3) MG/3ML Soln Commonly known as: DUONEB Take 3 mLs by nebulization every 4 (four) hours as needed (shortness of breath).   magnesium oxide 400 MG tablet Commonly known as: MAG-OX Take 400 mg by mouth 2 (two) times daily.   melatonin 3 MG Tabs tablet Take 6 mg by mouth at bedtime.   memantine 5 MG tablet Commonly known as: NAMENDA Take 5 mg by mouth 2 (two) times daily.   metoprolol tartrate 25 MG tablet Commonly known as: LOPRESSOR Take 25 mg by mouth 2 (two) times daily.   miconazole 2 % powder Commonly known as: MICOTIN Apply 1 application topically every 12 (twelve) hours.   OLANZapine 10 MG tablet Commonly known as: ZYPREXA Take 10 mg by mouth at bedtime.   ondansetron 4 MG disintegrating tablet Commonly known as: ZOFRAN-ODT Take 4 mg by mouth every 8 (eight) hours as needed for nausea/vomiting.   pantoprazole 40 MG tablet Commonly known as: PROTONIX Take 40 mg by mouth daily.   polyethylene glycol 17 g packet Commonly known as: MIRALAX / GLYCOLAX Take 17 g by mouth daily.   potassium chloride 20 MEQ packet Commonly known as: KLOR-CON Take 40 mEq by mouth daily.   pramipexole 0.125 MG tablet Commonly known as: MIRAPEX Take 0.125 mg by mouth every 8 (eight) hours.   propylthiouracil 50 MG  tablet Commonly known as: PTU Take 50 mg by mouth every 12 (twelve) hours.   senna-docusate 8.6-50 MG tablet Commonly known as: Senokot-S Take 2 tablets by mouth at bedtime.      No Known Allergies  Follow-up Information    [name], MD. Call in 1 day(s).   Specialty: Internal Medicine Why: Please call for a post hospital follow-up appointment. Contact information: [place] [address] [phone]                The results of significant diagnostics from this hospitalization (including imaging, microbiology, ancillary and laboratory) are listed below for reference.    Significant Diagnostic Studies: DG CHEST PORT 1 VIEW  Result Date: 12/21/2020 CLINICAL DATA:  75 year old female with history of hypoxia. EXAM: PORTABLE CHEST 1 VIEW COMPARISON:  No priors. FINDINGS: Tracheostomy tube in position with tip terminating 6.5 cm  above the carina. Lung volumes are low. Bibasilar opacities (left greater than right) which may reflect areas of atelectasis and/or consolidation. Small left pleural effusion. No right pleural effusion. No pneumothorax. No evidence of pulmonary edema. Heart size is upper limits of normal. Upper mediastinal contours are within normal limits allowing for patient rotation to the left. Atherosclerotic calcifications in the thoracic aorta. IMPRESSION: 1. Low lung volumes with bibasilar areas of atelectasis and/or consolidation (left greater than right) and small left pleural effusion. 2. Aortic atherosclerosis. Electronically Signed   By: Vinnie Langton M.D.   On: 12/21/2020 08:01    Microbiology: Recent Results (from the past 240 hour(s))  Culture, blood (routine x 2)     Status: None (Preliminary result)   Collection Time: 12/20/20  1:15 PM   Specimen: BLOOD RIGHT ARM  Result Value Ref Range Status   Specimen Description BLOOD RIGHT ARM  Final   Special Requests   Final    BOTTLES DRAWN AEROBIC ONLY Blood Culture results may  not be optimal due to an inadequate volume of blood received in culture bottles   Culture   Final    NO GROWTH < 24 HOURS Performed at [facility], [place] [address] [place], [place] [address]    Report Status PENDING  Incomplete  Culture, blood (routine x 2)     Status: None (Preliminary result)   Collection Time: 12/20/20  1:24 PM   Specimen: BLOOD RIGHT HAND  Result Value Ref Range Status   Specimen Description BLOOD RIGHT HAND  Final   Special Requests   Final    BOTTLES DRAWN AEROBIC ONLY Blood Culture results may not be optimal due to an inadequate volume of blood received in culture bottles   Culture   Final    NO GROWTH < 24 HOURS Performed at [facility], [place] [address] [employer], [place] [address]    Report Status PENDING  Incomplete  SARS CORONAVIRUS 2 (TAT 6-24 HRS) Nasopharyngeal Nasopharyngeal Swab     Status: None   Collection Time: 12/21/20  4:35 AM   Specimen: Nasopharyngeal Swab  Result Value Ref Range Status   SARS Coronavirus 2 NEGATIVE NEGATIVE Final    Comment: (NOTE) SARS-CoV-2 target nucleic acids are NOT DETECTED.  The SARS-CoV-2 RNA is generally detectable in upper and lower respiratory specimens during the acute phase of infection. Negative results do not preclude SARS-CoV-2 infection, do not rule out co-infections with other pathogens, and should not be used as the sole basis for treatment or other patient management decisions. Negative results must be combined with clinical observations, patient history, and epidemiological information. The expected result is Negative.  Fact Sheet for Patients: SugarRoll.be  Fact Sheet for Healthcare Providers: https://www.woods-mathews.com/  This test is not yet approved or cleared by the [place] FDA and  has been authorized for detection and/or diagnosis of SARS-CoV-2 by FDA under an Emergency Use Authorization (EUA). This EUA will remain  in effect  (meaning this test can be used) for the duration of the COVID-19 declaration under Se ction 564(b)(1) of the Act, 21 U.S.C. section 360bbb-3(b)(1), unless the authorization is terminated or revoked sooner.  Performed at [facility], [place] [address] [place],  [address]      Labs: Basic Metabolic Panel: Recent Labs  Lab 12/20/20 1317 12/21/20 0300  NA 138 137  K 4.6 3.9  CL 96* 97*  CO2 31 30  GLUCOSE 177* 331*  BUN 14 14  CREATININE 0.87  0.98  CALCIUM 8.6* 8.3*   Liver Function Tests: Recent Labs  Lab 12/20/20 1317  AST 13*  ALT 7  ALKPHOS 51  BILITOT 0.5  PROT 6.7  ALBUMIN 3.1*   No results for input(s): LIPASE, AMYLASE in the last 168 hours. No results for input(s): AMMONIA in the last 168 hours. CBC: Recent Labs  Lab 12/20/20 1317 12/21/20 0300  WBC 6.7 7.6  NEUTROABS 2.9  --   HGB 11.0* 9.9*  HCT 34.7* 31.3*  MCV 96.7 96.9  PLT 336 327   Cardiac Enzymes: No results for input(s): CKTOTAL, CKMB, CKMBINDEX, TROPONINI in the last 168 hours. BNP: BNP (last 3 results) Recent Labs    12/20/20 1317  BNP 53.6    ProBNP (last 3 results) No results for input(s): PROBNP in the last 8760 hours.  CBG: Recent Labs  Lab 12/20/20 1642 12/20/20 2125 12/21/20 0716 12/21/20 1102  GLUCAP 254* 294* 252* 257*       Signed:  Kayleen Memos, MD [facility] 12/21/2020, 1:59 PM

## 2020-12-23 DIAGNOSIS — J9621 Acute and chronic respiratory failure with hypoxia: Secondary | ICD-10-CM | POA: Diagnosis not present

## 2020-12-23 DIAGNOSIS — I5032 Chronic diastolic (congestive) heart failure: Secondary | ICD-10-CM | POA: Diagnosis not present

## 2020-12-23 DIAGNOSIS — J15212 Pneumonia due to Methicillin resistant Staphylococcus aureus: Secondary | ICD-10-CM | POA: Diagnosis not present

## 2020-12-23 DIAGNOSIS — I480 Paroxysmal atrial fibrillation: Secondary | ICD-10-CM | POA: Diagnosis not present

## 2020-12-25 LAB — CULTURE, BLOOD (ROUTINE X 2)
Culture: NO GROWTH
Culture: NO GROWTH

## 2021-01-07 ENCOUNTER — Inpatient Hospital Stay (HOSPITAL_COMMUNITY): Payer: Medicare Other

## 2021-01-07 ENCOUNTER — Emergency Department (HOSPITAL_COMMUNITY): Payer: Medicare Other

## 2021-01-07 ENCOUNTER — Inpatient Hospital Stay (HOSPITAL_COMMUNITY)
Admission: EM | Admit: 2021-01-07 | Discharge: 2021-01-09 | DRG: 208 | Disposition: A | Discharge status: Other Acute Inpatient Hospital | Payer: Medicare Other | Source: Skilled Nursing Facility | Attending: Critical Care Medicine | Admitting: Critical Care Medicine

## 2021-01-07 DIAGNOSIS — J9602 Acute respiratory failure with hypercapnia: Secondary | ICD-10-CM | POA: Diagnosis not present

## 2021-01-07 DIAGNOSIS — J9622 Acute and chronic respiratory failure with hypercapnia: Secondary | ICD-10-CM | POA: Diagnosis present

## 2021-01-07 DIAGNOSIS — R4182 Altered mental status, unspecified: Secondary | ICD-10-CM | POA: Diagnosis present

## 2021-01-07 DIAGNOSIS — Z930 Tracheostomy status: Secondary | ICD-10-CM | POA: Diagnosis not present

## 2021-01-07 DIAGNOSIS — Z6836 Body mass index (BMI) 36.0-36.9, adult: Secondary | ICD-10-CM | POA: Diagnosis not present

## 2021-01-07 DIAGNOSIS — G9341 Metabolic encephalopathy: Secondary | ICD-10-CM | POA: Diagnosis present

## 2021-01-07 DIAGNOSIS — N182 Chronic kidney disease, stage 2 (mild): Secondary | ICD-10-CM | POA: Diagnosis not present

## 2021-01-07 DIAGNOSIS — J189 Pneumonia, unspecified organism: Secondary | ICD-10-CM | POA: Diagnosis not present

## 2021-01-07 DIAGNOSIS — Z794 Long term (current) use of insulin: Secondary | ICD-10-CM | POA: Diagnosis not present

## 2021-01-07 DIAGNOSIS — E1122 Type 2 diabetes mellitus with diabetic chronic kidney disease: Secondary | ICD-10-CM | POA: Diagnosis not present

## 2021-01-07 DIAGNOSIS — D539 Nutritional anemia, unspecified: Secondary | ICD-10-CM | POA: Diagnosis not present

## 2021-01-07 DIAGNOSIS — Z79899 Other long term (current) drug therapy: Secondary | ICD-10-CM

## 2021-01-07 DIAGNOSIS — E876 Hypokalemia: Secondary | ICD-10-CM | POA: Diagnosis present

## 2021-01-07 DIAGNOSIS — G20 Parkinson's disease: Secondary | ICD-10-CM | POA: Diagnosis present

## 2021-01-07 DIAGNOSIS — E1165 Type 2 diabetes mellitus with hyperglycemia: Secondary | ICD-10-CM | POA: Diagnosis present

## 2021-01-07 DIAGNOSIS — J9621 Acute and chronic respiratory failure with hypoxia: Secondary | ICD-10-CM | POA: Diagnosis present

## 2021-01-07 DIAGNOSIS — E872 Acidosis: Secondary | ICD-10-CM | POA: Diagnosis present

## 2021-01-07 DIAGNOSIS — F319 Bipolar disorder, unspecified: Secondary | ICD-10-CM | POA: Diagnosis not present

## 2021-01-07 DIAGNOSIS — Z20822 Contact with and (suspected) exposure to covid-19: Secondary | ICD-10-CM | POA: Diagnosis not present

## 2021-01-07 DIAGNOSIS — J156 Pneumonia due to other aerobic Gram-negative bacteria: Secondary | ICD-10-CM | POA: Diagnosis present

## 2021-01-07 DIAGNOSIS — Z7901 Long term (current) use of anticoagulants: Secondary | ICD-10-CM | POA: Diagnosis not present

## 2021-01-07 DIAGNOSIS — I5032 Chronic diastolic (congestive) heart failure: Secondary | ICD-10-CM | POA: Diagnosis present

## 2021-01-07 LAB — COMPREHENSIVE METABOLIC PANEL
ALT: 9 U/L (ref 0–44)
AST: 24 U/L (ref 15–41)
Albumin: 2.9 g/dL — ABNORMAL LOW (ref 3.5–5.0)
Alkaline Phosphatase: 60 U/L (ref 38–126)
Anion gap: 8 (ref 5–15)
BUN: 14 mg/dL (ref 8–23)
CO2: 39 mmol/L — ABNORMAL HIGH (ref 22–32)
Calcium: 8.6 mg/dL — ABNORMAL LOW (ref 8.9–10.3)
Chloride: 90 mmol/L — ABNORMAL LOW (ref 98–111)
Creatinine, Ser: 1.09 mg/dL — ABNORMAL HIGH (ref 0.44–1.00)
GFR, Estimated: 53 mL/min — ABNORMAL LOW (ref >60)
Glucose, Bld: 290 mg/dL — ABNORMAL HIGH (ref 70–99)
Potassium: 5.6 mmol/L — ABNORMAL HIGH (ref 3.5–5.1)
Sodium: 137 mmol/L (ref 135–145)
Total Bilirubin: 0.8 mg/dL (ref 0.3–1.2)
Total Protein: 6.6 g/dL (ref 6.5–8.1)

## 2021-01-07 LAB — I-STAT ARTERIAL BLOOD GAS, ED
Acid-Base Excess: 11 mmol/L — ABNORMAL HIGH (ref 0.0–2.0)
Acid-Base Excess: 10 mmol/L — ABNORMAL HIGH (ref 0.0–2.0)
Bicarbonate: 38.7 mmol/L — ABNORMAL HIGH (ref 20.0–28.0)
Bicarbonate: 38.1 mmol/L — ABNORMAL HIGH (ref 20.0–28.0)
Calcium, Ion: 1.13 mmol/L — ABNORMAL LOW (ref 1.15–1.40)
Calcium, Ion: 1.15 mmol/L (ref 1.15–1.40)
HCT: 35 % — ABNORMAL LOW (ref 36.0–46.0)
HCT: 34 % — ABNORMAL LOW (ref 36.0–46.0)
Hemoglobin: 11.9 g/dL — ABNORMAL LOW (ref 12.0–15.0)
Hemoglobin: 11.6 g/dL — ABNORMAL LOW (ref 12.0–15.0)
O2 Saturation: 89 %
O2 Saturation: 89 %
Patient temperature: 101
Patient temperature: 101
Potassium: 4.4 mmol/L (ref 3.5–5.1)
Potassium: 4.6 mmol/L (ref 3.5–5.1)
Sodium: 136 mmol/L (ref 135–145)
Sodium: 138 mmol/L (ref 135–145)
TCO2: 41 mmol/L — ABNORMAL HIGH (ref 22–32)
TCO2: 40 mmol/L — ABNORMAL HIGH (ref 22–32)
pCO2 arterial: 67.2 mmHg (ref 32.0–48.0)
pCO2 arterial: 74.2 mmHg (ref 32.0–48.0)
pH, Arterial: 7.374 (ref 7.350–7.450)
pH, Arterial: 7.324 — ABNORMAL LOW (ref 7.350–7.450)
pO2, Arterial: 65 mmHg — ABNORMAL LOW (ref 83.0–108.0)
pO2, Arterial: 69 mmHg — ABNORMAL LOW (ref 83.0–108.0)

## 2021-01-07 LAB — CBC WITH DIFFERENTIAL/PLATELET
Abs Immature Granulocytes: 0.33 10*3/uL — ABNORMAL HIGH (ref 0.00–0.07)
Basophils Absolute: 0.1 10*3/uL (ref 0.0–0.1)
Basophils Relative: 0 %
Eosinophils Absolute: 0 10*3/uL (ref 0.0–0.5)
Eosinophils Relative: 0 %
HCT: 37.5 % (ref 36.0–46.0)
Hemoglobin: 11.2 g/dL — ABNORMAL LOW (ref 12.0–15.0)
Immature Granulocytes: 1 %
Lymphocytes Relative: 6 %
Lymphs Abs: 1.3 10*3/uL (ref 0.7–4.0)
MCH: 30.2 pg (ref 26.0–34.0)
MCHC: 29.9 g/dL — ABNORMAL LOW (ref 30.0–36.0)
MCV: 101.1 fL — ABNORMAL HIGH (ref 80.0–100.0)
Monocytes Absolute: 0.9 10*3/uL (ref 0.1–1.0)
Monocytes Relative: 4 %
Neutro Abs: 20.6 10*3/uL — ABNORMAL HIGH (ref 1.7–7.7)
Neutrophils Relative %: 89 %
Platelets: 264 10*3/uL (ref 150–400)
RBC: 3.71 MIL/uL — ABNORMAL LOW (ref 3.87–5.11)
RDW: 15.6 % — ABNORMAL HIGH (ref 11.5–15.5)
WBC: 23.2 10*3/uL — ABNORMAL HIGH (ref 4.0–10.5)
nRBC: 0.1 % (ref 0.0–0.2)

## 2021-01-07 LAB — URINALYSIS, ROUTINE W REFLEX MICROSCOPIC
Bilirubin Urine: NEGATIVE
Glucose, UA: 50 mg/dL — AB
Hgb urine dipstick: NEGATIVE
Ketones, ur: 5 mg/dL — AB
Leukocytes,Ua: NEGATIVE
Nitrite: NEGATIVE
Protein, ur: NEGATIVE mg/dL
Specific Gravity, Urine: 1.016 (ref 1.005–1.030)
pH: 7 (ref 5.0–8.0)

## 2021-01-07 LAB — BASIC METABOLIC PANEL
Anion gap: 11 (ref 5–15)
BUN: 15 mg/dL (ref 8–23)
CO2: 33 mmol/L — ABNORMAL HIGH (ref 22–32)
Calcium: 8.3 mg/dL — ABNORMAL LOW (ref 8.9–10.3)
Chloride: 95 mmol/L — ABNORMAL LOW (ref 98–111)
Creatinine, Ser: 0.86 mg/dL (ref 0.44–1.00)
GFR, Estimated: >60 mL/min (ref >60)
Glucose, Bld: 178 mg/dL — ABNORMAL HIGH (ref 70–99)
Potassium: 3.9 mmol/L (ref 3.5–5.1)
Sodium: 139 mmol/L (ref 135–145)

## 2021-01-07 LAB — GLUCOSE, CAPILLARY
Glucose-Capillary: 102 mg/dL — ABNORMAL HIGH (ref 70–99)
Glucose-Capillary: 114 mg/dL — ABNORMAL HIGH (ref 70–99)
Glucose-Capillary: 184 mg/dL — ABNORMAL HIGH (ref 70–99)
Glucose-Capillary: 250 mg/dL — ABNORMAL HIGH (ref 70–99)
Glucose-Capillary: 311 mg/dL — ABNORMAL HIGH (ref 70–99)
Glucose-Capillary: 97 mg/dL (ref 70–99)

## 2021-01-07 LAB — PROTIME-INR
INR: 1.2 (ref 0.8–1.2)
Prothrombin Time: 15.2 seconds (ref 11.4–15.2)

## 2021-01-07 LAB — RESP PANEL BY RT-PCR (FLU A&B, COVID) ARPGX2
Influenza A by PCR: NEGATIVE
Influenza B by PCR: NEGATIVE
SARS Coronavirus 2 by RT PCR: NEGATIVE

## 2021-01-07 LAB — OCCULT BLOOD X 1 CARD TO LAB, STOOL: Fecal Occult Bld: NEGATIVE

## 2021-01-07 LAB — APTT: aPTT: 30 seconds (ref 24–36)

## 2021-01-07 LAB — LACTIC ACID, PLASMA
Lactic Acid, Venous: 2.6 mmol/L (ref 0.5–1.9)
Lactic Acid, Venous: 5.2 mmol/L (ref 0.5–1.9)
Lactic Acid, Venous: 1.6 mmol/L (ref 0.5–1.9)

## 2021-01-07 LAB — VALPROIC ACID LEVEL: Valproic Acid Lvl: 60 ug/mL (ref 50.0–100.0)

## 2021-01-07 LAB — BRAIN NATRIURETIC PEPTIDE: B Natriuretic Peptide: 71.4 pg/mL (ref 0.0–100.0)

## 2021-01-07 LAB — MRSA PCR SCREENING: MRSA by PCR: POSITIVE — AB

## 2021-01-07 LAB — CBG MONITORING, ED: Glucose-Capillary: 260 mg/dL — ABNORMAL HIGH (ref 70–99)

## 2021-01-07 MED ORDER — INSULIN ASPART 100 UNIT/ML ~~LOC~~ SOLN
0.0000 [IU] | SUBCUTANEOUS | Status: DC
  Administered 2021-01-07: 5 [IU] via SUBCUTANEOUS
  Administered 2021-01-07: 11 [IU] via SUBCUTANEOUS
  Administered 2021-01-07 – 2021-01-08 (×4): 3 [IU] via SUBCUTANEOUS
  Administered 2021-01-08: 2 [IU] via SUBCUTANEOUS
  Administered 2021-01-08: 3 [IU] via SUBCUTANEOUS
  Administered 2021-01-08: 2 [IU] via SUBCUTANEOUS
  Administered 2021-01-09 (×2): 5 [IU] via SUBCUTANEOUS
  Administered 2021-01-09: 3 [IU] via SUBCUTANEOUS

## 2021-01-07 MED ORDER — SODIUM CHLORIDE 0.9 % IV SOLN: INTRAVENOUS | Status: DC

## 2021-01-07 MED ORDER — LACTATED RINGERS IV BOLUS (SEPSIS)
1000.0000 mL | Freq: Once | INTRAVENOUS | Status: AC
  Administered 2021-01-07: 1000 mL via INTRAVENOUS

## 2021-01-07 MED ORDER — PROSOURCE TF PO LIQD
45.0000 mL | Freq: Two times a day (BID) | ORAL | Status: DC
  Filled 2021-01-07: qty 45

## 2021-01-07 MED ORDER — LACTATED RINGERS IV SOLN: INTRAVENOUS | Status: DC

## 2021-01-07 MED ORDER — SODIUM ZIRCONIUM CYCLOSILICATE 10 G PO PACK: 10.0000 g | PACK | Freq: Once | ORAL | Status: DC

## 2021-01-07 MED ORDER — APIXABAN 5 MG PO TABS: 5.0000 mg | ORAL_TABLET | Freq: Two times a day (BID) | ORAL | Status: DC

## 2021-01-07 MED ORDER — ENOXAPARIN SODIUM 60 MG/0.6ML ~~LOC~~ SOLN
0.5000 mg/kg | SUBCUTANEOUS | Status: DC
  Administered 2021-01-07: 45 mg via SUBCUTANEOUS
  Filled 2021-01-07: qty 0.6

## 2021-01-07 MED ORDER — SODIUM CHLORIDE 0.9 % IV SOLN
2.0000 g | Freq: Once | INTRAVENOUS | Status: AC
  Administered 2021-01-07: 2 g via INTRAVENOUS
  Filled 2021-01-07: qty 2

## 2021-01-07 MED ORDER — VANCOMYCIN HCL 750 MG/150ML IV SOLN
750.0000 mg | INTRAVENOUS | Status: DC
  Filled 2021-01-07: qty 150

## 2021-01-07 MED ORDER — HEPARIN SODIUM (PORCINE) 5000 UNIT/ML IJ SOLN: 5000.0000 [IU] | Freq: Three times a day (TID) | INTRAMUSCULAR | Status: DC

## 2021-01-07 MED ORDER — CHLORHEXIDINE GLUCONATE CLOTH 2 % EX PADS
6.0000 | MEDICATED_PAD | Freq: Every day | CUTANEOUS | Status: DC
  Administered 2021-01-07 – 2021-01-09 (×3): 6 via TOPICAL

## 2021-01-07 MED ORDER — VITAL HIGH PROTEIN PO LIQD: 1000.0000 mL | ORAL | Status: DC

## 2021-01-07 MED ORDER — SODIUM CHLORIDE 0.9 % IV SOLN
2.0000 g | Freq: Two times a day (BID) | INTRAVENOUS | Status: DC
  Administered 2021-01-07: 2 g via INTRAVENOUS
  Filled 2021-01-07 (×2): qty 2

## 2021-01-07 MED ORDER — MUPIROCIN 2 % EX OINT
1.0000 "application " | TOPICAL_OINTMENT | Freq: Two times a day (BID) | CUTANEOUS | Status: DC
  Administered 2021-01-07 – 2021-01-09 (×5): 1 via NASAL
  Filled 2021-01-07: qty 22

## 2021-01-07 MED ORDER — POLYETHYLENE GLYCOL 3350 17 G PO PACK: 17.0000 g | PACK | Freq: Every day | ORAL | Status: DC | PRN

## 2021-01-07 MED ORDER — DOCUSATE SODIUM 50 MG/5ML PO LIQD: 100.0000 mg | Freq: Two times a day (BID) | ORAL | Status: DC | PRN

## 2021-01-07 MED ORDER — PANTOPRAZOLE SODIUM 40 MG IV SOLR
40.0000 mg | Freq: Two times a day (BID) | INTRAVENOUS | Status: DC
  Administered 2021-01-07 (×2): 40 mg via INTRAVENOUS
  Filled 2021-01-07 (×2): qty 40

## 2021-01-07 MED ORDER — SODIUM CHLORIDE 0.9 % IV SOLN
500.0000 mg | Freq: Once | INTRAVENOUS | Status: AC
  Administered 2021-01-07: 500 mg via INTRAVENOUS
  Filled 2021-01-07: qty 500

## 2021-01-07 MED ORDER — VANCOMYCIN HCL 1500 MG/300ML IV SOLN
1500.0000 mg | Freq: Once | INTRAVENOUS | Status: AC
  Administered 2021-01-07: 1500 mg via INTRAVENOUS
  Filled 2021-01-07 (×2): qty 300

## 2021-01-07 MED ORDER — LACTATED RINGERS IV BOLUS (SEPSIS): 500.0000 mL | Freq: Once | INTRAVENOUS | Status: DC

## 2021-01-07 MED ORDER — DOCUSATE SODIUM 100 MG PO CAPS: 100.0000 mg | ORAL_CAPSULE | Freq: Two times a day (BID) | ORAL | Status: DC | PRN

## 2021-01-07 MED ORDER — ACETAMINOPHEN 650 MG RE SUPP
650.0000 mg | Freq: Once | RECTAL | Status: AC
  Administered 2021-01-07: 650 mg via RECTAL
  Filled 2021-01-07: qty 1

## 2021-01-07 MED ORDER — INSULIN GLARGINE 100 UNIT/ML ~~LOC~~ SOLN
36.0000 [IU] | Freq: Every day | SUBCUTANEOUS | Status: DC
  Filled 2021-01-07: qty 0.36

## 2021-01-07 MED ORDER — SODIUM CHLORIDE 0.9 % IV BOLUS
1000.0000 mL | Freq: Once | INTRAVENOUS | Status: AC
  Administered 2021-01-07: 1000 mL via INTRAVENOUS

## 2021-01-07 NOTE — H&P (Signed)
NAME:  Lindsey Collins, MRN:  [id number], DOB:  [birth year], LOS: 0 ADMISSION DATE:  01/07/2021, CONSULTATION DATE: January 07, 2021 REFERRING MD: [facility] emergency room here, CHIEF COMPLAINT: Fever shortness of breath altered mental status  History of Present Illness:  Patient is a 75 years old Caucasian female morbidly obese bees has been on trach for the past 2 years after MRSA pneumonia she is at [facility] for rehab she has chronic respiratory failure now trach dependent diastolic CHF chronic type 2 diabetes Parkinson disease bipolar disorder she was found to have acute altered mental status with respiratory distress fever was sent to the ED temperature was found to be 101 she was brought back to the ventilator through her trach we have Reviewed that she has right upper lobe pneumonia so she is admitted to the ICU last month March end of March patient was found to have Serratia pneumonia she was treated with cefepime she had an episode of tachycardia at that time which was most likely related to sepsis  Pertinent  Medical History  Tracheostomy.  Obesity.  Type 2 diabetes.  Parkinson.  Bipolar.  Chronic trach.  Chronic respiratory failure   Objective   Blood pressure (!) 125/58, pulse (!) 107, temperature (!) 101 F (38.3 C), resp. rate 18, height 5\' 5"  (1.651 m), SpO2 100 %.    Vent Mode: PRVC FiO2 (%):  [50 %-60 %] 50 % Set Rate:  [18 bmp] 18 bmp Vt Set:  [450 mL] 450 mL PEEP:  [5 cmH20] 5 cmH20 Plateau Pressure:  [28 cmH20] 28 cmH20  No intake or output data in the 24 hours ending 01/07/21 0257 There were no vitals filed for this visit.  Examination: General:   Well-nourished no acute distress on trach HEENT: normal Lymph: no adenopathy Neck:  Trach site seems to be cleared Endocrine:  No thryomegaly Vascular: No carotid bruits; 4/4 extremity pulses 2+   Cardiac:  normal S1, S2; RRR; no murmur  Lungs:   Expiratory crackles right upper lobe Abd: soft, nontender, no  hepatomegaly  Ext: no edema Musculoskeletal:  No deformities, BUE and BLE strength but equal Skin: warm and dry  Neuro:  Moving upper lower extremities without limitations   Assessment & Plan:   -- Acute on top of CHRONIC respiratory failure most likely due to infection fever encephalopathy at this point per care back up to the vent and wait for culture start antibiotic aggressive IV fluid resuscitation.  --Chronic respiratory failure status post trach seem to be dependent and rehab back at [facility] when she is more stable  --History of Serratia pneumonia she was treated with cefepime we will restart that and will get cultures back   -- Hypokalemia that was repleted normal potassium at this point repeat labs in the morning  --Call us with question thank you for this consultation.   Labs   CBC: Recent Labs  Lab 01/07/21 0156  HGB 11.9*  HCT 35.0*    Basic Metabolic Panel: Recent Labs  Lab 01/07/21 0156  NA 136  K 4.4   GFR: CrCl cannot be calculated (Unknown ideal weight.). No results for input(s): PROCALCITON, WBC, LATICACIDVEN in the last 168 hours.  Liver Function Tests: No results for input(s): AST, ALT, ALKPHOS, BILITOT, PROT, ALBUMIN in the last 168 hours. No results for input(s): LIPASE, AMYLASE in the last 168 hours. No results for input(s): AMMONIA in the last 168 hours.  ABG    Component Value Date/Time   PHART  7.374 01/07/2021 0156   PCO2ART 67.2 (HH) 01/07/2021 0156   PO2ART 65 (L) 01/07/2021 0156   HCO3 38.7 (H) 01/07/2021 0156   TCO2 41 (H) 01/07/2021 0156   O2SAT 89.0 01/07/2021 0156     Coagulation Profile: No results for input(s): INR, PROTIME in the last 168 hours.  Cardiac Enzymes: No results for input(s): CKTOTAL, CKMB, CKMBINDEX, TROPONINI in the last 168 hours.  HbA1C: Hgb A1c MFr Bld  Date/Time Value Ref Range Status  12/20/2020 01:17 PM 7.2 (H) 4.8 - 5.6 % Final    Comment:    (NOTE) Pre diabetes:           5.7%-6.4%  Diabetes:              >6.4%  Glycemic control for   <7.0% adults with diabetes     CBG: Recent Labs  Lab 01/07/21 0237  GLUCAP 260*    Review of Systems:   Unable to obtain due to patient condition  Past Medical History:  She,  has a past medical history of Acute on chronic respiratory failure with hypoxia ([place]), ARDS (adult respiratory distress syndrome) ([place]), Chronic kidney disease, stage II (mild), MRSA (methicillin resistant staphylococcus aureus) pneumonia ([place]), and Obstructive chronic bronchitis without exacerbation ([place]).   Surgical History:   Past Surgical History:  Procedure Laterality Date  . TRACHEOSTOMY         Family History:  Her family history includes CAD in her mother.   Allergies No Known Allergies   Home Medications  Prior to Admission medications   Medication Sig Start Date End Date Taking? Authorizing Provider  allopurinol (ZYLOPRIM) 100 MG tablet Take 200 mg by mouth daily.    [provider]  amiodarone (PACERONE) 200 MG tablet Take 200 mg by mouth daily. 10/25/20   [provider]  atorvastatin (LIPITOR) 20 MG tablet Take 20 mg by mouth at bedtime. 11/26/20   [provider]  carbidopa-levodopa (SINEMET IR) 25-100 MG tablet Take 1 tablet by mouth every 6 (six) hours. 10/28/20   [provider]  chlorhexidine (PERIDEX) 0.12 % solution Use as directed 15 mLs in the mouth or throat 2 (two) times daily.    [provider]  cholecalciferol (VITAMIN D3) 25 MCG (1000 UNIT) tablet Take 1,000 Units by mouth daily.    [provider]  divalproex (DEPAKOTE SPRINKLE) 125 MG capsule Take 500 mg by mouth 2 (two) times daily. 11/24/20   [provider]  ELIQUIS 5 MG TABS tablet Take 1 tablet by mouth 2 (two) times daily. 12/02/20   [provider]  FLUoxetine (PROZAC) 10 MG tablet Take 10 mg by mouth daily. 11/08/20   [provider]  furosemide (LASIX) 20 MG tablet Take  60 mg by mouth every 12 (twelve) hours. 12/02/20   [provider]  insulin glargine (LANTUS) 100 UNIT/ML injection Inject 34 Units into the skin at bedtime.    [provider]  insulin lispro (HUMALOG) 100 UNIT/ML injection Inject into the skin 4 (four) times daily -  before meals and at bedtime.    [provider]  ipratropium-albuterol (DUONEB) 0.5-2.5 (3) MG/3ML SOLN Take 3 mLs by nebulization every 4 (four) hours as needed (shortness of breath).    [provider]  magnesium oxide (MAG-OX) 400 MG tablet Take 400 mg by mouth 2 (two) times daily.    [provider]  melatonin 3 MG TABS tablet Take 6 mg by mouth at bedtime.    [provider]  memantine (NAMENDA) 5 MG tablet Take 5 mg by mouth 2 (two) times daily. 11/26/20   [provider]  metoprolol tartrate (LOPRESSOR) 25 MG tablet Take 25 mg by mouth 2 (two) times daily.    [provider]  miconazole (MICOTIN) 2 % powder Apply 1 application topically every 12 (twelve) hours.    [provider]  OLANZapine (ZYPREXA) 10 MG tablet Take 10 mg by mouth at bedtime. 11/16/20   [provider]  ondansetron (ZOFRAN-ODT) 4 MG disintegrating tablet Take 4 mg by mouth every 8 (eight) hours as needed for nausea/vomiting. 08/16/20   [provider]  pantoprazole (PROTONIX) 40 MG tablet Take 40 mg by mouth daily.    [provider]  polyethylene glycol (MIRALAX / GLYCOLAX) 17 g packet Take 17 g by mouth daily.    [provider]  potassium chloride (KLOR-CON) 20 MEQ packet Take 40 mEq by mouth daily.    [provider]  pramipexole (MIRAPEX) 0.125 MG tablet Take 0.125 mg by mouth every 8 (eight) hours. 11/05/20   [provider]  propylthiouracil (PTU) 50 MG tablet Take 50 mg by mouth every 12 (twelve) hours. 10/19/20   [provider]  senna-docusate (SENOKOT-S) 8.6-50 MG tablet Take 2 tablets by mouth at bedtime.     [provider]     Critical care time:

## 2021-01-07 NOTE — Progress Notes (Signed)
eLink Physician-Brief Progress Note Patient Name: Lindsey Collins DOB: [birth year] MRN: [id number]   Date of Service  01/07/2021  HPI/Events of Note  Patient admitted to the hospital from SNF with altered mental status, acute on chronic hypercapnic respiratory failure, septic shock seondary to pneumonia.  eICU Interventions  New Patient Evaluation completed.        Kerry Kass Erek Kowal 01/07/2021, 4:51 AM

## 2021-01-07 NOTE — Progress Notes (Signed)
Pharmacy Antibiotic Note  Lindsey Collins is a 75 y.o. female admitted on 01/07/2021 with altered mental status.  Pharmacy has been consulted for Vancomycin/Cefepime dosing. WBC is elevated. CrCl ~50. From [facility], chronic respiratory failure/trach.   Plan: Vancomycin 750 mg IV q24h >>Estimated AUC: 442 Cefepime 2g IV q12h Trend WBC, temp, renal function  F/U infectious work-up Drug levels as indicated   Height: 5\' 4"  (162.6 cm) Weight: 92.4 kg (203 lb 11.3 oz) IBW/kg (Calculated) : 54.7  Temp (24hrs), Avg:101 F (38.3 C), Min:101 F (38.3 C), Max:101 F (38.3 C)  Recent Labs  Lab 01/07/21 0306  WBC 23.2*  CREATININE 1.09*  LATICACIDVEN 2.6*    Estimated Creatinine Clearance: 49.9 mL/min (A) (by C-G formula based on SCr of 1.09 mg/dL (H)).    No Known Allergies  Narda Bonds, PharmD, BCPS Clinical Pharmacist Phone: [phone]

## 2021-01-07 NOTE — ED Provider Notes (Signed)
[facility] EMERGENCY DEPARTMENT Provider Note   CSN: [id number] Arrival date & time: 01/07/21  0150     History Chief Complaint - altered mental status   Level 5 caveat due to acuity of condition  Lindsey Collins is a 75 y.o. female.  The history is provided by the EMS personnel.  Altered Mental Status Presenting symptoms: unresponsiveness   Severity:  Severe Most recent episode:  Today Episode history:  Single Timing:  Constant Progression:  Worsening Chronicity:  New Context: nursing home resident   Patient with history of chronic respiratory failure with tracheostomy in place, chronic kidney disease, Parkinson disease presents with altered mental status.  Patient presents from skilled nursing facility at [facility].  It is reported the patient was unresponsive with elevated blood pressure and heart rate. Patient is unresponsive and unable to provide any history     Past Medical History:  Diagnosis Date  . Acute on chronic respiratory failure with hypoxia ([place])   . ARDS (adult respiratory distress syndrome) ([place])   . Chronic kidney disease, stage II (mild)   . MRSA (methicillin resistant staphylococcus aureus) pneumonia ([place])   . Obstructive chronic bronchitis without exacerbation [facility])     Patient Active Problem List   Diagnosis Date Noted  . Elevated troponin 12/20/2020  . Chronic respiratory failure with hypoxia ([place]) 12/20/2020  . Status post tracheostomy ([place]) 12/20/2020  . Normocytic anemia 12/20/2020  . Parkinson's disease ([place]) 12/20/2020  . Diabetes mellitus type 2, uncontrolled ([place]) 12/20/2020  . Acute on chronic respiratory failure with hypoxia ([place])   . MRSA (methicillin resistant staphylococcus aureus) pneumonia ([place])   . MRSA (methicillin resistant staphylococcus aureus) pneumonia ([place])   . ARDS (adult respiratory distress syndrome) ([place])   . Obstructive chronic bronchitis without exacerbation ([place])   . Chronic kidney  disease, stage II (mild)     Past Surgical History:  Procedure Laterality Date  . TRACHEOSTOMY       OB History   No obstetric history on file.     Family History  Problem Relation Age of Onset  . CAD Mother        hx CABG       Home Medications Prior to Admission medications   Medication Sig Start Date End Date Taking? Authorizing Provider  allopurinol (ZYLOPRIM) 100 MG tablet Take 200 mg by mouth daily.    [provider]  amiodarone (PACERONE) 200 MG tablet Take 200 mg by mouth daily. 10/25/20   [provider]  atorvastatin (LIPITOR) 20 MG tablet Take 20 mg by mouth at bedtime. 11/26/20   [provider]  carbidopa-levodopa (SINEMET IR) 25-100 MG tablet Take 1 tablet by mouth every 6 (six) hours. 10/28/20   [provider]  chlorhexidine (PERIDEX) 0.12 % solution Use as directed 15 mLs in the mouth or throat 2 (two) times daily.    [provider]  cholecalciferol (VITAMIN D3) 25 MCG (1000 UNIT) tablet Take 1,000 Units by mouth daily.    [provider]  divalproex (DEPAKOTE SPRINKLE) 125 MG capsule Take 500 mg by mouth 2 (two) times daily. 11/24/20   [provider]  ELIQUIS 5 MG TABS tablet Take 1 tablet by mouth 2 (two) times daily. 12/02/20   [provider]  FLUoxetine (PROZAC) 10 MG tablet Take 10 mg by mouth daily. 11/08/20   [provider]  furosemide (LASIX) 20 MG tablet Take 60 mg by mouth every 12 (twelve) hours. 12/02/20   [provider]  insulin glargine (LANTUS) 100 UNIT/ML injection Inject 34 Units into the skin at bedtime.    [provider]  insulin lispro (HUMALOG) 100 UNIT/ML injection Inject into the skin 4 (four) times daily -  before meals and at bedtime.    [provider]  ipratropium-albuterol (DUONEB) 0.5-2.5 (3) MG/3ML SOLN Take 3 mLs by nebulization every 4 (four) hours as needed (shortness of breath).    [provider]  magnesium oxide  (MAG-OX) 400 MG tablet Take 400 mg by mouth 2 (two) times daily.    [provider]  melatonin 3 MG TABS tablet Take 6 mg by mouth at bedtime.    [provider]  memantine (NAMENDA) 5 MG tablet Take 5 mg by mouth 2 (two) times daily. 11/26/20   [provider]  metoprolol tartrate (LOPRESSOR) 25 MG tablet Take 25 mg by mouth 2 (two) times daily.    [provider]  miconazole (MICOTIN) 2 % powder Apply 1 application topically every 12 (twelve) hours.    [provider]  OLANZapine (ZYPREXA) 10 MG tablet Take 10 mg by mouth at bedtime. 11/16/20   [provider]  ondansetron (ZOFRAN-ODT) 4 MG disintegrating tablet Take 4 mg by mouth every 8 (eight) hours as needed for nausea/vomiting. 08/16/20   [provider]  pantoprazole (PROTONIX) 40 MG tablet Take 40 mg by mouth daily.    [provider]  polyethylene glycol (MIRALAX / GLYCOLAX) 17 g packet Take 17 g by mouth daily.    [provider]  potassium chloride (KLOR-CON) 20 MEQ packet Take 40 mEq by mouth daily.    [provider]  pramipexole (MIRAPEX) 0.125 MG tablet Take 0.125 mg by mouth every 8 (eight) hours. 11/05/20   [provider]  propylthiouracil (PTU) 50 MG tablet Take 50 mg by mouth every 12 (twelve) hours. 10/19/20   [provider]  senna-docusate (SENOKOT-S) 8.6-50 MG tablet Take 2 tablets by mouth at bedtime.    [provider]    Allergies    Patient has no known allergies.  Review of Systems   Review of Systems  Unable to perform ROS: Acuity of condition    Physical Exam Updated Vital Signs BP (!) 125/58   Pulse (!) 107   Temp (!) 101 F (38.3 C)   Resp 18   Ht 1.651 m (5\' 5" )   SpO2 100%   Physical Exam  CONSTITUTIONAL: Ill-appearing, unresponsive HEAD: Normocephalic/atraumatic EYES: Pupils pinpoint and equal ENMT: Mucous membranes moist NECK: Tracheostomy in place, no bleeding or discharge CV:  S1/S2 noted, tachycardic LUNGS: Coarse breath sounds noted bilaterally ABDOMEN: soft, nontender, G-tube in place with dark material at the stoma GU: Patient wearing a diaper NEURO: Pt is unresponsive, she will arouse to painful stimuli EXTREMITIES: pulses normal/equal, full ROM SKIN: warm, color normal, no sacral wounds noted PSYCH: Unable to assess  ED Results / Procedures / Treatments   Labs (all labs ordered are listed, but only abnormal results are displayed) Labs Reviewed  CBG MONITORING, ED - Abnormal; Notable for the following components:      Result Value   Glucose-Capillary 260 (*)    All other components within normal limits  I-STAT ARTERIAL BLOOD GAS, ED - Abnormal; Notable for the following components:   pCO2 arterial 67.2 (*)    pO2, Arterial 65 (*)    Bicarbonate 38.7 (*)    TCO2 41 (*)    Acid-Base Excess 11.0 (*)    Calcium,  Ion 1.13 (*)    HCT 35.0 (*)    Hemoglobin 11.9 (*)    All other components within normal limits  URINE CULTURE  CULTURE, BLOOD (ROUTINE X 2)  CULTURE, BLOOD (ROUTINE X 2)  RESP PANEL BY RT-PCR (FLU A&B, COVID) ARPGX2  VALPROIC ACID LEVEL  BLOOD GAS, ARTERIAL  LACTIC ACID, PLASMA  LACTIC ACID, PLASMA  COMPREHENSIVE METABOLIC PANEL  CBC WITH DIFFERENTIAL/PLATELET  PROTIME-INR  APTT  URINALYSIS, ROUTINE W REFLEX MICROSCOPIC  BRAIN NATRIURETIC PEPTIDE  BLOOD GAS, ARTERIAL  CBC  CREATININE, SERUM    EKG EKG Interpretation  Date/Time:  Tuesday January 07 2021 01:54:46 EDT Ventricular Rate:  120 PR Interval:  159 QRS Duration: 80 QT Interval:  336 QTC Calculation: 475 R Axis:   87 Text Interpretation: Sinus tachycardia Borderline right axis deviation Low voltage, precordial leads Nonspecific repol abnormality, diffuse leads Confirmed by Ripley Fraise [id number]) on 01/07/2021 1:57:23 AM   Radiology CT Head Wo Contrast  Result Date: 01/07/2021 CLINICAL DATA:  Delirium EXAM: CT HEAD WITHOUT CONTRAST TECHNIQUE: Contiguous axial  images were obtained from the base of the skull through the vertex without intravenous contrast. COMPARISON:  None. FINDINGS: Brain: Normal anatomic configuration. Parenchymal volume loss is commensurate with the patient's age. Mild periventricular white matter changes are present likely reflecting the sequela of small vessel ischemia. Remote lacunar infarct is noted within the left subinsular white matter. No abnormal intra or extra-axial mass lesion or fluid collection. No abnormal mass effect or midline shift. No evidence of acute intracranial hemorrhage or infarct. Ventricular size is normal. Cerebellum unremarkable. Vascular: No asymmetric hyperdense vasculature at the skull base. Skull: Intact Sinuses/Orbits: Paranasal sinuses are clear. Orbits are unremarkable. Other: Mastoid air cells and middle ear cavities are clear. IMPRESSION: No acute intracranial abnormality. Mild senescent change.  Remote left subinsular lacunar infarct. Electronically Signed   By: Fidela Salisbury MD   On: 01/07/2021 02:45   DG Chest Port 1 View  Result Date: 01/07/2021 CLINICAL DATA:  Questionable sepsis.  Unresponsive patient. EXAM: PORTABLE CHEST 1 VIEW COMPARISON:  12/21/2020 FINDINGS: Tracheostomy tube. Mild cardiac enlargement. Right perihilar opacity likely representing pneumonia but follow-up to resolution recommended to exclude underlying mass lesion. Air bronchograms behind the left heart suggesting consolidation or atelectasis. Probable small left pleural effusion. Calcification of the aorta. IMPRESSION: Right perihilar opacity likely representing pneumonia but follow-up to resolution recommended to exclude underlying mass lesion. Consolidation or atelectasis in the left lung base with small left pleural effusion. Electronically Signed   By: Lucienne Capers M.D.   On: 01/07/2021 02:14    Procedures .Critical Care Performed by: Ripley Fraise, MD Authorized by: Ripley Fraise, MD   Critical care provider  statement:    Critical care time (minutes):  64   Critical care start time:  01/07/2021 2:01 AM   Critical care end time:  01/07/2021 3:05 AM   Critical care time was exclusive of:  Separately billable procedures and treating other patients   Critical care was necessary to treat or prevent imminent or life-threatening deterioration of the following conditions:  Respiratory failure, sepsis and CNS failure or compromise   Critical care was time spent personally by me on the following activities:  Development of treatment plan with patient or surrogate, discussions with consultants, evaluation of patient's response to treatment, examination of patient, re-evaluation of patient's condition, pulse oximetry, ordering and review of laboratory studies, ordering and review of radiographic studies, review of old charts, ordering and performing treatments and interventions and  ventilator management   I assumed direction of critical care for this patient from another provider in my specialty: no     Care discussed with: admitting provider       Medications Ordered in ED Medications  acetaminophen (TYLENOL) suppository 650 mg (has no administration in time range)  lactated ringers infusion (has no administration in time range)  lactated ringers bolus 1,000 mL (has no administration in time range)    And  lactated ringers bolus 1,000 mL (has no administration in time range)    And  lactated ringers bolus 1,000 mL (has no administration in time range)    And  lactated ringers bolus 500 mL (has no administration in time range)  vancomycin (VANCOREADY) IVPB 1500 mg/300 mL (has no administration in time range)  ceFEPIme (MAXIPIME) 2 g in sodium chloride 0.9 % 100 mL IVPB (has no administration in time range)  azithromycin (ZITHROMAX) 500 mg in sodium chloride 0.9 % 250 mL IVPB (has no administration in time range)  docusate sodium (COLACE) capsule 100 mg (has no administration in time range)  polyethylene  glycol (MIRALAX / GLYCOLAX) packet 17 g (has no administration in time range)  ceFEPIme (MAXIPIME) 2 g in sodium chloride 0.9 % 100 mL IVPB (has no administration in time range)  apixaban (ELIQUIS) tablet 5 mg (has no administration in time range)  lactated ringers infusion (has no administration in time range)  insulin glargine (LANTUS) injection 36 Units (has no administration in time range)  insulin aspart (novoLOG) injection 0-15 Units (has no administration in time range)    ED Course  I have reviewed the triage vital signs and the nursing notes.  Pertinent labs & imaging results that were available during my care of the patient were reviewed by me and considered in my medical decision making (see chart for details).    MDM Rules/Calculators/A&P                          2:01 AM Patient presents from [facility] for unresponsiveness.  Paperwork that accompanied the patient revealed arterial blood gas at 12:49 AM revealed pH of 7.1 and a CO2 of 151.  This could be contributing to her altered mental status. Patient also noted to be febrile.  Labs and imaging are pending at this time.  Patient is critically ill 3:06 AM Patient febrile and tachycardic.  X-ray is consistent with pneumonia.  Code sepsis has been called.  Discussed ventilator management settings with respiratory therapy IV fluids and IV antibiotics have been ordered for pneumonia. CT head negative for intracranial hemorrhage.  Patient's mental status is unchanged, but vital signs are improving. Discussed the case with critical care who will admit the patient.  Second IV has been established by IV team, lactic acid is pending at this time Final Clinical Impression(s) / ED Diagnoses Final diagnoses:  Community acquired pneumonia of right lung, unspecified part of lung  Acute on chronic respiratory failure with hypoxia and hypercapnia ([place])    Rx / DC Orders ED Discharge Orders    None       Ripley Fraise,  MD 01/07/21 [phone]

## 2021-01-07 NOTE — Progress Notes (Addendum)
Inpatient Diabetes Program Recommendations  AACE/ADA: New Consensus Statement on Inpatient Glycemic Control (2015)  Target Ranges:  Prepandial:   less than 140 mg/dL      Peak postprandial:   less than 180 mg/dL (1-2 hours)      Critically ill patients:  140 - 180 mg/dL   Lab Results  Component Value Date   GLUCAP 250 (H) 01/07/2021   HGBA1C 7.2 (H) 12/20/2020    Review of Glycemic Control Results for Lindsey Collins, Lindsey Collins (MRN [id number]) as of 01/07/2021 10:40  Ref. Range 12/21/2020 07:16 12/21/2020 11:02 12/21/2020 16:49 01/07/2021 02:37 01/07/2021 04:16 01/07/2021 07:56  Glucose-Capillary Latest Ref Range: 70 - 99 mg/dL 252 (H) 257 (H) 256 (H) 260 (H) 311 (H) 250 (H)   Diabetes history: DM Outpatient Diabetes medications: Lantus 34 units qd + Humalog ? Correction q 4 hrs Current orders for Inpatient glycemic control: Novolog 0-15 units q 4 hrs  Inpatient Diabetes Program Recommendations:   -Add portion of basal Consider Lantus 10 units and adjust as needed  Thank you, Bethena Roys E. Arjun Hard, RN, MSN, CDE  Diabetes Coordinator Inpatient Glycemic Control Team Team Pager [phone] (8am-5pm) 01/07/2021 10:45 AM

## 2021-01-07 NOTE — Progress Notes (Signed)
Patient transported from ED Room 30 to 2Z74 with no complications noted.

## 2021-01-07 NOTE — Progress Notes (Signed)
Notified bedside nurse of need to draw repeat lactic acid. 

## 2021-01-07 NOTE — Progress Notes (Signed)
Initial Nutrition Assessment  DOCUMENTATION CODES:   Obesity unspecified  INTERVENTION:   When able, recommend initiation of enteral nutrition via PEG: - Vital AF 1.2 @ 60 ml/hr (1440 ml/day)  Recommended tube feeding regimen would provide 1728 kcal, 108 grams of protein, and 1168 ml of H2O.   NUTRITION DIAGNOSIS:   Inadequate oral intake related to inability to eat as evidenced by NPO status.  GOAL:   Patient will meet greater than or equal to 90% of their needs  MONITOR:   Labs,Weight trends,I & O's  REASON FOR ASSESSMENT:   Ventilator,Consult Enteral/tube feeding initiation and management  ASSESSMENT:   75 year old female who presented to the ED from [facility] on 4/26 with AMS. PMH of chronic respiratory failure s/p trach, CHF, T2DM, Parkinson's, bipolar disorder. Pt admitted with sepsis secondary to HCAP.   Per CCM, pt is a chronic trach/vent. Plan is to hold TF today as pt with dark, coffee-ground contents in PEG tube. RD will leave TF recommendations.  No weight history available in chart. Pt with mild pitting edema to BUE and moderate pitting edema to BLE. Difficult to determine dry weight at this time.  Patient is on chronic vent support via trach MV: 9.9 L/min Temp (24hrs), Avg:99.6 F (37.6 C), Min:98.3 F (36.8 C), Max:101 F (38.3 C)  Drips: LR: 125 ml/hr  Medications reviewed and include: SSI q 4 hours, IV protonix, lokelma 10 grams once, IV abx  Labs reviewed: creatinine 1.09, lactic acid 5.2 CBG's: 671-245  I/O's: +3.9 L since admit  NUTRITION - FOCUSED PHYSICAL EXAM:  Unable to complete at this time.  Diet Order:   Diet Order            Diet NPO time specified  Diet effective now                 EDUCATION NEEDS:   No education needs have been identified at this time  Skin:  Skin Assessment: Reviewed RN Assessment  Last BM:  01/07/21 small type 5  Height:   Ht Readings from Last 1 Encounters:  01/07/21 5\' 4"  (1.626  m)    Weight:   Wt Readings from Last 1 Encounters:  01/07/21 92.4 kg    Ideal Body Weight:  54.5 kg  BMI:  Body mass index is 34.97 kg/m.  Estimated Nutritional Needs:   Kcal:  1600-1800  Protein:  105-120 grams  Fluid:  1.6-1.8 L    Gustavus Bryant, MS, RD, LDN Inpatient Clinical Dietitian Please see AMiON for contact information.

## 2021-01-07 NOTE — TOC Initial Note (Addendum)
Transition of Care [facility]) - Initial/Assessment Note    Patient Details  Name: Lindsey Collins MRN: [id number] Date of Birth: May 16, 1946  [facility]) CM/SW Contact:    Bartholomew Crews, RN Phone Number: [phone] 01/07/2021, 3:38 PM  Clinical Narrative:                  Patient from [facility] ([facility] where she resides for long term care. Left voicemail for Admissions [phone].   Spoke with patient at the bedside who was able to nod to confirm plan for transition back to [facility] when medically stable. Advised patient that CM would call her daughter.   Unable to speak with her daughter, Baxter Flattery, or leave voicemail d/t no answer and voicemail full.   1605: Received call back from Prem in Admissions at [facility]. Patient transitioned from [facility] to [facility] on 4/20 and was not on the vent. Patient can transition back to [facility] on vent, but cannot be on any IV drips. When medically ready will need DC summary and Covid test within 48 hours of discharge.   Expected Discharge Plan: Long Term Nursing Home Barriers to Discharge: Continued Medical Work up   Patient Goals and CMS Choice        Expected Discharge Plan and Services Expected Discharge Plan: [place]                                              Prior Living Arrangements/Services                       Activities of Daily Living      Permission Sought/Granted                  Emotional Assessment              Admission diagnosis:  Acute respiratory failure with hypercapnia (HCC) [J96.02] Acute on chronic respiratory failure with hypoxia and hypercapnia ([place]) [J96.21, J96.22] Community acquired pneumonia of right lung, unspecified part of lung [J18.9] Patient Active Problem List   Diagnosis Date Noted  . Acute respiratory failure with hypercapnia ([place]) 01/07/2021  . Community acquired pneumonia of right lung   . Elevated troponin 12/20/2020  . Chronic  respiratory failure with hypoxia ([place]) 12/20/2020  . Status post tracheostomy ([place]) 12/20/2020  . Normocytic anemia 12/20/2020  . Parkinson's disease ([place]) 12/20/2020  . Diabetes mellitus type 2, uncontrolled ([place]) 12/20/2020  . Acute on chronic respiratory failure with hypoxia and hypercapnia (HCC)   . MRSA (methicillin resistant staphylococcus aureus) pneumonia ([place])   . MRSA (methicillin resistant staphylococcus aureus) pneumonia ([place])   . ARDS (adult respiratory distress syndrome) ([place])   . Obstructive chronic bronchitis without exacerbation ([place])   . Chronic kidney disease, stage II (mild)    PCP:  [name], MD Pharmacy:  No Pharmacies Listed    Social Determinants of Health (SDOH) Interventions    Readmission Risk Interventions No flowsheet data found.

## 2021-01-07 NOTE — Progress Notes (Signed)
NAME:  Lindsey Collins, MRN:  [id number], DOB:  1946-04-17, LOS: 0 ADMISSION DATE:  01/07/2021, CONSULTATION DATE: January 07, 2021 REFERRING MD: [facility] emergency room here, CHIEF COMPLAINT: Fever shortness of breath altered mental status  History of Present Illness:  Patient is a 75 years old Caucasian female morbidly obese bees has been on trach for the past 2 years after MRSA pneumonia she is at [facility] for rehab she has chronic respiratory failure now trach dependent diastolic CHF chronic type 2 diabetes Parkinson disease bipolar disorder she was found to have acute altered mental status with respiratory distress fever was sent to the ED temperature was found to be 101 she was brought back to the ventilator through her trach we have Reviewed that she has right upper lobe pneumonia so she is admitted to the ICU last month March end of March patient was found to have Serratia pneumonia she was treated with cefepime she had an episode of tachycardia at that time which was most likely related to sepsis  Pertinent  Medical History  Tracheostomy.  Obesity.  Type 2 diabetes.  Parkinson.  Bipolar.  Chronic trach.  Chronic respiratory failure  INTERVAL EVENTS:  4/26: admitted this am. Chart review and exam.  Objective   Blood pressure (!) 147/69, pulse 83, temperature 99.5 F (37.5 C), temperature source Axillary, resp. rate (!) 24, height 5\' 4"  (1.626 m), weight 92.4 kg, SpO2 100 %.    Vent Mode: PRVC FiO2 (%):  [50 %-60 %] 60 % Set Rate:  [18 bmp-24 bmp] 24 bmp Vt Set:  [450 mL] 450 mL PEEP:  [5 cmH20] 5 cmH20 Plateau Pressure:  [28 cmH20-30 cmH20] 30 cmH20   Intake/Output Summary (Last 24 hours) at 01/07/2021 0757 Last data filed at 01/07/2021 0600 Gross per 24 hour  Intake 3905.42 ml  Output 0 ml  Net 3905.42 ml   Filed Weights   01/07/21 0500  Weight: 92.4 kg    Examination: General:   Well-nourished no acute distress on trach on vent HEENT: normal, eomi but keeps  eyes closed, mmmp Neck:  Trach site seems to be cleared Vascular: No carotid bruits; 4/4 extremity pulses 2+   Cardiac:  rrr, distant Lungs:   diffuse rhonchi and wheezing bilaterally Abd: obese, peg tube in place with coffee ground contents within, soft, nontender, no hepatomegaly  Ext: no edema Musculoskeletal:  No deformities, not following commands so unable to determine strength  Skin: warm and dry  Neuro: arousable and seemingly nods/shakes head to questions but not following and mobility commands.    Assessment & Plan:   Acute on top of CHRONIC hypoxic/hypercarbic respiratory failure: -chronic trach vent (although there is some mention about PMV use, unsure how recent this was) -f/u resp cx -anticipate transfer back to [facility] when stabilized -rvp negative  History of Serratia pneumonia:  -she was treated with cefepime -placed back on this -f/u cx Sepsis 2/2 hcap:  -wbc 23K  Chronic macrocytic anemia:  -does have dark, coffee ground contents in peg tube. -will send for hemoccult and start BID PPI -holding eliquis but will cont chemoprophy -no acute indication for transfusion -follow trend ?GIB: -hemoccult being processed -bid ppi -holding eliquis -holding tf  Lactic acidosis:  -follow trend  Dm2 with hyperglycemia:  -ssi -a1c 7.2 -hold lantus for now in light of need to hold tf  H/o parkinson's disease Acute metabolic encephhlopathy:  -cth negative for acute process   HFpEF:  - per chart -no echo on file  H/o arrhythmia (unspecifed) -however is on eliquis.  -monitoring on tele  Labs   CBC: Recent Labs  Lab 01/07/21 0156 01/07/21 0306 01/07/21 0326  WBC  --  23.2*  --   NEUTROABS  --  20.6*  --   HGB 11.9* 11.2* 11.6*  HCT 35.0* 37.5 34.0*  MCV  --  101.1*  --   PLT  --  264  --     Basic Metabolic Panel: Recent Labs  Lab 01/07/21 0156 01/07/21 0306 01/07/21 0326  NA 136 137 138  K 4.4 5.6* 4.6  CL  --  90*  --   CO2  --  39*  --    GLUCOSE  --  290*  --   BUN  --  14  --   CREATININE  --  1.09*  --   CALCIUM  --  8.6*  --    GFR: Estimated Creatinine Clearance: 49.9 mL/min (A) (by C-G formula based on SCr of 1.09 mg/dL (H)). Recent Labs  Lab 01/07/21 0306  WBC 23.2*  LATICACIDVEN 2.6*    Liver Function Tests: Recent Labs  Lab 01/07/21 0306  AST 24  ALT 9  ALKPHOS 60  BILITOT 0.8  PROT 6.6  ALBUMIN 2.9*   No results for input(s): LIPASE, AMYLASE in the last 168 hours. No results for input(s): AMMONIA in the last 168 hours.  ABG    Component Value Date/Time   PHART 7.324 (L) 01/07/2021 0326   PCO2ART 74.2 (HH) 01/07/2021 0326   PO2ART 69 (L) 01/07/2021 0326   HCO3 38.1 (H) 01/07/2021 0326   TCO2 40 (H) 01/07/2021 0326   O2SAT 89.0 01/07/2021 0326     Coagulation Profile: Recent Labs  Lab 01/07/21 0306  INR 1.2    Cardiac Enzymes: No results for input(s): CKTOTAL, CKMB, CKMBINDEX, TROPONINI in the last 168 hours.  HbA1C: Hgb A1c MFr Bld  Date/Time Value Ref Range Status  12/20/2020 01:17 PM 7.2 (H) 4.8 - 5.6 % Final    Comment:    (NOTE) Pre diabetes:          5.7%-6.4%  Diabetes:              >6.4%  Glycemic control for   <7.0% adults with diabetes     CBG: Recent Labs  Lab 01/07/21 0237  GLUCAP 260*   Critical care time: The patient is critically ill with multiple organ systems failure and requires high complexity decision making for assessment and support, frequent evaluation and titration of therapies, application of advanced monitoring technologies and extensive interpretation of multiple databases.  Additional Critical care time 38 mins. This represents my time independent of the NPs time taking care of the pt. This is excluding procedures.    Audria Nine DO [facility] 01/07/2021, 7:57 AM See Amion for pager If no response to pager, please call [phone] until 1900 After 1900 please call [facility] [phone]

## 2021-01-07 NOTE — Progress Notes (Signed)
Patient transported from ED Room 30 to CT and back with no complications.

## 2021-01-08 ENCOUNTER — Other Ambulatory Visit: Payer: Self-pay

## 2021-01-08 ENCOUNTER — Encounter (HOSPITAL_COMMUNITY): Payer: Self-pay | Admitting: Internal Medicine

## 2021-01-08 DIAGNOSIS — J189 Pneumonia, unspecified organism: Secondary | ICD-10-CM | POA: Diagnosis not present

## 2021-01-08 LAB — MAGNESIUM
Magnesium: 1.4 mg/dL — ABNORMAL LOW (ref 1.7–2.4)
Magnesium: 1.9 mg/dL (ref 1.7–2.4)
Magnesium: 2.3 mg/dL (ref 1.7–2.4)

## 2021-01-08 LAB — BASIC METABOLIC PANEL
Anion gap: 7 (ref 5–15)
BUN: 12 mg/dL (ref 8–23)
CO2: 32 mmol/L (ref 22–32)
Calcium: 8.3 mg/dL — ABNORMAL LOW (ref 8.9–10.3)
Chloride: 101 mmol/L (ref 98–111)
Creatinine, Ser: 0.73 mg/dL (ref 0.44–1.00)
GFR, Estimated: >60 mL/min (ref >60)
Glucose, Bld: 127 mg/dL — ABNORMAL HIGH (ref 70–99)
Potassium: 3.4 mmol/L — ABNORMAL LOW (ref 3.5–5.1)
Sodium: 140 mmol/L (ref 135–145)

## 2021-01-08 LAB — GLUCOSE, CAPILLARY
Glucose-Capillary: 128 mg/dL — ABNORMAL HIGH (ref 70–99)
Glucose-Capillary: 131 mg/dL — ABNORMAL HIGH (ref 70–99)
Glucose-Capillary: 152 mg/dL — ABNORMAL HIGH (ref 70–99)
Glucose-Capillary: 158 mg/dL — ABNORMAL HIGH (ref 70–99)
Glucose-Capillary: 165 mg/dL — ABNORMAL HIGH (ref 70–99)
Glucose-Capillary: 186 mg/dL — ABNORMAL HIGH (ref 70–99)

## 2021-01-08 LAB — PHOSPHORUS
Phosphorus: 1.7 mg/dL — ABNORMAL LOW (ref 2.5–4.6)
Phosphorus: 3.4 mg/dL (ref 2.5–4.6)
Phosphorus: 3.6 mg/dL (ref 2.5–4.6)

## 2021-01-08 LAB — CBC
HCT: 26.5 % — ABNORMAL LOW (ref 36.0–46.0)
Hemoglobin: 8.1 g/dL — ABNORMAL LOW (ref 12.0–15.0)
MCH: 29.5 pg (ref 26.0–34.0)
MCHC: 30.6 g/dL (ref 30.0–36.0)
MCV: 96.4 fL (ref 80.0–100.0)
Platelets: 195 10*3/uL (ref 150–400)
RBC: 2.75 MIL/uL — ABNORMAL LOW (ref 3.87–5.11)
RDW: 16 % — ABNORMAL HIGH (ref 11.5–15.5)
WBC: 10.1 10*3/uL (ref 4.0–10.5)
nRBC: 0 % (ref 0.0–0.2)

## 2021-01-08 MED ORDER — PANTOPRAZOLE SODIUM 40 MG PO PACK
40.0000 mg | PACK | Freq: Every day | ORAL | Status: DC
  Administered 2021-01-08 – 2021-01-09 (×2): 40 mg
  Filled 2021-01-08 (×2): qty 20

## 2021-01-08 MED ORDER — MAGNESIUM SULFATE 2 GM/50ML IV SOLN
2.0000 g | Freq: Once | INTRAVENOUS | Status: AC
  Administered 2021-01-08: 2 g via INTRAVENOUS
  Filled 2021-01-08: qty 50

## 2021-01-08 MED ORDER — DOCUSATE SODIUM 50 MG/5ML PO LIQD
100.0000 mg | Freq: Two times a day (BID) | ORAL | Status: DC
  Administered 2021-01-08: 100 mg
  Filled 2021-01-08 (×2): qty 10

## 2021-01-08 MED ORDER — CARBIDOPA-LEVODOPA 25-100 MG PO TABS
1.0000 | ORAL_TABLET | Freq: Four times a day (QID) | ORAL | Status: DC
  Administered 2021-01-08 – 2021-01-09 (×5): 1
  Filled 2021-01-08 (×7): qty 1

## 2021-01-08 MED ORDER — DIVALPROEX SODIUM 125 MG PO CSDR: 500.0000 mg | DELAYED_RELEASE_CAPSULE | Freq: Two times a day (BID) | ORAL | Status: DC

## 2021-01-08 MED ORDER — VANCOMYCIN HCL 1250 MG/250ML IV SOLN
1250.0000 mg | INTRAVENOUS | Status: DC
  Administered 2021-01-08 – 2021-01-09 (×2): 1250 mg via INTRAVENOUS
  Filled 2021-01-08 (×2): qty 250

## 2021-01-08 MED ORDER — SENNA 8.6 MG PO TABS
1.0000 | ORAL_TABLET | Freq: Every day | ORAL | Status: DC
Start: 2021-01-09 — End: 2021-01-09

## 2021-01-08 MED ORDER — PANTOPRAZOLE SODIUM 40 MG PO TBEC: 40.0000 mg | DELAYED_RELEASE_TABLET | Freq: Every day | ORAL | Status: DC

## 2021-01-08 MED ORDER — CEFEPIME HCL 2 G IJ SOLR
2.0000 g | Freq: Three times a day (TID) | INTRAMUSCULAR | Status: DC
  Administered 2021-01-08 – 2021-01-09 (×3): 2 g via INTRAVENOUS
  Filled 2021-01-08 (×3): qty 2

## 2021-01-08 MED ORDER — PRAMIPEXOLE DIHYDROCHLORIDE 0.125 MG PO TABS
0.1250 mg | ORAL_TABLET | Freq: Three times a day (TID) | ORAL | Status: DC
  Administered 2021-01-08 – 2021-01-09 (×4): 0.125 mg
  Filled 2021-01-08 (×5): qty 1

## 2021-01-08 MED ORDER — APIXABAN 5 MG PO TABS
5.0000 mg | ORAL_TABLET | Freq: Two times a day (BID) | ORAL | Status: DC
  Administered 2021-01-08 – 2021-01-09 (×3): 5 mg
  Filled 2021-01-08 (×3): qty 1

## 2021-01-08 MED ORDER — AMIODARONE HCL 200 MG PO TABS
200.0000 mg | ORAL_TABLET | Freq: Every day | ORAL | Status: DC
  Administered 2021-01-08 – 2021-01-09 (×2): 200 mg
  Filled 2021-01-08 (×2): qty 1

## 2021-01-08 MED ORDER — VITAL HIGH PROTEIN PO LIQD: 1000.0000 mL | ORAL | Status: DC

## 2021-01-08 MED ORDER — POLYETHYLENE GLYCOL 3350 17 G PO PACK
17.0000 g | PACK | Freq: Every day | ORAL | Status: DC
  Filled 2021-01-08: qty 1

## 2021-01-08 MED ORDER — PROPYLTHIOURACIL 50 MG PO TABS
50.0000 mg | ORAL_TABLET | Freq: Two times a day (BID) | ORAL | Status: DC
  Administered 2021-01-08 – 2021-01-09 (×3): 50 mg
  Filled 2021-01-08 (×4): qty 1

## 2021-01-08 MED ORDER — SODIUM CHLORIDE 0.9 % IV SOLN
2.0000 g | Freq: Three times a day (TID) | INTRAVENOUS | Status: AC
  Administered 2021-01-08: 2 g via INTRAVENOUS
  Filled 2021-01-08: qty 2

## 2021-01-08 MED ORDER — VALPROIC ACID 250 MG/5ML PO SOLN
500.0000 mg | Freq: Two times a day (BID) | ORAL | Status: DC
  Administered 2021-01-08 – 2021-01-09 (×3): 500 mg
  Filled 2021-01-08 (×4): qty 10

## 2021-01-08 MED ORDER — POTASSIUM PHOSPHATES 15 MMOLE/5ML IV SOLN
30.0000 mmol | Freq: Once | INTRAVENOUS | Status: AC
  Administered 2021-01-08: 30 mmol via INTRAVENOUS
  Filled 2021-01-08: qty 10

## 2021-01-08 MED ORDER — VITAL AF 1.2 CAL PO LIQD
1000.0000 mL | ORAL | Status: DC
  Administered 2021-01-08 – 2021-01-09 (×2): 1000 mL
  Filled 2021-01-08 (×2): qty 1000

## 2021-01-08 MED ORDER — PROSOURCE TF PO LIQD: 45.0000 mL | Freq: Two times a day (BID) | ORAL | Status: DC

## 2021-01-08 MED ORDER — SENNA 8.6 MG PO TABS
1.0000 | ORAL_TABLET | Freq: Every day | ORAL | Status: DC
  Administered 2021-01-08: 8.6 mg via ORAL
  Filled 2021-01-08: qty 1

## 2021-01-08 NOTE — Progress Notes (Signed)
K+ 3.4, Phos 1.7, Mg 1.4  Replaced per protocol

## 2021-01-08 NOTE — Progress Notes (Addendum)
Pharmacy Antibiotic Note  Lindsey Collins is a 75 y.o. female admitted on 01/07/2021 with alterted mental status.  Pharmacy has been consulted for Vancomcyin/Cefepime dosing for sepsis.  WBC 23.2 yesterday, CBC still pending for today. LA down to 1.6. CXR with opacities, suspected HCAP and MRSA PCR positive.  Improved renal function since admission, will adjust antibiotics.  Blood cultures with no growth so far. Yesterday there appeared to potentially be a BCID, lab confirmed no BCID has been run. Will run if cultures start to growth something.Trach aspirate with few gram variable rods, per lab results expected in 3 hours.  Plan: Adjusted Vancomycin to 1250 mg IV q24h >>Estimated AUC 538 w/ Scr 0.8, wt 95.6 kg Cefepime adjusted to 2 g IV q8h F/u cultures, renal function, clinical improvement, levels as needed  Height: 5\' 4"  (162.6 cm) Weight: 95.6 kg (210 lb 12.2 oz) IBW/kg (Calculated) : 54.7  Temp (24hrs), Avg:98.5 F (36.9 C), Min:97.8 F (36.6 C), Max:99.6 F (37.6 C)  Recent Labs  Lab 01/07/21 0306 01/07/21 0656 01/07/21 1306 01/07/21 1754 01/08/21 0050  WBC 23.2*  --   --   --   --   CREATININE 1.09*  --  0.86  --  0.73  LATICACIDVEN 2.6* 5.2*  --  1.6  --     Estimated Creatinine Clearance: 69.2 mL/min (by C-G formula based on SCr of 0.73 mg/dL).    No Known Allergies  Antimicrobials this admission: Vanc 4/26 >> Cefepime 4/26 >> Azith 4/26 x1  Microbiology results: 4/26 Bcx: ngtd 4/26 Ucx: sent 4/26 Trach Aspirate Culture: few gram variable rods 4/26 MRSA PCR: positive  Thank you for allowing pharmacy to be a part of this patient's care.  Fara Olden, PharmD PGY-1 Pharmacy Resident 01/08/2021 10:37 AM Please see AMION for all pharmacy numbers

## 2021-01-08 NOTE — Progress Notes (Signed)
NAME:  Lindsey Collins, MRN:  [id number], DOB:  15-May-1946, LOS: 1 ADMISSION DATE:  01/07/2021, CONSULTATION DATE: January 07, 2021 REFERRING MD: [facility] emergency room here, CHIEF COMPLAINT: Fever shortness of breath altered mental status  History of Present Illness:  Patient is a 75 years old Caucasian female morbidly obese bees has been on trach for the past 2 years after MRSA pneumonia she is at [facility] for rehab she has chronic respiratory failure now trach dependent diastolic CHF chronic type 2 diabetes Parkinson disease bipolar disorder she was found to have acute altered mental status with respiratory distress fever was sent to the ED temperature was found to be 101 she was brought back to the ventilator through her trach we have Reviewed that she has right upper lobe pneumonia so she is admitted to the ICU last month March end of March patient was found to have Serratia pneumonia she was treated with cefepime she had an episode of tachycardia at that time which was most likely related to sepsis  Pertinent  Medical History  Tracheostomy.  Obesity.  Type 2 diabetes.  Parkinson.  Bipolar.  Chronic trach.  Chronic respiratory failure  INTERVAL EVENTS:  4/27: attempted on tct yesterday afternoon but unsuccessful, was placed back on a/c. Pt became hypoxic and anxious. On minimal vent settings however. Peg tube still with darkened material within. Pt denies pain. Mg replaced. Lactate finally clearning 4/26: admitted this am. Chart review and exam.   Objective   Blood pressure (!) 120/52, pulse 66, temperature 98 F (36.7 C), temperature source Axillary, resp. rate 19, height 5\' 4"  (1.626 m), weight 95.6 kg, SpO2 98 %.    Vent Mode: PRVC FiO2 (%):  [40 %] 40 % Set Rate:  [24 bmp] 24 bmp Vt Set:  [450 mL] 450 mL PEEP:  [5 cmH20] 5 cmH20 Plateau Pressure:  [15 cmH20-28 cmH20] 15 cmH20   Intake/Output Summary (Last 24 hours) at 01/08/2021 5027 Last data filed at 01/08/2021  7412 Gross per 24 hour  Intake 2337.86 ml  Output 730 ml  Net 1607.86 ml   Filed Weights   01/07/21 0500 01/08/21 0344  Weight: 92.4 kg 95.6 kg    Examination: General:   Well-nourished no acute distress on trach on vent, sitting up in bed with yonker HEENT: ncat, eomi, perrla, mmmp Neck:  trach in place Vascular: No carotid bruits; 4/4 extremity pulses 2+   Cardiac:  rrr, distant Lungs:   diffuse rhonchi bilaterally Abd: obese, peg tube in place with dark/feculent contents within, soft, nontender Ext: Positive edema b/l le with pitting Musculoskeletal:  No deformities.  Skin: warm and dry  Neuro: awake and attempting to converse with trach in place  Assessment & Plan:   Acute on top of CHRONIC hypoxic/hypercarbic respiratory failure: -chronic trach vent (however was able to wean to tc with pmv use at outside hospital) -f/u resp cx -anticipate transfer back to [facility] when stabilized -rvp negative -attempted on tct yesterday without success (became anxious and hypoxic to low 80s) -tried on cpap this am with elevated ps but tv remained small  History of Serratia pneumonia:  -she was treated with cefepime -placed back on this -f/u cx Sepsis 2/2 hcap:  -wbc 23K... repeat in am.   Chronic macrocytic anemia:  -thankfully contents in ng are negative via hemoccult -change ppi to daily -transition back to full dose a/c -no acute indication for transfusion -follow trend GIB: ruled out -hemoccult negative -ppi for GI prophy in light  of vent -Resume eliquis -start tf  Lactic acidosis:  -follow trend -resolved  Dm2 with hyperglycemia:  -ssi -a1c 7.2 -hold lantus for now in light of need to hold tf  H/o parkinson's disease Acute metabolic encephhlopathy:  -cth negative for acute process -resolved encephalopathy   HFpEF:  - per chart -no echo on file H/o arrhythmia (unspecifed) -on eliquis.  -monitoring on tele  Hypomag: Hypokalemia:  -replace Labs    CBC: Recent Labs  Lab 01/07/21 0156 01/07/21 0306 01/07/21 0326  WBC  --  23.2*  --   NEUTROABS  --  20.6*  --   HGB 11.9* 11.2* 11.6*  HCT 35.0* 37.5 34.0*  MCV  --  101.1*  --   PLT  --  264  --     Basic Metabolic Panel: Recent Labs  Lab 01/07/21 0156 01/07/21 0306 01/07/21 0326 01/07/21 1306 01/08/21 0050  NA 136 137 138 139 140  K 4.4 5.6* 4.6 3.9 3.4*  CL  --  90*  --  95* 101  CO2  --  39*  --  33* 32  GLUCOSE  --  290*  --  178* 127*  BUN  --  14  --  15 12  CREATININE  --  1.09*  --  0.86 0.73  CALCIUM  --  8.6*  --  8.3* 8.3*  MG  --   --   --   --  1.4*  PHOS  --   --   --   --  1.7*   GFR: Estimated Creatinine Clearance: 69.2 mL/min (by C-G formula based on SCr of 0.73 mg/dL). Recent Labs  Lab 01/07/21 0306 01/07/21 0656 01/07/21 1754  WBC 23.2*  --   --   LATICACIDVEN 2.6* 5.2* 1.6    Liver Function Tests: Recent Labs  Lab 01/07/21 0306  AST 24  ALT 9  ALKPHOS 60  BILITOT 0.8  PROT 6.6  ALBUMIN 2.9*   No results for input(s): LIPASE, AMYLASE in the last 168 hours. No results for input(s): AMMONIA in the last 168 hours.  ABG    Component Value Date/Time   PHART 7.324 (L) 01/07/2021 0326   PCO2ART 74.2 (HH) 01/07/2021 0326   PO2ART 69 (L) 01/07/2021 0326   HCO3 38.1 (H) 01/07/2021 0326   TCO2 40 (H) 01/07/2021 0326   O2SAT 89.0 01/07/2021 0326     Coagulation Profile: Recent Labs  Lab 01/07/21 0306  INR 1.2    Cardiac Enzymes: No results for input(s): CKTOTAL, CKMB, CKMBINDEX, TROPONINI in the last 168 hours.  HbA1C: Hgb A1c MFr Bld  Date/Time Value Ref Range Status  12/20/2020 01:17 PM 7.2 (H) 4.8 - 5.6 % Final    Comment:    (NOTE) Pre diabetes:          5.7%-6.4%  Diabetes:              >6.4%  Glycemic control for   <7.0% adults with diabetes     CBG: Recent Labs  Lab 01/07/21 1519 01/07/21 1922 01/07/21 2344 01/08/21 0319 01/08/21 0745  GLUCAP 114* 97 102* 131* 152*   Critical care time: The  patient is critically ill with multiple organ systems failure and requires high complexity decision making for assessment and support, frequent evaluation and titration of therapies, application of advanced monitoring technologies and extensive interpretation of multiple databases.  Additional Critical care time 39 mins. This represents my time independent of the NPs time taking care of the pt. This is  excluding procedures.    Audria Nine DO [place] [facility] 01/08/2021, 8:37 AM See Amion for pager If no response to pager, please call [phone] until 1900 After 1900 please call [facility] [phone]

## 2021-01-08 NOTE — Progress Notes (Signed)
SLP Cancellation Note  Patient Details Name: Lindsey Collins MRN: [id number] DOB: 1945/12/10   Cancelled treatment:       Reason Eval/Treat Not Completed: Patient not medically ready (Pt remains on the vent at this time. SLP will follow up on subsequent date unless contacted sooner by staff.)  Tobie Poet I. Hardin Negus, [place], [place] Office number [phone] Pager [place] 01/08/2021, 11:27 AM

## 2021-01-08 NOTE — Progress Notes (Signed)
Nutrition Follow-up  DOCUMENTATION CODES:   Obesity unspecified  INTERVENTION:   Initiate tube feeds via PEG: - Start Vital AF 1.2 @ 40 ml/hr and advance by 10 ml q 4 hours to goal rate of 60 ml/hr (1440 ml/day)  Tube feeding regimen provides 1728 kcal, 108 grams of protein, and 1168 ml of H2O.   NUTRITION DIAGNOSIS:   Inadequate oral intake related to inability to eat as evidenced by NPO status.  Ongoing, being addressed via TF  GOAL:   Patient will meet greater than or equal to 90% of their needs  Met via TF at goal  MONITOR:   Vent status,Labs,Weight trends,I & O's,TF tolerance  REASON FOR ASSESSMENT:   Ventilator,Consult Enteral/tube feeding initiation and management  ASSESSMENT:   75 year old female who presented to the ED from [facility] on 4/26 with AMS. PMH of chronic respiratory failure s/p trach, CHF, T2DM, Parkinson's, bipolar disorder. Pt admitted with sepsis secondary to HCAP.  Consult received for tube feeding initiation and management.  Discussed pt with RN and during ICU rounds. PEG tubing is dark in color; however, material coming from PEG tube into cannister appears to resemble gastric secretions.  Per notes, trach collar attempted yesterday without success.  Admit weight: 92.4 kg Current weight: 95.6 kg  Patient is on vent support via trach MV: 8.6 L/min Temp (24hrs), Avg:98.3 F (36.8 C), Min:97.8 F (36.6 C), Max:99.6 F (37.6 C)  Medications reviewed and include: sinemet, colace, SSI q 4 hours, protonix, miralax, senna, IV abx, IV potassium phosphate 30 mmol once, IV magnesium sulfate once  Labs reviewed: potassium 3.4, phosphorus 1.7, magnesium 1.4  UOP: 1100 ml x 24 hours I/O's: +5.1 L since admit  NUTRITION - FOCUSED PHYSICAL EXAM:  Flowsheet Row Most Recent Value  Orbital Region No depletion  Upper Arm Region No depletion  Thoracic and Lumbar Region No depletion  Buccal Region No depletion  Temple Region No depletion   Clavicle Bone Region No depletion  Clavicle and Acromion Bone Region No depletion  Scapular Bone Region Unable to assess  Dorsal Hand No depletion  Patellar Region No depletion  Anterior Thigh Region No depletion  Posterior Calf Region No depletion  Edema (RD Assessment) Mild  Hair Reviewed  Eyes Reviewed  Mouth Reviewed  Skin Reviewed  Nails Reviewed       Diet Order:   Diet Order            Diet NPO time specified  Diet effective now                 EDUCATION NEEDS:   No education needs have been identified at this time  Skin:  Skin Assessment: Reviewed RN Assessment  Last BM:  01/08/21 medium type 4  Height:   Ht Readings from Last 1 Encounters:  01/07/21 '5\' 4"'  (1.626 m)    Weight:   Wt Readings from Last 1 Encounters:  01/08/21 95.6 kg    Ideal Body Weight:  54.5 kg  BMI:  Body mass index is 36.18 kg/m.  Estimated Nutritional Needs:   Kcal:  1600-1800  Protein:  105-120 grams  Fluid:  1.6-1.8 L    Gustavus Bryant, MS, RD, LDN Inpatient Clinical Dietitian Please see AMiON for contact information.

## 2021-01-09 LAB — GLUCOSE, CAPILLARY
Glucose-Capillary: 179 mg/dL — ABNORMAL HIGH (ref 70–99)
Glucose-Capillary: 214 mg/dL — ABNORMAL HIGH (ref 70–99)
Glucose-Capillary: 241 mg/dL — ABNORMAL HIGH (ref 70–99)

## 2021-01-09 LAB — CBC
HCT: 28.3 % — ABNORMAL LOW (ref 36.0–46.0)
Hemoglobin: 8.8 g/dL — ABNORMAL LOW (ref 12.0–15.0)
MCH: 29.9 pg (ref 26.0–34.0)
MCHC: 31.1 g/dL (ref 30.0–36.0)
MCV: 96.3 fL (ref 80.0–100.0)
Platelets: 221 10*3/uL (ref 150–400)
RBC: 2.94 MIL/uL — ABNORMAL LOW (ref 3.87–5.11)
RDW: 16.2 % — ABNORMAL HIGH (ref 11.5–15.5)
WBC: 10.1 10*3/uL (ref 4.0–10.5)
nRBC: 0 % (ref 0.0–0.2)

## 2021-01-09 LAB — BASIC METABOLIC PANEL
Anion gap: 9 (ref 5–15)
BUN: 12 mg/dL (ref 8–23)
CO2: 27 mmol/L (ref 22–32)
Calcium: 8 mg/dL — ABNORMAL LOW (ref 8.9–10.3)
Chloride: 101 mmol/L (ref 98–111)
Creatinine, Ser: 0.7 mg/dL (ref 0.44–1.00)
GFR, Estimated: >60 mL/min (ref >60)
Glucose, Bld: 223 mg/dL — ABNORMAL HIGH (ref 70–99)
Potassium: 3.3 mmol/L — ABNORMAL LOW (ref 3.5–5.1)
Sodium: 137 mmol/L (ref 135–145)

## 2021-01-09 LAB — CULTURE, RESPIRATORY W GRAM STAIN

## 2021-01-09 LAB — MAGNESIUM: Magnesium: 2.2 mg/dL (ref 1.7–2.4)

## 2021-01-09 LAB — PHOSPHORUS: Phosphorus: 3.5 mg/dL (ref 2.5–4.6)

## 2021-01-09 LAB — SARS CORONAVIRUS 2 (TAT 6-24 HRS): SARS Coronavirus 2: NEGATIVE

## 2021-01-09 MED ORDER — POTASSIUM CHLORIDE 20 MEQ PO PACK
20.0000 meq | PACK | ORAL | Status: AC
  Administered 2021-01-09 (×2): 20 meq
  Filled 2021-01-09 (×2): qty 1

## 2021-01-09 MED ORDER — POTASSIUM CHLORIDE 10 MEQ/100ML IV SOLN
10.0000 meq | INTRAVENOUS | Status: AC
  Administered 2021-01-09 (×3): 10 meq via INTRAVENOUS
  Filled 2021-01-09 (×4): qty 100

## 2021-01-09 MED ORDER — DEXTROSE 50 % IV SOLN
INTRAVENOUS | Status: AC
  Filled 2021-01-09: qty 50

## 2021-01-09 MED ORDER — INSULIN GLARGINE 100 UNIT/ML ~~LOC~~ SOLN
10.0000 [IU] | Freq: Every day | SUBCUTANEOUS | Status: DC
  Administered 2021-01-09: 10 [IU] via SUBCUTANEOUS
  Filled 2021-01-09: qty 0.1

## 2021-01-09 MED ORDER — POTASSIUM CHLORIDE 10 MEQ/100ML IV SOLN
10.0000 meq | Freq: Once | INTRAVENOUS | Status: AC
  Administered 2021-01-09: 10 meq via INTRAVENOUS

## 2021-01-09 MED ORDER — SODIUM CHLORIDE 0.9 % IV SOLN: 2.0000 g | Freq: Three times a day (TID) | INTRAVENOUS | 0 refills | Status: DC

## 2021-01-09 NOTE — Discharge Summary (Signed)
Physician Discharge Summary  Patient ID: Lindsey Collins MRN: [id number] DOB/AGE: 75/02/1946 74 y.o.  Admit date: 01/07/2021 Discharge date: 01/09/2021  Admission Diagnoses: Serratia pneumonia  Discharge Diagnoses:  Active Problems:   Acute on chronic respiratory failure with hypoxia and hypercapnia (HCC)   Community acquired pneumonia of right lung   Discharged Condition: fair  Hospital Course:   Per H&P on 4/26: Patient is a 74 years old Caucasian female morbidly obese bees has been on trach for the past 2 years after MRSA pneumonia she is at [facility] for rehab she has chronic respiratory failure now trach dependent diastolic CHF chronic type 2 diabetes Parkinson disease bipolar disorder she was found to have acute altered mental status with respiratory distress fever was sent to the ED temperature was found to be 101 she was brought back to the ventilator through her trach we have Reviewed that she has right upper lobe pneumonia so she is admitted to the ICU last month March end of March patient was found to have Serratia pneumonia she was treated with cefepime she had an episode of tachycardia at that time which was most likely related to sepsis.   She was somnolent and on escalated vent settings on presenation and over the next 2-3 days we were able to wean her ventilator settings. She is alert,  Conversant over trach. She is tolerating tf and appears improved. Hopefully she will cont to progress back to tct soon.   Additionally her peg tube appeared to have feculent matter in the tube on presentation. This was tested for blood and was negative. Tf were started and toelrated. She was also started on bowel regimen in order to facilitate this and should be continued as outpt.   Pt will need 6 more days of IV abx for her serratia pneumonia at her facility. IV access should be discontinued as soon as completed.   Consults: None  Significant Diagnostic Studies: microbiology: sputum  culture: positive for serratia  Treatments: IV hydration and antibiotics: cefepime  Discharge Exam: Blood pressure (!) 99/52, pulse 74, temperature 98 F (36.7 C), temperature source Axillary, resp. rate (!) 24, height 5' 4" (1.626 m), weight 94.5 kg, SpO2 100 %. See physical exam from today.   Disposition:  There are no questions and answers to display.        Discharge Instructions    [facility] pharmacist to adjust dose for Vancomycin, Aminoglycosides and other anti-infective therapies as requested by physician.   Complete by: As directed    [facility] to provide Cath Flo 2mg   Complete by: As directed    Administer for PICC line occlusion and as ordered by physician for other access device issues.   Anaphylaxis Kit: Provided to treat any anaphylactic reaction to the medication being provided to the patient if First Dose or when requested by physician   Complete by: As directed    Epinephrine 1mg/ml vial / amp: Administer 0.3mg (0.3ml) subcutaneously once for moderate to severe anaphylaxis, nurse to call physician and pharmacy when reaction occurs and call 911 if needed for immediate care   Diphenhydramine 50mg/ml IV vial: Administer 25-50mg IV/IM PRN for first dose reaction, rash, itching, mild reaction, nurse to call physician and pharmacy when reaction occurs   Sodium Chloride 0.9% NS 500ml IV: Administer if needed for hypovolemic blood pressure drop or as ordered by physician after call to physician with anaphylactic reaction   Change dressing on IV access line weekly and PRN     Complete by: As directed    Flush IV access with Sodium Chloride 0.9% and Heparin 10 units/ml or 100 units/ml   Complete by: As directed    Home infusion instructions - [facility]   Complete by: As directed    Instructions: Flush IV access with Sodium Chloride 0.9% and Heparin 10units/ml or 100units/ml   Change dressing on IV access line: Weekly and PRN    Instructions Cath Flo 2mg: Administer for PICC Line occlusion and as ordered by physician for other access device   [facility] pharmacist to adjust dose for: Vancomycin, Aminoglycosides and other anti-infective therapies as requested by physician   Method of administration may be changed at the discretion of home infusion pharmacist based upon assessment of the patient and/or caregiver's ability to self-administer the medication ordered   Complete by: As directed    Outpatient Parenteral Antibiotic Therapy Information Antibiotic: Cefepime (Maxipime) IVPB; Indications for use: serratia pna; End Date: 01/15/2021   Complete by: As directed    Antibiotic: Cefepime (Maxipime) IVPB   Indications for use: serratia pna   End Date: 01/15/2021     Allergies as of 01/09/2021   No Known Allergies     Medication List    TAKE these medications   allopurinol 100 MG tablet Commonly known as: ZYLOPRIM Take 200 mg by mouth daily.   amiodarone 200 MG tablet Commonly known as: PACERONE Take 200 mg by mouth daily.   atorvastatin 20 MG tablet Commonly known as: LIPITOR Take 20 mg by mouth at bedtime.   carbidopa-levodopa 25-100 MG tablet Commonly known as: SINEMET IR Take 1 tablet by mouth every 6 (six) hours.   ceFEPIme 2 g in sodium chloride 0.9 % 100 mL Inject 2 g into the vein every 8 (eight) hours for 6 days.   chlorhexidine 0.12 % solution Commonly known as: PERIDEX Use as directed 15 mLs in the mouth or throat 2 (two) times daily.   cholecalciferol 25 MCG (1000 UNIT) tablet Commonly known as: VITAMIN D3 Take 1,000 Units by mouth daily.   divalproex 125 MG capsule Commonly known as: DEPAKOTE SPRINKLE Take 500 mg by mouth 2 (two) times daily.   Eliquis 5 MG Tabs tablet Generic drug: apixaban Take 1 tablet by mouth 2 (two) times daily.   FLUoxetine 10 MG tablet Commonly known as: PROZAC Take 10 mg by mouth daily.   furosemide 20 MG tablet Commonly known as: LASIX Take  20 mg by mouth every 12 (twelve) hours.   insulin aspart 100 UNIT/ML injection Commonly known as: novoLOG Inject 0-10 Units into the skin every 6 (six) hours. Sliding scale   insulin glargine 100 UNIT/ML injection Commonly known as: LANTUS Inject 44 Units into the skin at bedtime.   ipratropium-albuterol 0.5-2.5 (3) MG/3ML Soln Commonly known as: DUONEB Take 3 mLs by nebulization every 4 (four) hours as needed (shortness of breath).   magnesium oxide 400 MG tablet Commonly known as: MAG-OX Take 400 mg by mouth 2 (two) times daily.   melatonin 3 MG Tabs tablet Take 6 mg by mouth at bedtime.   memantine 5 MG tablet Commonly known as: NAMENDA Take 5 mg by mouth 2 (two) times daily.   metoprolol tartrate 25 MG tablet Commonly known as: LOPRESSOR Take 25 mg by mouth 2 (two) times daily.   miconazole 2 % powder Commonly known as: MICOTIN Apply 1 application topically every 12 (twelve) hours.   OLANZapine 10 MG tablet Commonly known as: ZYPREXA Take 10 mg by   mouth at bedtime.   pantoprazole 40 MG tablet Commonly known as: PROTONIX Take 40 mg by mouth daily.   polyethylene glycol 17 g packet Commonly known as: MIRALAX / GLYCOLAX Take 17 g by mouth daily.   potassium chloride 20 MEQ packet Commonly known as: KLOR-CON Take 40 mEq by mouth daily.   pramipexole 0.125 MG tablet Commonly known as: MIRAPEX Take 0.125 mg by mouth every 8 (eight) hours.   propylthiouracil 50 MG tablet Commonly known as: PTU Take 50 mg by mouth every 12 (twelve) hours.   senna-docusate 8.6-50 MG tablet Commonly known as: Senokot-S Take 2 tablets by mouth at bedtime.            Discharge Care Instructions  (From admission, onward)         Start     Ordered   01/09/21 0000  Change dressing on IV access line weekly and PRN  (Home infusion instructions - [facility] )        01/09/21 1213           Signed: Audria Nine 01/09/2021, 12:21 PM

## 2021-01-09 NOTE — Progress Notes (Signed)
Pharmacy Antibiotic Note  Lindsey Collins is a 75 y.o. female admitted on 01/07/2021 with alterted mental status.  Pharmacy has been consulted for Cefepime dosing for sepsis.  WBC improved to 10.1. LA down to 1.6. 4/26 CXR with opacities, suspected HCAP and MRSA PCR positive. Renal function stable.  Blood cultures continues with no growth so far. Trach aspirate grew serratia marcescens, cefepime sensitive, CTX intermediate. Continue Cefepime, vancomycin discontinued per MD.  Plan: Stop vancomcyin Continue cefepime to 2 g IV q8h, end date added for 5/2 (duration = 7 days) Monitor renal function, continued clinical improvement  Height: 5\' 4"  (162.6 cm) Weight: 94.5 kg (208 lb 5.4 oz) IBW/kg (Calculated) : 54.7  Temp (24hrs), Avg:98 F (36.7 C), Min:97.8 F (36.6 C), Max:98.4 F (36.9 C)  Recent Labs  Lab 01/07/21 0306 01/07/21 0656 01/07/21 1306 01/07/21 1754 01/08/21 0050 01/08/21 1017 01/09/21 0437  WBC 23.2*  --   --   --   --  10.1 10.1  CREATININE 1.09*  --  0.86  --  0.73  --  0.70  LATICACIDVEN 2.6* 5.2*  --  1.6  --   --   --     Estimated Creatinine Clearance: 68.8 mL/min (by C-G formula based on SCr of 0.7 mg/dL).    No Known Allergies  Antimicrobials this admission: Vanc 4/26 >>4/28 Cefepime 4/26 >> (5/2) Azith 4/26 x1  Microbiology results: 4/26 Bcx: ngtd 4/26 Ucx: sent 4/26 Trach Aspirate Culture: serratia marcescens 4/26 MRSA PCR: positive  Thank you for allowing pharmacy to be a part of this patient's care.  Fara Olden, PharmD PGY-1 Pharmacy Resident 01/09/2021 9:49 AM Please see AMION for all pharmacy numbers

## 2021-01-09 NOTE — TOC Transition Note (Addendum)
Transition of Care [facility]) - CM/SW Discharge Note   Patient Details  Name: Lindsey Collins MRN: [id number] Date of Birth: [birth year]  [facility] CM/SW Contact:  Bartholomew Crews, RN Phone Number: [phone] 01/09/2021, 12:21 PM   Clinical Narrative:     Notified that patient was ready to transition back to [facility] (Sub Acute Unit) for long term vent management. Received call back from Prem at [facility] - bed available for patient to transition to today. Patient will need rapid Covid test and DC summary. When patient ready will arrange [facility] transportation.   Spoke with patient's daughter, Lindsey Collins, to advise of transition back to [facility] today.  Patient to go to room 308-B. Admitting physician Dr. Abelardo Diesel. RN to call report to [phone].   No further TOC needs identified.   1518: Spoke with Prem. [facility] able to accept patient at facility ahead of covid test resulting. [facility] arranged for ventilator transport. Transport paperwork on chart. Will fax covid results to [phone].   Final next level of care: [facility] (vent SNF for long term care) Barriers to Discharge: No Barriers Identified   Patient Goals and CMS Choice Patient states their goals for this hospitalization and ongoing recovery are:: return to [facility] CMS Medicare.gov Compare Post Acute Care list provided to:: Patient Choice offered to / list presented to : Patient,Adult Children Lindsey Collins, daughter)  Discharge Placement              Patient chooses bed at: [facility] Patient to be transferred to facility by: [facility] Name of family member notified: Lindsey Collins Patient and family notified of of transfer: 01/09/21  Discharge Plan and Services                DME Arranged: N/A DME Agency: NA       HH Arranged: NA HH Agency: NA        Social Determinants of Health (SDOH) Interventions      Readmission Risk Interventions No flowsheet data found.

## 2021-01-09 NOTE — Progress Notes (Signed)
NAME:  Lindsey Collins, MRN:  [id number], DOB:  June 26, 1946, LOS: 2 ADMISSION DATE:  01/07/2021, CONSULTATION DATE: January 07, 2021 REFERRING MD: [facility] emergency room here, CHIEF COMPLAINT: Fever shortness of breath altered mental status  History of Present Illness:  Patient is a 75 years old Caucasian female morbidly obese bees has been on trach for the past 2 years after MRSA pneumonia she is at [facility] for rehab she has chronic respiratory failure now trach dependent diastolic CHF chronic type 2 diabetes Parkinson disease bipolar disorder she was found to have acute altered mental status with respiratory distress fever was sent to the ED temperature was found to be 101 she was brought back to the ventilator through her trach we have Reviewed that she has right upper lobe pneumonia so she is admitted to the ICU last month March end of March patient was found to have Serratia pneumonia she was treated with cefepime she had an episode of tachycardia at that time which was most likely related to sepsis  Pertinent  Medical History  Tracheostomy.  Obesity.  Type 2 diabetes.  Parkinson.  Bipolar.  Chronic trach.  Chronic respiratory failure  INTERVAL EVENTS:  4/28: awake and on vent. No acute issues overnight. BS up so will add lantus 10U 4/27: attempted on tct yesterday afternoon but unsuccessful, was placed back on a/c. Pt became hypoxic and anxious. On minimal vent settings however. Peg tube still with darkened material within. Pt denies pain. Mg replaced. Lactate finally clearning 4/26: admitted this am. Chart review and exam.   Objective   Blood pressure (!) 105/49, pulse 70, temperature 97.9 F (36.6 C), temperature source Axillary, resp. rate (!) 24, height 5\' 4"  (1.626 m), weight 94.5 kg, SpO2 100 %.    Vent Mode: PRVC FiO2 (%):  [40 %] 40 % Set Rate:  [24 bmp] 24 bmp Vt Set:  [450 mL] 450 mL PEEP:  [5 cmH20] 5 cmH20 Plateau Pressure:  [24 cmH20-29 cmH20] 24 cmH20    Intake/Output Summary (Last 24 hours) at 01/09/2021 0936 Last data filed at 01/09/2021 0932 Gross per 24 hour  Intake 2019.84 ml  Output 1300 ml  Net 719.84 ml   Filed Weights   01/07/21 0500 01/08/21 0344 01/09/21 0316  Weight: 92.4 kg 95.6 kg 94.5 kg    Examination: General:   Well-nourished no acute distress on trach on vent, reclining comfortably in bed HEENT: ncat, eomi, perrla, mmmp Neck:  trach in place Vascular: No carotid bruits; 4/4 extremity pulses 2+   Cardiac:  rrr, distant Lungs:   diffuse rhonchi bilaterally Abd: obese, peg tube in place with dark/feculent contents within, soft, nontender Ext: Positive edema b/l le with pitting Musculoskeletal:  No deformities.  Skin: warm and dry  Neuro: awake and attempting to converse with trach in place  Assessment & Plan:   Acute on top of CHRONIC hypoxic/hypercarbic respiratory failure: -chronic trach vent (however was able to wean to tc with pmv use at outside hospital) -resp cx still with serratia ? Inadequate therapy previously vs colonization but pt did present clinically ill and is improved greatly with treatment this round -anticipate transfer back to [facility] when stabilized -rvp negative -attempted on tct yesterday without success (became anxious and hypoxic to low 80s) -tried on cpap this am with elevated ps but tv remained small  Serratia pneumonia:  -she was treated with cefepime -placed back on this with clinical improvement.  -cont for 7 days, end dates placed -stop vanc  Sepsis 2/2 hcap:  -wbc improved to 10  Chronic macrocytic anemia:  -thankfully contents in ng are negative via hemoccult -change ppi to daily  -cont home full dose a/c -no acute indication for transfusion -follow trend GIB: ruled out -hemoccult negative -ppi for GI prophy in light of vent -remains on eliquis - cont tf  Lactic acidosis:  -follow trend -resolved  Dm2 with hyperglycemia:  -ssi -a1c 7.2 -adding lantus 10U  with hyperglycemia  H/o parkinson's disease Acute metabolic encephhlopathy:  -cth negative for acute process -resolved encephalopathy   HFpEF:  - per chart -no echo on file H/o arrhythmia (unspecifed) -on eliquis.  -monitoring on tele  Hypomag: resolved Hypokalemia:  -replace  Goals:  -pt stable for d/c back to [facility] if available.  Labs   CBC: Recent Labs  Lab 01/07/21 0156 01/07/21 0306 01/07/21 0326 01/08/21 1017 01/09/21 0437  WBC  --  23.2*  --  10.1 10.1  NEUTROABS  --  20.6*  --   --   --   HGB 11.9* 11.2* 11.6* 8.1* 8.8*  HCT 35.0* 37.5 34.0* 26.5* 28.3*  MCV  --  101.1*  --  96.4 96.3  PLT  --  264  --  195 240    Basic Metabolic Panel: Recent Labs  Lab 01/07/21 0306 01/07/21 0326 01/07/21 1306 01/08/21 0050 01/08/21 1017 01/08/21 1745 01/09/21 0437  NA 137 138 139 140  --   --  137  K 5.6* 4.6 3.9 3.4*  --   --  3.3*  CL 90*  --  95* 101  --   --  101  CO2 39*  --  33* 32  --   --  27  GLUCOSE 290*  --  178* 127*  --   --  223*  BUN 14  --  15 12  --   --  12  CREATININE 1.09*  --  0.86 0.73  --   --  0.70  CALCIUM 8.6*  --  8.3* 8.3*  --   --  8.0*  MG  --   --   --  1.4* 1.9 2.3 2.2  PHOS  --   --   --  1.7* 3.4 3.6 3.5   GFR: Estimated Creatinine Clearance: 68.8 mL/min (by C-G formula based on SCr of 0.7 mg/dL). Recent Labs  Lab 01/07/21 0306 01/07/21 0656 01/07/21 1754 01/08/21 1017 01/09/21 0437  WBC 23.2*  --   --  10.1 10.1  LATICACIDVEN 2.6* 5.2* 1.6  --   --     Liver Function Tests: Recent Labs  Lab 01/07/21 0306  AST 24  ALT 9  ALKPHOS 60  BILITOT 0.8  PROT 6.6  ALBUMIN 2.9*   No results for input(s): LIPASE, AMYLASE in the last 168 hours. No results for input(s): AMMONIA in the last 168 hours.  ABG    Component Value Date/Time   PHART 7.324 (L) 01/07/2021 0326   PCO2ART 74.2 (HH) 01/07/2021 0326   PO2ART 69 (L) 01/07/2021 0326   HCO3 38.1 (H) 01/07/2021 0326   TCO2 40 (H) 01/07/2021 0326   O2SAT 89.0  01/07/2021 0326     Coagulation Profile: Recent Labs  Lab 01/07/21 0306  INR 1.2    Cardiac Enzymes: No results for input(s): CKTOTAL, CKMB, CKMBINDEX, TROPONINI in the last 168 hours.  HbA1C: Hgb A1c MFr Bld  Date/Time Value Ref Range Status  12/20/2020 01:17 PM 7.2 (H) 4.8 - 5.6 % Final    Comment:    (  NOTE) Pre diabetes:          5.7%-6.4%  Diabetes:              >6.4%  Glycemic control for   <7.0% adults with diabetes     CBG: Recent Labs  Lab 01/08/21 1514 01/08/21 1920 01/08/21 2345 01/09/21 0311 01/09/21 0740  GLUCAP 128* 158* 186* 179* 214*   Critical care time: The patient is critically ill with multiple organ systems failure and requires high complexity decision making for assessment and support, frequent evaluation and titration of therapies, application of advanced monitoring technologies and extensive interpretation of multiple databases.  Additional Critical care time 35 mins. This represents my time independent of the NPs time taking care of the pt. This is excluding procedures.    Audria Nine DO [facility] 01/09/2021, 9:36 AM See Amion for pager If no response to pager, please call [phone] until 1900 After 1900 please call [facility] [phone]

## 2021-01-09 NOTE — Progress Notes (Signed)
K+3.3 ?Replaced per protocol  ?

## 2021-01-10 ENCOUNTER — Encounter (HOSPITAL_COMMUNITY): Payer: Self-pay

## 2021-01-10 ENCOUNTER — Other Ambulatory Visit: Payer: Self-pay

## 2021-01-10 ENCOUNTER — Emergency Department (HOSPITAL_COMMUNITY)
Admission: EM | Admit: 2021-01-10 | Discharge: 2021-01-10 | Disposition: A | Discharge status: Home / Self Care | Payer: Medicare Other | Attending: Emergency Medicine | Admitting: Emergency Medicine

## 2021-01-10 DIAGNOSIS — N182 Chronic kidney disease, stage 2 (mild): Secondary | ICD-10-CM | POA: Insufficient documentation

## 2021-01-10 DIAGNOSIS — R062 Wheezing: Secondary | ICD-10-CM | POA: Insufficient documentation

## 2021-01-10 DIAGNOSIS — Z794 Long term (current) use of insulin: Secondary | ICD-10-CM | POA: Insufficient documentation

## 2021-01-10 DIAGNOSIS — Z930 Tracheostomy status: Secondary | ICD-10-CM

## 2021-01-10 DIAGNOSIS — Z7901 Long term (current) use of anticoagulants: Secondary | ICD-10-CM | POA: Insufficient documentation

## 2021-01-10 DIAGNOSIS — I129 Hypertensive chronic kidney disease with stage 1 through stage 4 chronic kidney disease, or unspecified chronic kidney disease: Secondary | ICD-10-CM | POA: Insufficient documentation

## 2021-01-10 DIAGNOSIS — E1122 Type 2 diabetes mellitus with diabetic chronic kidney disease: Secondary | ICD-10-CM | POA: Diagnosis not present

## 2021-01-10 DIAGNOSIS — Z79899 Other long term (current) drug therapy: Secondary | ICD-10-CM | POA: Insufficient documentation

## 2021-01-10 DIAGNOSIS — D649 Anemia, unspecified: Secondary | ICD-10-CM

## 2021-01-10 DIAGNOSIS — J9611 Chronic respiratory failure with hypoxia: Secondary | ICD-10-CM

## 2021-01-10 DIAGNOSIS — R739 Hyperglycemia, unspecified: Secondary | ICD-10-CM

## 2021-01-10 LAB — CBC
HCT: 26.8 % — ABNORMAL LOW (ref 36.0–46.0)
Hemoglobin: 8 g/dL — ABNORMAL LOW (ref 12.0–15.0)
MCH: 29.6 pg (ref 26.0–34.0)
MCHC: 29.9 g/dL — ABNORMAL LOW (ref 30.0–36.0)
MCV: 99.3 fL (ref 80.0–100.0)
Platelets: 241 10*3/uL (ref 150–400)
RBC: 2.7 MIL/uL — ABNORMAL LOW (ref 3.87–5.11)
RDW: 15.9 % — ABNORMAL HIGH (ref 11.5–15.5)
WBC: 11.6 10*3/uL — ABNORMAL HIGH (ref 4.0–10.5)
nRBC: 0 % (ref 0.0–0.2)

## 2021-01-10 LAB — COMPREHENSIVE METABOLIC PANEL
ALT: 17 U/L (ref 0–44)
AST: 33 U/L (ref 15–41)
Albumin: 2.1 g/dL — ABNORMAL LOW (ref 3.5–5.0)
Alkaline Phosphatase: 72 U/L (ref 38–126)
Anion gap: 8 (ref 5–15)
BUN: 8 mg/dL (ref 8–23)
CO2: 26 mmol/L (ref 22–32)
Calcium: 8 mg/dL — ABNORMAL LOW (ref 8.9–10.3)
Chloride: 104 mmol/L (ref 98–111)
Creatinine, Ser: 0.78 mg/dL (ref 0.44–1.00)
GFR, Estimated: >60 mL/min (ref >60)
Glucose, Bld: 317 mg/dL — ABNORMAL HIGH (ref 70–99)
Potassium: 3.7 mmol/L (ref 3.5–5.1)
Sodium: 138 mmol/L (ref 135–145)
Total Bilirubin: 0.5 mg/dL (ref 0.3–1.2)
Total Protein: 5.4 g/dL — ABNORMAL LOW (ref 6.5–8.1)

## 2021-01-10 LAB — SAMPLE TO BLOOD BANK

## 2021-01-10 LAB — POC OCCULT BLOOD, ED: Fecal Occult Bld: POSITIVE — AB

## 2021-01-10 LAB — CBG MONITORING, ED: Glucose-Capillary: 239 mg/dL — ABNORMAL HIGH (ref 70–99)

## 2021-01-10 MED ORDER — FENTANYL CITRATE (PF) 100 MCG/2ML IJ SOLN: 50.0000 ug | Freq: Once | INTRAMUSCULAR | Status: DC

## 2021-01-10 MED ORDER — INSULIN ASPART 100 UNIT/ML IJ SOLN
6.0000 [IU] | Freq: Once | INTRAMUSCULAR | Status: AC
  Administered 2021-01-10: 6 [IU] via SUBCUTANEOUS

## 2021-01-10 NOTE — Progress Notes (Signed)
Placed pt on vent setting from previous admission.

## 2021-01-10 NOTE — ED Notes (Signed)
Attempted to call report to [facility].

## 2021-01-10 NOTE — ED Provider Notes (Signed)
[facility] EMERGENCY DEPARTMENT Provider Note   CSN: [id number] Arrival date & time: 01/10/21  0813     History Chief Complaint  Patient presents with  . Hypertension    Lindsey Collins is a 75 y.o. female.  Patient with hx chronic resp failure, on trach/vent at [facility], presents after episode of reported high blood pressure and transient apnea. On [facility] arrival to facility, patient was alert, at baseline mental state, breathing on vent, with normal bp. Recent d/c from hospital with pneumonia. Pt limited historian - level 5 caveat. Denies pain. No fever/chills.   The history is provided by the patient and the EMS personnel. The history is limited by the condition of the patient.  Hypertension Pertinent negatives include no chest pain, no abdominal pain and no headaches.       Past Medical History:  Diagnosis Date  . Acute on chronic respiratory failure with hypoxia ([place])   . ARDS (adult respiratory distress syndrome) ([place])   . Chronic kidney disease, stage II (mild)   . MRSA (methicillin resistant staphylococcus aureus) pneumonia ([place])   . Obstructive chronic bronchitis without exacerbation [facility])     Patient Active Problem List   Diagnosis Date Noted  . Community acquired pneumonia of right lung   . Chronic respiratory failure with hypoxia ([place]) 12/20/2020  . 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 Normocytic anemia 12/20/2020  . Parkinson's disease ([place]) 12/20/2020  . Diabetes mellitus type 2, uncontrolled ([place]) 12/20/2020  . Acute on chronic respiratory failure with hypoxia and hypercapnia ([facility])   . MRSA (methicillin resistant staphylococcus aureus) pneumonia ([place])   . MRSA (methicillin resistant staphylococcus aureus) pneumonia ([place])   . Obstructive chronic bronchitis without exacerbation ([place])   . Chronic kidney disease, stage II (mild)     Past Surgical History:  Procedure Laterality Date  . TRACHEOSTOMY       OB History   No  obstetric history on file.     Family History  Problem Relation Age of Onset  . CAD Mother        hx CABG       Home Medications Prior to Admission medications   Medication Sig Start Date End Date Taking? Authorizing Provider  allopurinol (ZYLOPRIM) 100 MG tablet Take 200 mg by mouth daily.    [provider]  amiodarone (PACERONE) 200 MG tablet Take 200 mg by mouth daily. 10/25/20   [provider]  atorvastatin (LIPITOR) 20 MG tablet Take 20 mg by mouth at bedtime. 11/26/20   [provider]  carbidopa-levodopa (SINEMET IR) 25-100 MG tablet Take 1 tablet by mouth every 6 (six) hours. 10/28/20   [provider]  ceFEPIme 2 g in sodium chloride 0.9 % 100 mL Inject 2 g into the vein every 8 (eight) hours for 6 days. 01/09/21 01/15/21  Audria Nine, DO  chlorhexidine (PERIDEX) 0.12 % solution Use as directed 15 mLs in the mouth or throat 2 (two) times daily.    [provider]  cholecalciferol (VITAMIN D3) 25 MCG (1000 UNIT) tablet Take 1,000 Units by mouth daily.    [provider]  divalproex (DEPAKOTE SPRINKLE) 125 MG capsule Take 500 mg by mouth 2 (two) times daily. 11/24/20   [provider]  ELIQUIS 5 MG TABS tablet Take 1 tablet by mouth 2 (two) times daily. 12/02/20   [provider]  FLUoxetine (PROZAC) 10 MG tablet Take 10 mg by mouth daily. 11/08/20   [provider]  furosemide (  LASIX) 20 MG tablet Take 20 mg by mouth every 12 (twelve) hours. 12/02/20   [provider]  insulin aspart (NOVOLOG) 100 UNIT/ML injection Inject 0-10 Units into the skin every 6 (six) hours. Sliding scale    [provider]  insulin glargine (LANTUS) 100 UNIT/ML injection Inject 44 Units into the skin at bedtime.    [provider]  ipratropium-albuterol (DUONEB) 0.5-2.5 (3) MG/3ML SOLN Take 3 mLs by nebulization every 4 (four) hours as needed (shortness of breath).    [provider]   magnesium oxide (MAG-OX) 400 MG tablet Take 400 mg by mouth 2 (two) times daily.    [provider]  melatonin 3 MG TABS tablet Take 6 mg by mouth at bedtime.    [provider]  memantine (NAMENDA) 5 MG tablet Take 5 mg by mouth 2 (two) times daily. 11/26/20   [provider]  metoprolol tartrate (LOPRESSOR) 25 MG tablet Take 25 mg by mouth 2 (two) times daily.    [provider]  miconazole (MICOTIN) 2 % powder Apply 1 application topically every 12 (twelve) hours.    [provider]  OLANZapine (ZYPREXA) 10 MG tablet Take 10 mg by mouth at bedtime. 11/16/20   [provider]  pantoprazole (PROTONIX) 40 MG tablet Take 40 mg by mouth daily.    [provider]  polyethylene glycol (MIRALAX / GLYCOLAX) 17 g packet Take 17 g by mouth daily.    [provider]  potassium chloride (KLOR-CON) 20 MEQ packet Take 40 mEq by mouth daily.    [provider]  pramipexole (MIRAPEX) 0.125 MG tablet Take 0.125 mg by mouth every 8 (eight) hours. 11/05/20   [provider]  propylthiouracil (PTU) 50 MG tablet Take 50 mg by mouth every 12 (twelve) hours. 10/19/20   [provider]  senna-docusate (SENOKOT-S) 8.6-50 MG tablet Take 2 tablets by mouth at bedtime.    [provider]    Allergies    Patient has no known allergies.  Review of Systems   Review of Systems  Unable to perform ROS: Other  Constitutional: Negative for fever.  Cardiovascular: Negative for chest pain.  Gastrointestinal: Negative for abdominal pain.  Neurological: Negative for headaches.  trach/vent - level 5 caveat.   Physical Exam Updated Vital Signs BP (!) 105/46   Pulse 95   Temp 99.2 F (37.3 C)   Resp (!) 24   SpO2 99%   Physical Exam Vitals and nursing note reviewed.  Constitutional:      Appearance: Normal appearance. She is well-developed.  HENT:     Head: Atraumatic.     Nose: Nose normal.     Mouth/Throat:      Mouth: Mucous membranes are moist.  Eyes:     General: No scleral icterus.    Conjunctiva/sclera: Conjunctivae normal.     Pupils: Pupils are equal, round, and reactive to light.  Neck:     Trachea: No tracheal deviation.     Comments: No stiffness or rigidity. Trach site intact without sign of infection.  Cardiovascular:     Rate and Rhythm: Normal rate and regular rhythm.     Pulses: Normal pulses.     Heart sounds: Normal heart sounds. No murmur heard. No friction rub. No gallop.   Pulmonary:     Effort: Pulmonary effort is normal. No respiratory distress.     Breath sounds: Normal breath sounds.     Comments: Mild wheezing. Good air movement.  Abdominal:     General: Bowel sounds are normal. There is no distension.     Palpations: Abdomen is soft.     Tenderness: There is no abdominal tenderness. There is no guarding.     Comments: Obese. Feeding tube site upper abdomen intact without sign of infection. Well healed surgical scars, no incarcerated hernia.   Genitourinary:    Comments: No cva tenderness.  Musculoskeletal:        General: No swelling or tenderness.     Cervical back: Normal range of motion and neck supple. No rigidity. No muscular tenderness.  Skin:    General: Skin is warm and dry.     Findings: No rash.  Neurological:     Mental Status: She is alert.     Comments: Awake and alert. Moves bil ext purposefully.   Psychiatric:        Mood and Affect: Mood normal.     ED Results / Procedures / Treatments   Labs (all labs ordered are listed, but only abnormal results are displayed) Results for orders placed or performed during the hospital encounter of 01/10/21  CBC  Result Value Ref Range   WBC 11.6 (H) 4.0 - 10.5 K/uL   RBC 2.70 (L) 3.87 - 5.11 MIL/uL   Hemoglobin 8.0 (L) 12.0 - 15.0 g/dL   HCT 26.8 (L) 36.0 - 46.0 %   MCV 99.3 80.0 - 100.0 fL   MCH 29.6 26.0 - 34.0 pg   MCHC 29.9 (L) 30.0 - 36.0 g/dL   RDW 15.9 (H) 11.5 - 15.5 %   Platelets 241  150 - 400 K/uL   nRBC 0.0 0.0 - 0.2 %  Comprehensive metabolic panel  Result Value Ref Range   Sodium 138 135 - 145 mmol/L   Potassium 3.7 3.5 - 5.1 mmol/L   Chloride 104 98 - 111 mmol/L   CO2 26 22 - 32 mmol/L   Glucose, Bld 317 (H) 70 - 99 mg/dL   BUN 8 8 - 23 mg/dL   Creatinine, Ser 0.78 0.44 - 1.00 mg/dL   Calcium 8.0 (L) 8.9 - 10.3 mg/dL   Total Protein 5.4 (L) 6.5 - 8.1 g/dL   Albumin 2.1 (L) 3.5 - 5.0 g/dL   AST 33 15 - 41 U/L   ALT 17 0 - 44 U/L   Alkaline Phosphatase 72 38 - 126 U/L   Total Bilirubin 0.5 0.3 - 1.2 mg/dL   GFR, Estimated >60 >60 mL/min   Anion gap 8 5 - 15  POC occult blood, ED  Result Value Ref Range   Fecal Occult Bld POSITIVE (A) NEGATIVE  Sample to Blood Bank  Result Value Ref Range   Blood Bank Specimen SAMPLE AVAILABLE FOR TESTING    Sample Expiration      01/11/2021,2359 Performed at [facility], [address], [place], [place] [address]    CT ABDOMEN PELVIS WO CONTRAST  Result Date: 01/07/2021 CLINICAL DATA:  Possible abscess and PEG tube obstruction. EXAM: CT ABDOMEN AND PELVIS WITHOUT CONTRAST TECHNIQUE: Multidetector CT imaging of the abdomen and pelvis was performed following the standard protocol without IV contrast. COMPARISON:  Chest radiograph 01/07/2021 FINDINGS: Lower chest: Small bilateral pleural effusions. Consolidation and patchy airspace infiltration in both lower lungs, greater on the left. Changes likely to represent multifocal pneumonia. Hepatobiliary: No focal liver abnormality is seen. No gallstones, gallbladder wall thickening, or biliary dilatation. Pancreas: Unremarkable. No pancreatic ductal dilatation or surrounding inflammatory changes. Spleen: Normal in size without  focal abnormality. Adrenals/Urinary Tract: Adrenal glands are unremarkable. Kidneys are normal, without renal calculi, focal lesion, or hydronephrosis. Bladder is unremarkable. Stomach/Bowel: Stomach, small bowel, and colon are not abnormally distended.  Percutaneous gastrostomy tube inserted along the distal greater curvature of the stomach. The stomach is decompressed. The colon is diffusely stool-filled with prominent stool in the rectum. Changes likely to represent constipation. No wall thickening or inflammatory changes appreciated. Surgical anastomosis at the rectosigmoid junction. Appendix is not identified. Vascular/Lymphatic: Aortic atherosclerosis. No enlarged abdominal or pelvic lymph nodes. Reproductive: Uterus and bilateral adnexa are unremarkable. Other: No free air or free fluid in the abdomen. Small midline abdominal wall hernias anteriorly containing fat. Ventral hernia to the right of midline at the level of the umbilicus contains a short segment small bowel loop but without evidence of proximal obstruction. No loculated abdominal collections identified. Musculoskeletal: Degenerative changes in the spine. No destructive bone lesions. IMPRESSION: 1. Small bilateral pleural effusions with consolidation and patchy airspace infiltration in both lower lungs, greater on the left. Changes likely to represent multifocal pneumonia. 2. Percutaneous gastrostomy tube inserted along the distal greater curvature of the stomach. The stomach is decompressed. 3. Diffusely stool-filled colon with prominent stool in the rectum. Changes likely to represent constipation. 4. Small midline abdominal wall hernias containing fat. Ventral hernia to the right of midline at the level of the umbilicus contains a short segment small bowel loop but without evidence of proximal obstruction. 5. Aortic atherosclerosis. Aortic Atherosclerosis (ICD10-I70.0). Electronically Signed   By: Lucienne Capers M.D.   On: 01/07/2021 23:24   CT Head Wo Contrast  Result Date: 01/07/2021 CLINICAL DATA:  [place] EXAM: CT HEAD WITHOUT CONTRAST TECHNIQUE: Contiguous axial images were obtained from the base of the skull through the vertex without intravenous contrast. COMPARISON:  None.  FINDINGS: Brain: Normal anatomic configuration. Parenchymal volume loss is commensurate with the patient's age. Mild periventricular white matter changes are present likely reflecting the sequela of small vessel ischemia. Remote lacunar infarct is noted within the left subinsular white matter. No abnormal intra or extra-axial mass lesion or fluid collection. No abnormal mass effect or midline shift. No evidence of acute intracranial hemorrhage or infarct. Ventricular size is normal. Cerebellum unremarkable. Vascular: No asymmetric hyperdense vasculature at the skull base. Skull: Intact Sinuses/Orbits: Paranasal sinuses are clear. Orbits are unremarkable. Other: Mastoid air cells and middle ear cavities are clear. IMPRESSION: No acute intracranial abnormality. Mild senescent change.  Remote left subinsular lacunar infarct. Electronically Signed   By: Fidela Salisbury MD   On: 01/07/2021 02:45   DG Chest Port 1 View  Result Date: 01/07/2021 CLINICAL DATA:  Questionable sepsis.  Unresponsive patient. EXAM: PORTABLE CHEST 1 VIEW COMPARISON:  12/21/2020 FINDINGS: Tracheostomy tube. Mild cardiac enlargement. Right perihilar opacity likely representing pneumonia but follow-up to resolution recommended to exclude underlying mass lesion. Air bronchograms behind the left heart suggesting consolidation or atelectasis. Probable small left pleural effusion. Calcification of the aorta. IMPRESSION: Right perihilar opacity likely representing pneumonia but follow-up to resolution recommended to exclude underlying mass lesion. Consolidation or atelectasis in the left lung base with small left pleural effusion. Electronically Signed   By: Lucienne Capers M.D.   On: 01/07/2021 02:14   DG CHEST PORT 1 VIEW  Result Date: 12/21/2020 CLINICAL DATA:  75 year old female with history of hypoxia. EXAM: PORTABLE CHEST 1 VIEW COMPARISON:  No priors. FINDINGS: Tracheostomy tube in position with tip terminating 6.5 cm above the carina. Lung  volumes are low. Bibasilar opacities (left  greater than right) which may reflect areas of atelectasis and/or consolidation. Small left pleural effusion. No right pleural effusion. No pneumothorax. No evidence of pulmonary edema. Heart size is upper limits of normal. Upper mediastinal contours are within normal limits allowing for patient rotation to the left. Atherosclerotic calcifications in the thoracic aorta. IMPRESSION: 1. Low lung volumes with bibasilar areas of atelectasis and/or consolidation (left greater than right) and small left pleural effusion. 2. Aortic atherosclerosis. Electronically Signed   By: Vinnie Langton M.D.   On: 12/21/2020 08:01    EKG EKG Interpretation  Date/Time:  Friday January 10 2021 08:42:12 EDT Ventricular Rate:  104 PR Interval:  167 QRS Duration: 73 QT Interval:  391 QTC Calculation: 515 R Axis:   73 Text Interpretation: Sinus tachycardia Non-specific ST-t changes Confirmed by Lajean Saver [id number]) on 01/10/2021 9:10:26 AM   Radiology No results found.  Procedures Procedures   Medications Ordered in ED Medications - No data to display  ED Course  I have reviewed the triage vital signs and the nursing notes.  Pertinent labs & imaging results that were available during my care of the patient were reviewed by me and considered in my medical decision making (see chart for details).    MDM Rules/Calculators/A&P                         Iv ns. Continuous pulse ox and cardiac monitoring.   Reviewed nursing notes and prior charts for additional history.   Report of very high bp at facility, but [facility] initial bp normal.  ?possible mucous plug or other unwitnessed event earlier - currently appears c/w baseline described at recent d/c from hospital.   Slight wheezing - albuterol treatment.   Labs sent.   Labs reviewed/interpreted by me - hgb 8, low, but c/w prior. Stool is yellowish/light brown, but does test heme positive.   Recheck pt,  breathing comfortably, bp normal. No apparent pain or discomfort.   Recheck, comfortable, no pain or discomfort. Vitals normal. Pt w many comorbidities and serious chronic medical issues, but appears overall stable for d/c back to [facility].      Final Clinical Impression(s) / ED Diagnoses Final diagnoses:  None    Rx / DC Orders ED Discharge Orders    None       Lajean Saver, MD 01/10/21 1436

## 2021-01-10 NOTE — ED Triage Notes (Signed)
Pt bib [facility] from [facility]. Per [facility], pt had an episode of apnea and hypertension crisis this morning. Bp 250/125. En route pt's bp went down to 110/60 without any giving medications. Pt is A&O *4, No Respiratory distress on vent.

## 2021-01-10 NOTE — Discharge Instructions (Addendum)
It was our pleasure to provide your ER care today - we hope that you feel better.  From today's labs, your blood count, hemoglobin is 8 c/w your recent prior results. Your stool, although normal in color, does test weakly heme positive - follow up with your doctor in the coming week for recheck.  Your blood sugar is also high today - follow up with your doctor.   Return to ER if worse, new symptoms, high fevers, increased trouble breathing, or other concern.

## 2021-01-12 LAB — CULTURE, BLOOD (ROUTINE X 2)
Culture: NO GROWTH
Culture: NO GROWTH
Special Requests: ADEQUATE
Special Requests: ADEQUATE

## 2021-01-14 ENCOUNTER — Inpatient Hospital Stay (HOSPITAL_COMMUNITY): Payer: Medicare Other

## 2021-01-14 ENCOUNTER — Other Ambulatory Visit: Payer: Self-pay

## 2021-01-14 ENCOUNTER — Emergency Department (HOSPITAL_COMMUNITY): Payer: Medicare Other

## 2021-01-14 ENCOUNTER — Inpatient Hospital Stay (HOSPITAL_COMMUNITY)
Admission: EM | Admit: 2021-01-14 | Discharge: 2021-01-20 | DRG: 870 | Disposition: A | Discharge status: Skilled Nursing Facility | Payer: Medicare Other | Source: Skilled Nursing Facility | Attending: Pulmonary Disease | Admitting: Pulmonary Disease

## 2021-01-14 DIAGNOSIS — T380X5A Adverse effect of glucocorticoids and synthetic analogues, initial encounter: Secondary | ICD-10-CM | POA: Diagnosis present

## 2021-01-14 DIAGNOSIS — N182 Chronic kidney disease, stage 2 (mild): Secondary | ICD-10-CM | POA: Diagnosis present

## 2021-01-14 DIAGNOSIS — Z794 Long term (current) use of insulin: Secondary | ICD-10-CM

## 2021-01-14 DIAGNOSIS — J441 Chronic obstructive pulmonary disease with (acute) exacerbation: Secondary | ICD-10-CM | POA: Diagnosis present

## 2021-01-14 DIAGNOSIS — I5032 Chronic diastolic (congestive) heart failure: Secondary | ICD-10-CM | POA: Diagnosis present

## 2021-01-14 DIAGNOSIS — N179 Acute kidney failure, unspecified: Secondary | ICD-10-CM | POA: Diagnosis present

## 2021-01-14 DIAGNOSIS — J9601 Acute respiratory failure with hypoxia: Secondary | ICD-10-CM

## 2021-01-14 DIAGNOSIS — E1165 Type 2 diabetes mellitus with hyperglycemia: Secondary | ICD-10-CM | POA: Diagnosis present

## 2021-01-14 DIAGNOSIS — J80 Acute respiratory distress syndrome: Secondary | ICD-10-CM | POA: Diagnosis present

## 2021-01-14 DIAGNOSIS — F319 Bipolar disorder, unspecified: Secondary | ICD-10-CM | POA: Diagnosis present

## 2021-01-14 DIAGNOSIS — E872 Acidosis: Secondary | ICD-10-CM | POA: Diagnosis present

## 2021-01-14 DIAGNOSIS — J189 Pneumonia, unspecified organism: Secondary | ICD-10-CM | POA: Diagnosis present

## 2021-01-14 DIAGNOSIS — R739 Hyperglycemia, unspecified: Secondary | ICD-10-CM | POA: Diagnosis not present

## 2021-01-14 DIAGNOSIS — D649 Anemia, unspecified: Secondary | ICD-10-CM | POA: Diagnosis not present

## 2021-01-14 DIAGNOSIS — R6521 Severe sepsis with septic shock: Secondary | ICD-10-CM | POA: Diagnosis present

## 2021-01-14 DIAGNOSIS — Z8701 Personal history of pneumonia (recurrent): Secondary | ICD-10-CM

## 2021-01-14 DIAGNOSIS — E0590 Thyrotoxicosis, unspecified without thyrotoxic crisis or storm: Secondary | ICD-10-CM | POA: Diagnosis present

## 2021-01-14 DIAGNOSIS — J9621 Acute and chronic respiratory failure with hypoxia: Secondary | ICD-10-CM | POA: Diagnosis not present

## 2021-01-14 DIAGNOSIS — Z7901 Long term (current) use of anticoagulants: Secondary | ICD-10-CM

## 2021-01-14 DIAGNOSIS — Z20822 Contact with and (suspected) exposure to covid-19: Secondary | ICD-10-CM | POA: Diagnosis present

## 2021-01-14 DIAGNOSIS — J159 Unspecified bacterial pneumonia: Secondary | ICD-10-CM | POA: Diagnosis not present

## 2021-01-14 DIAGNOSIS — J95851 Ventilator associated pneumonia: Secondary | ICD-10-CM | POA: Diagnosis present

## 2021-01-14 DIAGNOSIS — G20 Parkinson's disease: Secondary | ICD-10-CM | POA: Diagnosis present

## 2021-01-14 DIAGNOSIS — A419 Sepsis, unspecified organism: Secondary | ICD-10-CM | POA: Diagnosis present

## 2021-01-14 DIAGNOSIS — J440 Chronic obstructive pulmonary disease with acute lower respiratory infection: Secondary | ICD-10-CM | POA: Diagnosis present

## 2021-01-14 DIAGNOSIS — Z9911 Dependence on respirator [ventilator] status: Secondary | ICD-10-CM | POA: Diagnosis not present

## 2021-01-14 DIAGNOSIS — Z79899 Other long term (current) drug therapy: Secondary | ICD-10-CM

## 2021-01-14 DIAGNOSIS — E1122 Type 2 diabetes mellitus with diabetic chronic kidney disease: Secondary | ICD-10-CM | POA: Diagnosis present

## 2021-01-14 DIAGNOSIS — E876 Hypokalemia: Secondary | ICD-10-CM | POA: Diagnosis not present

## 2021-01-14 DIAGNOSIS — J9690 Respiratory failure, unspecified, unspecified whether with hypoxia or hypercapnia: Secondary | ICD-10-CM

## 2021-01-14 DIAGNOSIS — I509 Heart failure, unspecified: Secondary | ICD-10-CM

## 2021-01-14 DIAGNOSIS — R069 Unspecified abnormalities of breathing: Secondary | ICD-10-CM

## 2021-01-14 DIAGNOSIS — D6489 Other specified anemias: Secondary | ICD-10-CM | POA: Diagnosis present

## 2021-01-14 DIAGNOSIS — I480 Paroxysmal atrial fibrillation: Secondary | ICD-10-CM | POA: Diagnosis present

## 2021-01-14 DIAGNOSIS — J9622 Acute and chronic respiratory failure with hypercapnia: Secondary | ICD-10-CM | POA: Diagnosis not present

## 2021-01-14 LAB — CBC WITH DIFFERENTIAL/PLATELET
Abs Immature Granulocytes: 0.17 10*3/uL — ABNORMAL HIGH (ref 0.00–0.07)
Basophils Absolute: 0 10*3/uL (ref 0.0–0.1)
Basophils Relative: 0 %
Eosinophils Absolute: 0.1 10*3/uL (ref 0.0–0.5)
Eosinophils Relative: 0 %
HCT: 24.9 % — ABNORMAL LOW (ref 36.0–46.0)
Hemoglobin: 7.4 g/dL — ABNORMAL LOW (ref 12.0–15.0)
Immature Granulocytes: 1 %
Lymphocytes Relative: 5 %
Lymphs Abs: 0.6 10*3/uL — ABNORMAL LOW (ref 0.7–4.0)
MCH: 29 pg (ref 26.0–34.0)
MCHC: 29.7 g/dL — ABNORMAL LOW (ref 30.0–36.0)
MCV: 97.6 fL (ref 80.0–100.0)
Monocytes Absolute: 0.6 10*3/uL (ref 0.1–1.0)
Monocytes Relative: 5 %
Neutro Abs: 10.7 10*3/uL — ABNORMAL HIGH (ref 1.7–7.7)
Neutrophils Relative %: 89 %
Platelets: 384 10*3/uL (ref 150–400)
RBC: 2.55 MIL/uL — ABNORMAL LOW (ref 3.87–5.11)
RDW: 16.2 % — ABNORMAL HIGH (ref 11.5–15.5)
WBC: 12.2 10*3/uL — ABNORMAL HIGH (ref 4.0–10.5)
nRBC: 0.2 % (ref 0.0–0.2)

## 2021-01-14 LAB — POCT I-STAT 7, (LYTES, BLD GAS, ICA,H+H)
Acid-Base Excess: 16 mmol/L — ABNORMAL HIGH (ref 0.0–2.0)
Bicarbonate: 42 mmol/L — ABNORMAL HIGH (ref 20.0–28.0)
Calcium, Ion: 1.14 mmol/L — ABNORMAL LOW (ref 1.15–1.40)
HCT: 26 % — ABNORMAL LOW (ref 36.0–46.0)
Hemoglobin: 8.8 g/dL — ABNORMAL LOW (ref 12.0–15.0)
O2 Saturation: 100 %
Patient temperature: 99.2
Potassium: 4.3 mmol/L (ref 3.5–5.1)
Sodium: 137 mmol/L (ref 135–145)
TCO2: 44 mmol/L — ABNORMAL HIGH (ref 22–32)
pCO2 arterial: 62.7 mmHg — ABNORMAL HIGH (ref 32.0–48.0)
pH, Arterial: 7.435 (ref 7.350–7.450)
pO2, Arterial: 209 mmHg — ABNORMAL HIGH (ref 83.0–108.0)

## 2021-01-14 LAB — OCCULT BLOOD GASTRIC / DUODENUM (SPECIMEN CUP): Occult Blood, Gastric: NEGATIVE

## 2021-01-14 LAB — COMPREHENSIVE METABOLIC PANEL
ALT: 36 U/L (ref 0–44)
AST: 57 U/L — ABNORMAL HIGH (ref 15–41)
Albumin: 1.8 g/dL — ABNORMAL LOW (ref 3.5–5.0)
Alkaline Phosphatase: 171 U/L — ABNORMAL HIGH (ref 38–126)
Anion gap: 8 (ref 5–15)
BUN: 31 mg/dL — ABNORMAL HIGH (ref 8–23)
CO2: 39 mmol/L — ABNORMAL HIGH (ref 22–32)
Calcium: 8.8 mg/dL — ABNORMAL LOW (ref 8.9–10.3)
Chloride: 88 mmol/L — ABNORMAL LOW (ref 98–111)
Creatinine, Ser: 1.04 mg/dL — ABNORMAL HIGH (ref 0.44–1.00)
GFR, Estimated: 56 mL/min — ABNORMAL LOW (ref >60)
Glucose, Bld: 435 mg/dL — ABNORMAL HIGH (ref 70–99)
Potassium: 4.4 mmol/L (ref 3.5–5.1)
Sodium: 135 mmol/L (ref 135–145)
Total Bilirubin: 0.4 mg/dL (ref 0.3–1.2)
Total Protein: 6.3 g/dL — ABNORMAL LOW (ref 6.5–8.1)

## 2021-01-14 LAB — URINALYSIS, ROUTINE W REFLEX MICROSCOPIC
Bacteria, UA: NONE SEEN
Bilirubin Urine: NEGATIVE
Glucose, UA: >=500 mg/dL — AB
Ketones, ur: 5 mg/dL — AB
Leukocytes,Ua: NEGATIVE
Nitrite: NEGATIVE
Protein, ur: 100 mg/dL — AB
RBC / HPF: >50 RBC/hpf — ABNORMAL HIGH (ref 0–5)
Specific Gravity, Urine: 1.017 (ref 1.005–1.030)
pH: 5 (ref 5.0–8.0)

## 2021-01-14 LAB — TSH
TSH: 1.461 u[IU]/mL (ref 0.350–4.500)
TSH: 3.844 u[IU]/mL (ref 0.350–4.500)

## 2021-01-14 LAB — I-STAT VENOUS BLOOD GAS, ED
Acid-Base Excess: 19 mmol/L — ABNORMAL HIGH (ref 0.0–2.0)
Bicarbonate: 44.9 mmol/L — ABNORMAL HIGH (ref 20.0–28.0)
Calcium, Ion: 1.08 mmol/L — ABNORMAL LOW (ref 1.15–1.40)
HCT: 25 % — ABNORMAL LOW (ref 36.0–46.0)
Hemoglobin: 8.5 g/dL — ABNORMAL LOW (ref 12.0–15.0)
O2 Saturation: 99 %
Potassium: 4.4 mmol/L (ref 3.5–5.1)
Sodium: 136 mmol/L (ref 135–145)
TCO2: 47 mmol/L — ABNORMAL HIGH (ref 22–32)
pCO2, Ven: 64.1 mmHg — ABNORMAL HIGH (ref 44.0–60.0)
pH, Ven: 7.453 — ABNORMAL HIGH (ref 7.250–7.430)
pO2, Ven: 164 mmHg — ABNORMAL HIGH (ref 32.0–45.0)

## 2021-01-14 LAB — RESP PANEL BY RT-PCR (FLU A&B, COVID) ARPGX2
Influenza A by PCR: NEGATIVE
Influenza B by PCR: NEGATIVE
SARS Coronavirus 2 by RT PCR: NEGATIVE

## 2021-01-14 LAB — LACTIC ACID, PLASMA
Lactic Acid, Venous: 2.9 mmol/L (ref 0.5–1.9)
Lactic Acid, Venous: 2.5 mmol/L (ref 0.5–1.9)
Lactic Acid, Venous: 5.5 mmol/L (ref 0.5–1.9)

## 2021-01-14 LAB — GLUCOSE, CAPILLARY
Glucose-Capillary: 186 mg/dL — ABNORMAL HIGH (ref 70–99)
Glucose-Capillary: 228 mg/dL — ABNORMAL HIGH (ref 70–99)

## 2021-01-14 LAB — BRAIN NATRIURETIC PEPTIDE: B Natriuretic Peptide: 706.5 pg/mL — ABNORMAL HIGH (ref 0.0–100.0)

## 2021-01-14 LAB — AMMONIA: Ammonia: 50 umol/L — ABNORMAL HIGH (ref 9–35)

## 2021-01-14 LAB — TROPONIN I (HIGH SENSITIVITY): Troponin I (High Sensitivity): 22 ng/L — ABNORMAL HIGH (ref <18)

## 2021-01-14 MED ORDER — LEVALBUTEROL HCL 0.63 MG/3ML IN NEBU
0.6300 mg | INHALATION_SOLUTION | Freq: Four times a day (QID) | RESPIRATORY_TRACT | Status: DC
  Administered 2021-01-14 – 2021-01-15 (×3): 0.63 mg via RESPIRATORY_TRACT
  Filled 2021-01-14 (×3): qty 3

## 2021-01-14 MED ORDER — CHLORHEXIDINE GLUCONATE CLOTH 2 % EX PADS
6.0000 | MEDICATED_PAD | Freq: Every day | CUTANEOUS | Status: DC
  Administered 2021-01-15 – 2021-01-20 (×6): 6 via TOPICAL

## 2021-01-14 MED ORDER — FENTANYL CITRATE (PF) 100 MCG/2ML IJ SOLN
25.0000 ug | INTRAMUSCULAR | Status: DC | PRN
  Administered 2021-01-14: 50 ug via INTRAVENOUS
  Filled 2021-01-14: qty 2

## 2021-01-14 MED ORDER — SODIUM CHLORIDE 0.9 % IV SOLN
2.0000 g | Freq: Two times a day (BID) | INTRAVENOUS | Status: DC
  Administered 2021-01-15 (×2): 2 g via INTRAVENOUS
  Filled 2021-01-14 (×2): qty 2

## 2021-01-14 MED ORDER — SODIUM CHLORIDE 0.9 % IV SOLN
2.0000 g | Freq: Once | INTRAVENOUS | Status: AC
  Administered 2021-01-14: 2 g via INTRAVENOUS
  Filled 2021-01-14: qty 2

## 2021-01-14 MED ORDER — OLANZAPINE 5 MG PO TABS
10.0000 mg | ORAL_TABLET | Freq: Every day | ORAL | Status: DC
  Administered 2021-01-14 – 2021-01-19 (×6): 10 mg
  Filled 2021-01-14 (×7): qty 2

## 2021-01-14 MED ORDER — LACTATED RINGERS IV BOLUS: 1000.0000 mL | Freq: Once | INTRAVENOUS | Status: DC

## 2021-01-14 MED ORDER — MEMANTINE HCL 10 MG PO TABS
5.0000 mg | ORAL_TABLET | Freq: Two times a day (BID) | ORAL | Status: DC
  Administered 2021-01-14 – 2021-01-20 (×12): 5 mg
  Filled 2021-01-14 (×13): qty 0.5

## 2021-01-14 MED ORDER — AMIODARONE HCL 200 MG PO TABS
200.0000 mg | ORAL_TABLET | Freq: Every day | ORAL | Status: DC
  Administered 2021-01-15 – 2021-01-20 (×6): 200 mg
  Filled 2021-01-14 (×6): qty 1

## 2021-01-14 MED ORDER — IOHEXOL 350 MG/ML SOLN
50.0000 mL | Freq: Once | INTRAVENOUS | Status: AC | PRN
  Administered 2021-01-14: 50 mL via INTRAVENOUS

## 2021-01-14 MED ORDER — MIDAZOLAM HCL 2 MG/2ML IJ SOLN: 1.0000 mg | INTRAMUSCULAR | Status: DC | PRN

## 2021-01-14 MED ORDER — POLYETHYLENE GLYCOL 3350 17 G PO PACK
17.0000 g | PACK | Freq: Every day | ORAL | Status: DC
  Administered 2021-01-15 – 2021-01-16 (×2): 17 g
  Filled 2021-01-14 (×2): qty 1

## 2021-01-14 MED ORDER — LACTATED RINGERS IV BOLUS
30.0000 mL/kg | Freq: Once | INTRAVENOUS | Status: AC
  Administered 2021-01-14: 2835 mL via INTRAVENOUS

## 2021-01-14 MED ORDER — INSULIN GLARGINE 100 UNIT/ML ~~LOC~~ SOLN
22.0000 [IU] | Freq: Every day | SUBCUTANEOUS | Status: DC
  Administered 2021-01-14 – 2021-01-15 (×2): 22 [IU] via SUBCUTANEOUS
  Filled 2021-01-14 (×6): qty 0.22

## 2021-01-14 MED ORDER — INSULIN ASPART 100 UNIT/ML IJ SOLN
0.0000 [IU] | INTRAMUSCULAR | Status: DC
  Administered 2021-01-14: 4 [IU] via SUBCUTANEOUS
  Administered 2021-01-14: 7 [IU] via SUBCUTANEOUS
  Administered 2021-01-15 (×2): 3 [IU] via SUBCUTANEOUS
  Administered 2021-01-15: 7 [IU] via SUBCUTANEOUS
  Administered 2021-01-15: 3 [IU] via SUBCUTANEOUS
  Administered 2021-01-15 – 2021-01-16 (×2): 11 [IU] via SUBCUTANEOUS
  Administered 2021-01-16: 15 [IU] via SUBCUTANEOUS
  Administered 2021-01-16 (×3): 11 [IU] via SUBCUTANEOUS
  Administered 2021-01-16: 7 [IU] via SUBCUTANEOUS
  Administered 2021-01-16: 11 [IU] via SUBCUTANEOUS
  Administered 2021-01-17: 7 [IU] via SUBCUTANEOUS
  Administered 2021-01-17: 11 [IU] via SUBCUTANEOUS
  Administered 2021-01-17: 4 [IU] via SUBCUTANEOUS
  Administered 2021-01-17: 15 [IU] via SUBCUTANEOUS
  Administered 2021-01-18: 7 [IU] via SUBCUTANEOUS
  Administered 2021-01-18: 4 [IU] via SUBCUTANEOUS
  Administered 2021-01-18: 15 [IU] via SUBCUTANEOUS
  Administered 2021-01-18: 7 [IU] via SUBCUTANEOUS
  Administered 2021-01-18: 11 [IU] via SUBCUTANEOUS
  Administered 2021-01-19: 15 [IU] via SUBCUTANEOUS
  Administered 2021-01-19 (×2): 7 [IU] via SUBCUTANEOUS
  Administered 2021-01-19: 11 [IU] via SUBCUTANEOUS
  Administered 2021-01-19 – 2021-01-20 (×3): 7 [IU] via SUBCUTANEOUS
  Administered 2021-01-20 (×2): 11 [IU] via SUBCUTANEOUS

## 2021-01-14 MED ORDER — PROPYLTHIOURACIL 50 MG PO TABS
50.0000 mg | ORAL_TABLET | Freq: Two times a day (BID) | ORAL | Status: DC
  Administered 2021-01-14 – 2021-01-20 (×12): 50 mg
  Filled 2021-01-14 (×13): qty 1

## 2021-01-14 MED ORDER — PANTOPRAZOLE SODIUM 40 MG IV SOLR
40.0000 mg | Freq: Every day | INTRAVENOUS | Status: DC
  Administered 2021-01-14: 40 mg via INTRAVENOUS
  Filled 2021-01-14: qty 40

## 2021-01-14 MED ORDER — FLUOXETINE HCL 20 MG PO TABS: 10.0000 mg | ORAL_TABLET | Freq: Every day | ORAL | Status: DC

## 2021-01-14 MED ORDER — VANCOMYCIN HCL 1500 MG/300ML IV SOLN
1500.0000 mg | Freq: Once | INTRAVENOUS | Status: AC
  Administered 2021-01-14: 1500 mg via INTRAVENOUS
  Filled 2021-01-14: qty 300

## 2021-01-14 MED ORDER — DOCUSATE SODIUM 100 MG PO CAPS: 100.0000 mg | ORAL_CAPSULE | Freq: Two times a day (BID) | ORAL | Status: DC | PRN

## 2021-01-14 MED ORDER — DEXMEDETOMIDINE HCL IN NACL 400 MCG/100ML IV SOLN
0.0000 ug/kg/h | INTRAVENOUS | Status: DC
  Administered 2021-01-14: 0.4 ug/kg/h via INTRAVENOUS
  Filled 2021-01-14 (×3): qty 100

## 2021-01-14 MED ORDER — FENTANYL CITRATE (PF) 100 MCG/2ML IJ SOLN: 25.0000 ug | INTRAMUSCULAR | Status: DC | PRN

## 2021-01-14 MED ORDER — VANCOMYCIN HCL 750 MG/150ML IV SOLN
750.0000 mg | INTRAVENOUS | Status: DC
  Administered 2021-01-15: 750 mg via INTRAVENOUS
  Filled 2021-01-14: qty 150

## 2021-01-14 MED ORDER — HEPARIN (PORCINE) 25000 UT/250ML-% IV SOLN
1200.0000 [IU]/h | INTRAVENOUS | Status: DC
  Administered 2021-01-14: 1100 [IU]/h via INTRAVENOUS
  Filled 2021-01-14: qty 250

## 2021-01-14 MED ORDER — MIDAZOLAM HCL 2 MG/2ML IJ SOLN: 4.0000 mg | Freq: Once | INTRAMUSCULAR | Status: DC

## 2021-01-14 MED ORDER — CARBIDOPA-LEVODOPA 25-100 MG PO TABS
1.0000 | ORAL_TABLET | Freq: Four times a day (QID) | ORAL | Status: DC
  Administered 2021-01-14 – 2021-01-20 (×23): 1
  Filled 2021-01-14 (×25): qty 1

## 2021-01-14 MED ORDER — IPRATROPIUM-ALBUTEROL 0.5-2.5 (3) MG/3ML IN SOLN
3.0000 mL | Freq: Once | RESPIRATORY_TRACT | Status: AC
  Administered 2021-01-14: 3 mL via RESPIRATORY_TRACT
  Filled 2021-01-14: qty 3

## 2021-01-14 MED ORDER — FLUOXETINE HCL 20 MG/5ML PO SOLN
10.0000 mg | Freq: Every day | ORAL | Status: DC
  Administered 2021-01-15 – 2021-01-20 (×6): 10 mg
  Filled 2021-01-14 (×6): qty 5

## 2021-01-14 MED ORDER — FENTANYL CITRATE (PF) 100 MCG/2ML IJ SOLN: 100.0000 ug | INTRAMUSCULAR | Status: DC | PRN

## 2021-01-14 MED ORDER — DIVALPROEX SODIUM 125 MG PO CSDR
500.0000 mg | DELAYED_RELEASE_CAPSULE | Freq: Two times a day (BID) | ORAL | Status: DC
  Administered 2021-01-15: 500 mg via ORAL
  Filled 2021-01-14: qty 4

## 2021-01-14 MED ORDER — IPRATROPIUM BROMIDE 0.02 % IN SOLN
0.5000 mg | Freq: Four times a day (QID) | RESPIRATORY_TRACT | Status: DC
  Administered 2021-01-14 – 2021-01-15 (×3): 0.5 mg via RESPIRATORY_TRACT
  Filled 2021-01-14 (×3): qty 2.5

## 2021-01-14 MED ORDER — MIDAZOLAM HCL (PF) 5 MG/ML IJ SOLN: 4.0000 mg | Freq: Once | INTRAMUSCULAR | Status: DC

## 2021-01-14 MED ORDER — DOCUSATE SODIUM 50 MG/5ML PO LIQD
100.0000 mg | Freq: Two times a day (BID) | ORAL | Status: DC
  Administered 2021-01-14 – 2021-01-16 (×5): 100 mg
  Filled 2021-01-14 (×5): qty 10

## 2021-01-14 MED ORDER — POLYETHYLENE GLYCOL 3350 17 G PO PACK: 17.0000 g | PACK | Freq: Every day | ORAL | Status: DC | PRN

## 2021-01-14 MED ORDER — LACTATED RINGERS IV BOLUS
500.0000 mL | Freq: Once | INTRAVENOUS | Status: AC
  Administered 2021-01-14: 500 mL via INTRAVENOUS

## 2021-01-14 NOTE — H&P (Signed)
NAME:  Lindsey Collins, MRN:  [id number], DOB:  08-20-1946, LOS: 0 ADMISSION DATE:  01/14/2021, CONSULTATION DATE:  5/3 REFERRING MD:  Dr. Rex Kras, CHIEF COMPLAINT:  Unresponsiveness   History of Present Illness:  75 yo F with PMH below which is significant for chronic vent dependent with trach, parkinson's, T2DM, CKD, HFpEF, Afib on eliquis and amio CC of multiple episodes of unresponsiveness. Resides at [facility]. Has had trach for past 2 years for MRSA pneumonia. March 2022 patient admitted for serratia pneumonia and discharged with Cefepime. Recently admitted to [facility] on 01/07/2021 with serratia pneumonia still present. Another course of Cefepime ordered and patient was later discharged 01/09/2021. While at [facility] patient was continued on Cefepime, however, patient was still not doing well. 01/12/2021 patient was requiring increased FiO2 requirement and hypoxic.   Patient presents 01/14/2021 to [facility] with multiple periods of unresponsiveness earlier this morning. Patient appears critically ill on home ventilator settings. Patient remains tachycardic in the 140s. CTA negative for PE, shows multifocal pneumonia versus asymmetric pulmonary edema. CXR shows several areas of edema vs. Infiltrate; worse in RUL. Pertinent Labs show Glucose 435, Hb 7.4, BNP 706, Lactic acid 2.9. 1 L of fluids total given to patient. Fluids on hold due to elevated BNP. Vanc and Cefepime started.  Pertinent  Medical History   has a past medical history of Acute on chronic respiratory failure with hypoxia ([place]), ARDS (adult respiratory distress syndrome) ([place]), Chronic kidney disease, stage II (mild), MRSA (methicillin resistant staphylococcus aureus) pneumonia ([place]), and Obstructive chronic bronchitis without exacerbation ([place]).  Significant Hospital Events: Including procedures, antibiotic start and stop dates in addition to other pertinent events   . 4/26-4/28 Admitted/Discharged from [facility] for Serratia pneumonia. Sent to  [facility] on Cefepime. . 5/3 Admitted to [facility] for periods of unresponsiveness and ARDS. Trach changed to 5 mm cuffed proximal XLT shiley.   Interim History / Subjective:    Objective   Blood pressure 120/72, pulse (!) 147, temperature 98.1 F (36.7 C), temperature source Rectal, resp. rate (!) 24, SpO2 100 %.    Vent Mode: PRVC FiO2 (%):  [40 %-80 %] 55 % Set Rate:  [20 bmp] 20 bmp Vt Set:  [440 mL] 440 mL PEEP:  [5 cmH20-10 cmH20] 10 cmH20 Plateau Pressure:  [30 cmH20-32 cmH20] 32 cmH20  No intake or output data in the 24 hours ending 01/14/21 1645 There were no vitals filed for this visit.  Examination: General: ill appearing; chronic vent dependent trach patient HEENT: MM pink/moist Neuro: alert and oriented x4 CV: s1s2, no m/r/g, tachy 140s PULM: BS coarse throughout; On home vent settings: PRVC 65% 10 PEEP. Ppeak 38/Pplat 33 GI: soft, bsx4 active  Extremities: warm/dry, 2+ pitting edema up to the knee BLE Skin: no rashes or lesions   Labs/imaging that I havepersonally reviewed  (right click and "Reselect all SmartList Selections" daily)    Resolved Hospital Problem list     Assessment & Plan:   Presumed ARDS (ABG pending) Chronic hypercarbic respiratory failure: (Vent dependent at [facility]; Trach changed on 01/14/2021 to proximal XLT) Serratia Pneumonia: culture on 4/26, on Cefepime since that time, clinically presenting with pneumonia. - Sputum culture ordered - Place on 6-8 cc/kg; wean FiO2 for sats >90% - Atrovent/Xopenex ordered  - ABG ordered - PRN Fentanyl, Versed, Precedex: RASS -1,-2 - Continue Cefepime and Vanc - CXR in morning  Hyperglycemia Hx of DM2 - SSI  - CBG q4 - restart home basal insulin at half dose - start  tube feeds  Tachycardia Paroxsymal Afib: (on Eliquis at Lt[facility]) Elevated BNP -  Checking CBC, BMP, phosph, mag - Heparin infusion instead of Eliquis in case procedures are needed. - ordered home amiodarone  Anemia -continue to trend  CBC   Hx of Mood disorder, Bipolar, Parkinsons - will restart home med depakote, Carbidopa-levodopa, prozac, memantine, olanzapine  Questionable Hyperthyroidism (Home med PTU) - Resumed home PTU - will check a TSH    Best practice (right click and "Reselect all SmartList Selections" daily)  Diet:  Tube Feed  Pain/Anxiety/Delirium protocol (if indicated): Yes (RASS goal -1,-2) VAP protocol (if indicated): Yes DVT prophylaxis: SCD GI prophylaxis: PPI Glucose control:  SSI Yes and Basal insulin Yes Central venous access:  N/A Arterial line:  N/A Foley:  N/A Mobility:  bed rest  PT consulted: N/A Last date of multidisciplinary goals of care discussion Code Status:  full code Disposition: ICU  Labs   CBC: Recent Labs  Lab 01/08/21 1017 01/09/21 0437 01/10/21 0916 01/14/21 0915 01/14/21 1003  WBC 10.1 10.1 11.6* 12.2*  --   NEUTROABS  --   --   --  10.7*  --   HGB 8.1* 8.8* 8.0* 7.4* 8.5*  HCT 26.5* 28.3* 26.8* 24.9* 25.0*  MCV 96.4 96.3 99.3 97.6  --   PLT 195 221 241 384  --     Basic Metabolic Panel: Recent Labs  Lab 01/08/21 0050 01/08/21 1017 01/08/21 1745 01/09/21 0437 01/10/21 0916 01/14/21 0915 01/14/21 1003  NA 140  --   --  137 138 135 136  K 3.4*  --   --  3.3* 3.7 4.4 4.4  CL 101  --   --  101 104 88*  --   CO2 32  --   --  27 26 39*  --   GLUCOSE 127*  --   --  223* 317* 435*  --   BUN 12  --   --  12 8 31*  --   CREATININE 0.73  --   --  0.70 0.78 1.04*  --   CALCIUM 8.3*  --   --  8.0* 8.0* 8.8*  --   MG 1.4* 1.9 2.3 2.2  --   --   --   PHOS 1.7* 3.4 3.6 3.5  --   --   --    GFR: Estimated Creatinine Clearance: 52.9 mL/min (A) (by C-G formula based on SCr of 1.04 mg/dL (H)). Recent Labs  Lab 01/07/21 1754 01/08/21 1017 01/09/21 0437 01/10/21 0916 01/14/21 0915  WBC  --  10.1 10.1 11.6* 12.2*  LATICACIDVEN 1.6  --   --   --  2.9*    Liver Function Tests: Recent Labs  Lab 01/10/21 0916 01/14/21 0915  AST 33 57*  ALT 17 36   ALKPHOS 72 171*  BILITOT 0.5 0.4  PROT 5.4* 6.3*  ALBUMIN 2.1* 1.8*   No results for input(s): LIPASE, AMYLASE in the last 168 hours. Recent Labs  Lab 01/14/21 0915  AMMONIA 50*    ABG    Component Value Date/Time   PHART 7.324 (L) 01/07/2021 0326   PCO2ART 74.2 (HH) 01/07/2021 0326   PO2ART 69 (L) 01/07/2021 0326   HCO3 44.9 (H) 01/14/2021 1003   TCO2 47 (H) 01/14/2021 1003   O2SAT 99.0 01/14/2021 1003     Coagulation Profile: No results for input(s): INR, PROTIME in the last 168 hours.  Cardiac Enzymes: No results for input(s): CKTOTAL, CKMB, CKMBINDEX, TROPONINI in the last 168  hours.  HbA1C: Hgb A1c MFr Bld  Date/Time Value Ref Range Status  12/20/2020 01:17 PM 7.2 (H) 4.8 - 5.6 % Final    Comment:    (NOTE) Pre diabetes:          5.7%-6.4%  Diabetes:              >6.4%  Glycemic control for   <7.0% adults with diabetes     CBG: Recent Labs  Lab 01/08/21 2345 01/09/21 0311 01/09/21 0740 01/09/21 1128 01/10/21 1726  GLUCAP 186* 179* 214* 241* 239*    Review of Systems:   Patient trached on ventilator. Unable to obtain info from patient. Information obtained from chart and [facility]'s notes.  Past Medical History:  She,  has a past medical history of Acute on chronic respiratory failure with hypoxia ([place]), ARDS (adult respiratory distress syndrome) ([place]), Chronic kidney disease, stage II (mild), MRSA (methicillin resistant staphylococcus aureus) pneumonia ([place]), and Obstructive chronic bronchitis without exacerbation ([place]).   Surgical History:   Past Surgical History:  Procedure Laterality Date  . TRACHEOSTOMY       Social History:      Family History:  Her family history includes CAD in her mother.   Allergies No Known Allergies   Home Medications  Prior to Admission medications   Medication Sig Start Date End Date Taking? Authorizing Provider  allopurinol (ZYLOPRIM) 100 MG tablet Take 200 mg by mouth daily.    [provider]  amiodarone (PACERONE) 200 MG tablet Take 200 mg by mouth daily. 10/25/20   [provider]  atorvastatin (LIPITOR) 20 MG tablet Take 20 mg by mouth at bedtime. 11/26/20   [provider]  carbidopa-levodopa (SINEMET IR) 25-100 MG tablet Take 1 tablet by mouth every 6 (six) hours. 10/28/20   [provider]  ceFEPIme 2 g in sodium chloride 0.9 % 100 mL Inject 2 g into the vein every 8 (eight) hours for 6 days. 01/09/21 01/15/21  Audria Nine, DO  chlorhexidine (PERIDEX) 0.12 % solution Use as directed 15 mLs in the mouth or throat 2 (two) times daily.    [provider]  cholecalciferol (VITAMIN D3) 25 MCG (1000 UNIT) tablet Take 1,000 Units by mouth daily.    [provider]  divalproex (DEPAKOTE SPRINKLE) 125 MG capsule Take 500 mg by mouth 2 (two) times daily. 11/24/20   [provider]  ELIQUIS 5 MG TABS tablet Take 1 tablet by mouth 2 (two) times daily. 12/02/20   [provider]  FLUoxetine (PROZAC) 10 MG tablet Take 10 mg by mouth daily. 11/08/20   [provider]  furosemide (LASIX) 20 MG tablet Take 20 mg by mouth every 12 (twelve) hours. 12/02/20   [provider]  insulin aspart (NOVOLOG) 100 UNIT/ML injection Inject 0-10 Units into the skin every 6 (six) hours. Sliding scale    [provider]  insulin glargine (LANTUS) 100 UNIT/ML injection Inject 44 Units into the skin at bedtime.    [provider]  ipratropium-albuterol (DUONEB) 0.5-2.5 (3) MG/3ML SOLN Take 3 mLs by nebulization every 4 (four) hours as needed (shortness of breath).    [provider]  magnesium oxide (MAG-OX) 400 MG tablet Take 400 mg by mouth 2 (two) times daily.    [provider]  melatonin 3 MG TABS tablet Take 6 mg by mouth at bedtime.    [provider]  memantine (NAMENDA) 5 MG tablet Take 5 mg by mouth 2 (two) times daily.  11/26/20   [provider]  metoprolol tartrate  (LOPRESSOR) 25 MG tablet Take 25 mg by mouth 2 (two) times daily.    [provider]  miconazole (MICOTIN) 2 % powder Apply 1 application topically every 12 (twelve) hours.    [provider]  OLANZapine (ZYPREXA) 10 MG tablet Take 10 mg by mouth at bedtime. 11/16/20   [provider]  pantoprazole (PROTONIX) 40 MG tablet Take 40 mg by mouth daily.    [provider]  polyethylene glycol (MIRALAX / GLYCOLAX) 17 g packet Take 17 g by mouth daily.    [provider]  potassium chloride (KLOR-CON) 20 MEQ packet Take 40 mEq by mouth daily.    [provider]  pramipexole (MIRAPEX) 0.125 MG tablet Take 0.125 mg by mouth every 8 (eight) hours. 11/05/20   [provider]  propylthiouracil (PTU) 50 MG tablet Take 50 mg by mouth every 12 (twelve) hours. 10/19/20   [provider]  senna-docusate (SENOKOT-S) 8.6-50 MG tablet Take 2 tablets by mouth at bedtime.    [provider]     Critical care time: [place], PA-C  Pulmonary & Critical Care 01/14/2021, 4:45 PM  Please see [email or web address] for pager details.  From 7A-7P if no response, please call [phone]. After hours, please call ELink [phone].

## 2021-01-14 NOTE — ED Provider Notes (Signed)
[facility] EMERGENCY DEPARTMENT Provider Note   CSN: [id number] Arrival date & time: 01/14/21  0840     History Chief Complaint  Patient presents with  . Several Unresponsive Episodes    Lindsey Collins is a 75 y.o. female.  75 year old female with extensive past medical history below including chronic respiratory failure s/p trach on chronic vent, g-tube, Parkinson's disease, T2DM, CKD who p/w unresponsiveness.  [facility] nursing facility reported to EMS that the patient has had several brief episodes of unresponsiveness between the hours of 3 and 6 AM that were witnessed by staff.  It is unclear what happened during these episodes.  Patient has had variable blood pressure readings by EMS but no hypotension.  She is on her chronic vent settings.  For EMS, she has been awake and able to respond to basic questions including giving a "thumbs up."  She was evaluated 4 days ago for similar symptoms.   LEVEL 5 CAVEAT DUE TO NON-VERBAL  The history is provided by the EMS personnel and the nursing home.       Past Medical History:  Diagnosis Date  . Acute on chronic respiratory failure with hypoxia ([place])   . ARDS (adult respiratory distress syndrome) ([place])   . Chronic kidney disease, stage II (mild)   . MRSA (methicillin resistant staphylococcus aureus) pneumonia ([place])   . Obstructive chronic bronchitis without exacerbation [facility])     Patient Active Problem List   Diagnosis Date Noted  . Community acquired pneumonia of right lung   . Chronic respiratory failure with hypoxia ([place]) 12/20/2020  . Status post tracheostomy ([place]) 12/20/2020  . Normocytic anemia 12/20/2020  . Parkinson's disease ([place]) 12/20/2020  . Diabetes mellitus type 2, uncontrolled ([place]) 12/20/2020  . Acute on chronic respiratory failure with hypoxia and hypercapnia ([facility])   . MRSA (methicillin resistant staphylococcus aureus) pneumonia ([place])   . MRSA (methicillin resistant staphylococcus aureus)  pneumonia ([place])   . Obstructive chronic bronchitis without exacerbation ([place])   . Chronic kidney disease, stage II (mild)     Past Surgical History:  Procedure Laterality Date  . TRACHEOSTOMY       OB History   No obstetric history on file.     Family History  Problem Relation Age of Onset  . CAD Mother        hx CABG       Home Medications Prior to Admission medications   Medication Sig Start Date End Date Taking? Authorizing Provider  allopurinol (ZYLOPRIM) 100 MG tablet Take 200 mg by mouth daily.    [provider]  amiodarone (PACERONE) 200 MG tablet Take 200 mg by mouth daily. 10/25/20   [provider]  atorvastatin (LIPITOR) 20 MG tablet Take 20 mg by mouth at bedtime. 11/26/20   [provider]  carbidopa-levodopa (SINEMET IR) 25-100 MG tablet Take 1 tablet by mouth every 6 (six) hours. 10/28/20   [provider]  ceFEPIme 2 g in sodium chloride 0.9 % 100 mL Inject 2 g into the vein every 8 (eight) hours for 6 days. 01/09/21 01/15/21  Audria Nine, DO  chlorhexidine (PERIDEX) 0.12 % solution Use as directed 15 mLs in the mouth or throat 2 (two) times daily.    [provider]  cholecalciferol (VITAMIN D3) 25 MCG (1000 UNIT) tablet Take 1,000 Units by mouth daily.    [provider]  divalproex (DEPAKOTE SPRINKLE) 125 MG capsule Take 500 mg by mouth 2 (two) times daily. 11/24/20  [provider]  ELIQUIS 5 MG TABS tablet Take 1 tablet by mouth 2 (two) times daily. 12/02/20   [provider]  FLUoxetine (PROZAC) 10 MG tablet Take 10 mg by mouth daily. 11/08/20   [provider]  furosemide (LASIX) 20 MG tablet Take 20 mg by mouth every 12 (twelve) hours. 12/02/20   [provider]  insulin aspart (NOVOLOG) 100 UNIT/ML injection Inject 0-10 Units into the skin every 6 (six) hours. Sliding scale    [provider]  insulin glargine (LANTUS) 100 UNIT/ML injection Inject 44 Units  into the skin at bedtime.    [provider]  ipratropium-albuterol (DUONEB) 0.5-2.5 (3) MG/3ML SOLN Take 3 mLs by nebulization every 4 (four) hours as needed (shortness of breath).    [provider]  magnesium oxide (MAG-OX) 400 MG tablet Take 400 mg by mouth 2 (two) times daily.    [provider]  melatonin 3 MG TABS tablet Take 6 mg by mouth at bedtime.    [provider]  memantine (NAMENDA) 5 MG tablet Take 5 mg by mouth 2 (two) times daily. 11/26/20   [provider]  metoprolol tartrate (LOPRESSOR) 25 MG tablet Take 25 mg by mouth 2 (two) times daily.    [provider]  miconazole (MICOTIN) 2 % powder Apply 1 application topically every 12 (twelve) hours.    [provider]  OLANZapine (ZYPREXA) 10 MG tablet Take 10 mg by mouth at bedtime. 11/16/20   [provider]  pantoprazole (PROTONIX) 40 MG tablet Take 40 mg by mouth daily.    [provider]  polyethylene glycol (MIRALAX / GLYCOLAX) 17 g packet Take 17 g by mouth daily.    [provider]  potassium chloride (KLOR-CON) 20 MEQ packet Take 40 mEq by mouth daily.    [provider]  pramipexole (MIRAPEX) 0.125 MG tablet Take 0.125 mg by mouth every 8 (eight) hours. 11/05/20   [provider]  propylthiouracil (PTU) 50 MG tablet Take 50 mg by mouth every 12 (twelve) hours. 10/19/20   [provider]  senna-docusate (SENOKOT-S) 8.6-50 MG tablet Take 2 tablets by mouth at bedtime.    [provider]    Allergies    Patient has no known allergies.  Review of Systems   Review of Systems  Unable to perform ROS: Patient nonverbal    Physical Exam Updated Vital Signs BP 105/70   Pulse (!) 147   Temp 98.1 F (36.7 C) (Rectal)   Resp (!) 22   SpO2 96%   Physical Exam Vitals and nursing note reviewed.  Constitutional:      General: She is not in acute distress.    Appearance: She is obese.     Comments:  Chronically ill-appearing, on vent, no acute distress  HENT:     Head: Normocephalic and atraumatic.  Eyes:     Conjunctiva/sclera: Conjunctivae normal.     Pupils: Pupils are equal, round, and reactive to light.  Cardiovascular:     Rate and Rhythm: Regular rhythm. Tachycardia present.     Heart sounds: Normal heart sounds. No murmur heard.   Pulmonary:     Comments: On vent; intermittent wheezes b/l Abdominal:     General: Abdomen is flat. Bowel sounds are normal. There is no distension.     Palpations: Abdomen is soft.     Tenderness: There is no abdominal tenderness.     Comments: g-tube in place with black tubing which facility  stated is from potassium supplements; no surrounding erythema or drainage  Musculoskeletal:     Right lower leg: Edema present.     Left lower leg: Edema present.     Comments: 2+ pitting edema BLE to knees  Skin:    General: Skin is warm and dry.  Neurological:     Mental Status: She is alert.     Comments: arousable by voice, able to squeeze hand to command, non-verbal     ED Results / Procedures / Treatments   Labs (all labs ordered are listed, but only abnormal results are displayed) Labs Reviewed  COMPREHENSIVE METABOLIC PANEL - Abnormal; Notable for the following components:      Result Value   Chloride 88 (*)    CO2 39 (*)    Glucose, Bld 435 (*)    BUN 31 (*)    Creatinine, Ser 1.04 (*)    Calcium 8.8 (*)    Total Protein 6.3 (*)    Albumin 1.8 (*)    AST 57 (*)    Alkaline Phosphatase 171 (*)    GFR, Estimated 56 (*)    All other components within normal limits  CBC WITH DIFFERENTIAL/PLATELET - Abnormal; Notable for the following components:   WBC 12.2 (*)    RBC 2.55 (*)    Hemoglobin 7.4 (*)    HCT 24.9 (*)    MCHC 29.7 (*)    RDW 16.2 (*)    Neutro Abs 10.7 (*)    Lymphs Abs 0.6 (*)    Abs Immature Granulocytes 0.17 (*)    All other components within normal limits  URINALYSIS, ROUTINE W REFLEX MICROSCOPIC - Abnormal;  Notable for the following components:   APPearance HAZY (*)    Glucose, UA >=500 (*)    Hgb urine dipstick LARGE (*)    Ketones, ur 5 (*)    Protein, ur 100 (*)    RBC / HPF >50 (*)    All other components within normal limits  AMMONIA - Abnormal; Notable for the following components:   Ammonia 50 (*)    All other components within normal limits  LACTIC ACID, PLASMA - Abnormal; Notable for the following components:   Lactic Acid, Venous 2.9 (*)    All other components within normal limits  BRAIN NATRIURETIC PEPTIDE - Abnormal; Notable for the following components:   B Natriuretic Peptide 706.5 (*)    All other components within normal limits  I-STAT VENOUS BLOOD GAS, ED - Abnormal; Notable for the following components:   pH, Ven 7.453 (*)    pCO2, Ven 64.1 (*)    pO2, Ven 164.0 (*)    Bicarbonate 44.9 (*)    TCO2 47 (*)    Acid-Base Excess 19.0 (*)    Calcium, Ion 1.08 (*)    HCT 25.0 (*)    Hemoglobin 8.5 (*)    All other components within normal limits  TROPONIN I (HIGH SENSITIVITY) - Abnormal; Notable for the following components:   Troponin I (High Sensitivity) 22 (*)    All other components within normal limits  URINE CULTURE  CULTURE, BLOOD (ROUTINE X 2)  CULTURE, BLOOD (ROUTINE X 2)  RESP PANEL BY RT-PCR (FLU A&B, COVID) ARPGX2  MRSA PCR SCREENING  TSH  OCCULT BLOOD GASTRIC / DUODENUM (SPECIMEN CUP)    EKG EKG Interpretation  Date/Time:  Tuesday Jan 14 2021 08:50:50 EDT Ventricular Rate:  132 PR Interval:  183 QRS Duration: 84 QT Interval:  334 QTC Calculation: 495  R Axis:   97 Text Interpretation: Sinus or ectopic atrial tachycardia Atrial premature complex Right axis deviation Low voltage, precordial leads Repol abnrm suggests ischemia, anterolateral tachycardia new from previous Confirmed by Theotis Burrow [phone]) on 01/14/2021 9:27:12 AM   Radiology CT Angio Chest PE W and/or Wo Contrast  Result Date: 01/14/2021 CLINICAL DATA:  Tachycardia. Infiltrates  versus edema on recent chest radiograph. EXAM: CT ANGIOGRAPHY CHEST WITH CONTRAST TECHNIQUE: Multidetector CT imaging of the chest was performed using the standard protocol during bolus administration of intravenous contrast. Multiplanar CT image reconstructions and MIPs were obtained to evaluate the vascular anatomy. CONTRAST:  18mL OMNIPAQUE IOHEXOL 350 MG/ML SOLN COMPARISON:  Recent chest radiograph and recent abdominal and pelvis CT. FINDINGS: Cardiovascular: Calcified and noncalcified plaque in the nonaneurysmal thoracic aorta. Mild to moderately enlarged.  No pericardial effusion. No sign of pulmonary embolism to the segmental level. Beyond this in the lung bases there is limitation secondary to respiratory motion. Mediastinum/Nodes: Thoracic inlet structures are normal aside from tracheostomy tube which terminates in the proximal trachea. Scattered small lymph nodes throughout the chest none displaying pathologic enlargement. There is however fullness of bilateral hilar lymph nodes more likely reactive given diffuse parenchymal findings in the chest outlined below. Esophagus grossly normal. Lungs/Pleura: Areas of ground-glass and septal thickening with scattered nodular airspace component. RIGHT upper and RIGHT lower lobe involvement with diffuse involvement in the LEFT chest more pronounced in the LEFT lower lobe and mid chest. No pneumothorax. Small bilateral pleural effusions Upper Abdomen: Nodular cirrhotic appearing liver. Partially imaged G-tube better seen on recent CT of the abdomen and pelvis. No acute upper abdominal process to the extent visualized. Musculoskeletal: No acute bone finding or destructive bone process. Spinal degenerative changes. Review of the MIP images confirms the above findings. IMPRESSION: 1. No sign of pulmonary embolism to the segmental level. Beyond this in the lung bases there is limitation secondary to respiratory motion. 2. Findings of potential multifocal pneumonia versus  asymmetric pulmonary edema. 3. Suspect reactive changes in bilateral hilar and mediastinal lymph nodes none pathologically enlarged by CT standards. Consider short interval follow-up of above findings and nodular pattern in the chest in 8-12 weeks. 4. Hepatic cirrhosis incompletely imaged. 5. Aortic atherosclerosis. Aortic Atherosclerosis (ICD10-I70.0). Electronically Signed   By: Zetta Bills M.D.   On: 01/14/2021 15:15   DG Chest Port 1 View  Result Date: 01/14/2021 CLINICAL DATA:  Chronic ventilator patient.  Unresponsive episodes. EXAM: PORTABLE CHEST 1 VIEW COMPARISON:  01/07/2021. FINDINGS: Tracheostomy tube in stable position. Cardiomegaly. Pulmonary venous congestion cannot be excluded. Prominent right upper lobe and left base pulmonary infiltrates/edema. No prominent pleural effusion. No pneumothorax. IMPRESSION: 1.  Tracheostomy tube in stable position. 2.  Cardiomegaly.  Pulmonary venous congestion cannot be excluded. 3. Prominent right upper lobe and left base pulmonary infiltrates/edema. Electronically Signed   By: Marcello Moores  Register   On: 01/14/2021 09:33    Procedures .Critical Care Performed by: Sharlett Iles, MD Authorized by: Sharlett Iles, MD   Critical care provider statement:    Critical care time (minutes):  45   Critical care time was exclusive of:  Separately billable procedures and treating other patients   Critical care was necessary to treat or prevent imminent or life-threatening deterioration of the following conditions:  Cardiac failure and respiratory failure   Critical care was time spent personally by me on the following activities:  Development of treatment plan with patient or surrogate, evaluation of patient's response to treatment, examination  of patient, obtaining history from patient or surrogate, ordering and performing treatments and interventions, ordering and review of laboratory studies, ordering and review of radiographic studies,  re-evaluation of patient's condition and review of old charts     Medications Ordered in ED Medications  ceFEPIme (MAXIPIME) 2 g in sodium chloride 0.9 % 100 mL IVPB (has no administration in time range)  vancomycin (VANCOREADY) IVPB 1500 mg/300 mL (1,500 mg Intravenous New Bag/Given 01/14/21 1608)  vancomycin (VANCOREADY) IVPB 750 mg/150 mL (has no administration in time range)  ipratropium-albuterol (DUONEB) 0.5-2.5 (3) MG/3ML nebulizer solution 3 mL (3 mLs Nebulization Given 01/14/21 0915)  lactated ringers bolus 500 mL (0 mLs Intravenous Stopped 01/14/21 1120)  iohexol (OMNIPAQUE) 350 MG/ML injection 50 mL (50 mLs Intravenous Contrast Given 01/14/21 1441)    ED Course  I have reviewed the triage vital signs and the nursing notes.  Pertinent labs & imaging results that were available during my care of the patient were reviewed by me and considered in my medical decision making (see chart for details).    MDM Rules/Calculators/A&P                          Chronically ill-appearing on exam but stable from a respiratory standpoint on home vent settings.  Some peripheral edema noted.  She was tachycardic with sinus tachycardia on EKG.  Blood pressure initially 124/65.  She later began having lower blood pressure readings and I initiated of fluid bolus with labs to screen for potential sepsis.  Rectal temperature is normal.  VBG shows pH 7.45/PCO2 64; blood glucose 435, BUN 31, creatinine 1.04, WBC 12.2, hemoglobin 7.4 which is slightly lower than previous however blood gas hemoglobin shows 8.5.  X-ray with cardiomegaly and several areas of edema versus infiltrate.  BNP 706, troponin 22.  I do not see any history of heart failure on her chart.  Normal TSH.  At lactate 2.9.  UA without obvious infection.  Blood and urine cultures sent.  Obtain CTA to evaluate for PE given the patient's tachycardia and new heart failure and nonspecific chest x-ray abnormalities.  CTA is negative for PE, shows multifocal  pneumonia versus asymmetric pulmonary edema.  I have ordered vancomycin and cefepime for HCAP coverage.  She has received a total of 1 L of fluids from here and EMS.  We will hold off on further fluid resuscitation at this time due to elevated BNP.  Discussed admission with critical care, [facility], and they will evaluate pt for admission. Screening COVID sent.  Final Clinical Impression(s) / ED Diagnoses Final diagnoses:  Congestive heart failure, unspecified HF chronicity, unspecified heart failure type [facility])    Rx / DC Orders ED Discharge Orders    None       Sieara Bremer, Wenda Overland, MD 01/14/21 1614

## 2021-01-14 NOTE — Progress Notes (Signed)
ANTICOAGULATION CONSULT NOTE - Initial Consult  Pharmacy Consult for heparin Indication: atrial fibrillation  No Known Allergies  Patient Measurements:   Heparin Dosing Weight: 76kg  Vital Signs: Temp: 98.1 F (36.7 C) (05/03 1122) Temp Source: Rectal (05/03 1122) BP: 95/82 (05/03 1745) Pulse Rate: 148 (05/03 1745)  Labs: Recent Labs    01/14/21 0915 01/14/21 0954 01/14/21 1003  HGB 7.4*  --  8.5*  HCT 24.9*  --  25.0*  PLT 384  --   --   CREATININE 1.04*  --   --   TROPONINIHS  --  22*  --     Estimated Creatinine Clearance: 52.9 mL/min (A) (by C-G formula based on SCr of 1.04 mg/dL (H)).   Medical History: Past Medical History:  Diagnosis Date  . Acute on chronic respiratory failure with hypoxia ([place])   . ARDS (adult respiratory distress syndrome) ([place])   . Chronic kidney disease, stage II (mild)   . MRSA (methicillin resistant staphylococcus aureus) pneumonia ([place])   . Obstructive chronic bronchitis without exacerbation ([place])    Assessment: 35 YOF presenting from [facility] with episodes of unresponsiveness, on Eliquis PTA for afib on hold for potential procedures.  [facility] MAR shows last dose 5/2 evening.  Goal of Therapy:  Heparin level 0.3-0.7 units/ml aPTT 66-102 seconds Monitor platelets by anticoagulation protocol: Yes   Plan:  Heparin gtt at 1100 units/hr, no bolus F/u 8 hour aPTT/HL F/u ability to restart PO  Bertis Ruddy, PharmD Clinical Pharmacist ED Pharmacist Phone # [phone] 01/14/2021 5:57 PM

## 2021-01-14 NOTE — ED Triage Notes (Signed)
Pt BIB [facility] from [facility] d/t having "several unresponsive episodes." Staff reported to [facility] that over the night between 0300-0600 pt would not respond. She would respond to pain & then she wouyld be back to baseline. Nods at baseline, nonverbal. CBG 300, Clamped PEG (black from K+ administration in it over time), stable trach on vent (vented since October 2021). BP fluctuated from 140/80 to 105/70, 140 bpn (normal for her), PIV is 22G in Lt hand.

## 2021-01-14 NOTE — ED Notes (Signed)
Attempted to call report to 3M 

## 2021-01-14 NOTE — ED Notes (Signed)
Pt's BP soft while sleeping. Was 40 SBP, then woken up & cuff was repositioned with a smaller size cuff & SBP went to 91.

## 2021-01-14 NOTE — ED Notes (Signed)
Pt was showing 75% O2 on monitor, RT was called to bedside. Her vent was adjusted.

## 2021-01-14 NOTE — ED Notes (Signed)
Bleeding noted post Trach change & RT was called to bedside. This RN cleaned pt & RT assisted changing split gauze for trach. Pt tolerated this well.

## 2021-01-14 NOTE — Progress Notes (Addendum)
Pharmacy Antibiotic Note  Lindsey Collins is a 75 y.o. female admitted on 01/14/2021 presenting from [facility] with [place] [facility].  Pharmacy has been consulted for vancomycin dosing.  Plan: Vancomycin 1500 mg IV x 1, then 750 mg IV every 24 hours (eAUC 413, Goal AUC 400-550, SCr 1.04) Add MRSA PCR Monitor renal function, Cx/PCR to narrow Vancomycin levels as needed  Addendum: Add cefepime 2g IV every 12 hours     Temp (24hrs), Avg:98.1 F (36.7 C), Min:98.1 F (36.7 C), Max:98.1 F (36.7 C)  Recent Labs  Lab 01/07/21 1754 01/08/21 0050 01/08/21 1017 01/09/21 0437 01/10/21 0916 01/14/21 0915  WBC  --   --  10.1 10.1 11.6* 12.2*  CREATININE  --  0.73  --  0.70 0.78 1.04*  LATICACIDVEN 1.6  --   --   --   --  2.9*    Estimated Creatinine Clearance: 52.9 mL/min (A) (by C-G formula based on SCr of 1.04 mg/dL (H)).    No Known Allergies  Bertis Ruddy, PharmD Clinical Pharmacist ED Pharmacist Phone # [phone] 01/14/2021 3:41 PM

## 2021-01-14 NOTE — Progress Notes (Signed)
Ventilator patient transported from ED06 to [facility] and back without any complications.

## 2021-01-14 NOTE — Procedures (Signed)
Pre-op dx  Acute on chronic Hypoxic respiratory failure  In-effective mechanical ventilation due to tracheostomy cuff leak and poor proximal positioning w/ occlusion of distal trach tip at posterior tracheal wall.   Post-op dx Acute on chronic Hypoxic respiratory failure  In-effective mechanical ventilation due to tracheostomy cuff leak and poor proximal positioning w/ occlusion of distal trach tip at posterior tracheal wall.  Tracheomalacia   Consent: Emergent due to life threatening hypoxia  Procedure  Time out completed. The 6 portex trach was removed after deflating cuff. Initially tried TTS fixed XLT biovona trach w/ matching external diameter. This was easily inserted but upon bronchoscopic evaluation the distal tip of the trach was at the level of carina and w/ neck flection would migrate to RMS. Because of this and body habitus we opted to try the # 5 Proximal XLT cuffed trach. This was only about 2 cm large than the trach she came in with and I was able to advance this easily into the stoma. We again advanced the bronchoscope into the new trach and verified that the trach was sitting w/in the midline of airway and there was adequate distance between the carina and distal tip of the trach.   Pt tolerated well.   PCXR ordered  Erick Colace ACNP-BC [place] Pager # [phone] OR # [phone] if no answer

## 2021-01-14 NOTE — Procedures (Signed)
Bedside Bronchoscopy Procedure Note Lindsey Collins [id number] [birth year]  Procedure: Bronchoscopy Indications: Diagnostic evaluation of the airways  Procedure Details: ET Tube Size: ET Tube secured at lip (cm): Bite block in place: No In preparation for procedure, Patient hyper-oxygenated with 100 % FiO2 Airway entered and the following bronchi were examined: RUL and LUL.   Bronchoscope removed.  , Patient placed back on 100% FiO2 at conclusion of procedure.    Evaluation BP (!) 199/95   Pulse (!) 144   Temp 98.1 F (36.7 C) (Rectal)   Resp (!) 25   SpO2 97%  Breath Sounds:Diminished O2 sats: stable throughout Patient's Current Condition: stable Specimens:  None Complications: No apparent complications Patient did tolerate procedure well.   Sheelah Ritacco 01/14/2021, 5:34 PM

## 2021-01-15 ENCOUNTER — Inpatient Hospital Stay (HOSPITAL_COMMUNITY): Payer: Medicare Other

## 2021-01-15 DIAGNOSIS — D649 Anemia, unspecified: Secondary | ICD-10-CM | POA: Diagnosis not present

## 2021-01-15 DIAGNOSIS — J9622 Acute and chronic respiratory failure with hypercapnia: Secondary | ICD-10-CM

## 2021-01-15 DIAGNOSIS — N179 Acute kidney failure, unspecified: Secondary | ICD-10-CM

## 2021-01-15 DIAGNOSIS — J9621 Acute and chronic respiratory failure with hypoxia: Secondary | ICD-10-CM | POA: Diagnosis not present

## 2021-01-15 DIAGNOSIS — J80 Acute respiratory distress syndrome: Secondary | ICD-10-CM | POA: Diagnosis not present

## 2021-01-15 DIAGNOSIS — I480 Paroxysmal atrial fibrillation: Secondary | ICD-10-CM

## 2021-01-15 DIAGNOSIS — Z9911 Dependence on respirator [ventilator] status: Secondary | ICD-10-CM | POA: Diagnosis not present

## 2021-01-15 LAB — BASIC METABOLIC PANEL
Anion gap: 10 (ref 5–15)
BUN: 34 mg/dL — ABNORMAL HIGH (ref 8–23)
CO2: 36 mmol/L — ABNORMAL HIGH (ref 22–32)
Calcium: 8.5 mg/dL — ABNORMAL LOW (ref 8.9–10.3)
Chloride: 92 mmol/L — ABNORMAL LOW (ref 98–111)
Creatinine, Ser: 1.21 mg/dL — ABNORMAL HIGH (ref 0.44–1.00)
GFR, Estimated: 47 mL/min — ABNORMAL LOW (ref >60)
Glucose, Bld: 151 mg/dL — ABNORMAL HIGH (ref 70–99)
Potassium: 4.5 mmol/L (ref 3.5–5.1)
Sodium: 138 mmol/L (ref 135–145)

## 2021-01-15 LAB — CBC
HCT: 20 % — ABNORMAL LOW (ref 36.0–46.0)
Hemoglobin: 6.1 g/dL — CL (ref 12.0–15.0)
MCH: 29 pg (ref 26.0–34.0)
MCHC: 30.5 g/dL (ref 30.0–36.0)
MCV: 95.2 fL (ref 80.0–100.0)
Platelets: 345 10*3/uL (ref 150–400)
RBC: 2.1 MIL/uL — ABNORMAL LOW (ref 3.87–5.11)
RDW: 16.3 % — ABNORMAL HIGH (ref 11.5–15.5)
WBC: 9.2 10*3/uL (ref 4.0–10.5)
nRBC: 0.9 % — ABNORMAL HIGH (ref 0.0–0.2)

## 2021-01-15 LAB — MRSA PCR SCREENING: MRSA by PCR: POSITIVE — AB

## 2021-01-15 LAB — URINE CULTURE

## 2021-01-15 LAB — PHOSPHORUS: Phosphorus: 5.5 mg/dL — ABNORMAL HIGH (ref 2.5–4.6)

## 2021-01-15 LAB — APTT: aPTT: 60 seconds — ABNORMAL HIGH (ref 24–36)

## 2021-01-15 LAB — GLUCOSE, CAPILLARY
Glucose-Capillary: 147 mg/dL — ABNORMAL HIGH (ref 70–99)
Glucose-Capillary: 137 mg/dL — ABNORMAL HIGH (ref 70–99)
Glucose-Capillary: 146 mg/dL — ABNORMAL HIGH (ref 70–99)
Glucose-Capillary: 204 mg/dL — ABNORMAL HIGH (ref 70–99)
Glucose-Capillary: 254 mg/dL — ABNORMAL HIGH (ref 70–99)
Glucose-Capillary: 316 mg/dL — ABNORMAL HIGH (ref 70–99)

## 2021-01-15 LAB — LACTIC ACID, PLASMA: Lactic Acid, Venous: 1.5 mmol/L (ref 0.5–1.9)

## 2021-01-15 LAB — HEMOGLOBIN AND HEMATOCRIT, BLOOD
HCT: 28.9 % — ABNORMAL LOW (ref 36.0–46.0)
Hemoglobin: 8.9 g/dL — ABNORMAL LOW (ref 12.0–15.0)

## 2021-01-15 LAB — MAGNESIUM: Magnesium: 1.8 mg/dL (ref 1.7–2.4)

## 2021-01-15 LAB — ABO/RH: ABO/RH(D): A POS

## 2021-01-15 LAB — STREP PNEUMONIAE URINARY ANTIGEN: Strep Pneumo Urinary Antigen: NEGATIVE

## 2021-01-15 LAB — PREPARE RBC (CROSSMATCH)

## 2021-01-15 LAB — HEPARIN LEVEL (UNFRACTIONATED): Heparin Unfractionated: >1.1 IU/mL — ABNORMAL HIGH (ref 0.30–0.70)

## 2021-01-15 MED ORDER — LEVALBUTEROL HCL 0.63 MG/3ML IN NEBU
0.6300 mg | INHALATION_SOLUTION | RESPIRATORY_TRACT | Status: DC
  Administered 2021-01-15 – 2021-01-16 (×6): 0.63 mg via RESPIRATORY_TRACT
  Filled 2021-01-15 (×5): qty 3

## 2021-01-15 MED ORDER — ALBUMIN HUMAN 5 % IV SOLN
12.5000 g | Freq: Once | INTRAVENOUS | Status: AC
  Administered 2021-01-15: 12.5 g via INTRAVENOUS
  Filled 2021-01-15: qty 250

## 2021-01-15 MED ORDER — PANTOPRAZOLE SODIUM 40 MG IV SOLR
40.0000 mg | Freq: Two times a day (BID) | INTRAVENOUS | Status: DC
  Administered 2021-01-15 – 2021-01-20 (×11): 40 mg via INTRAVENOUS
  Filled 2021-01-15 (×11): qty 40

## 2021-01-15 MED ORDER — VALPROIC ACID 250 MG/5ML PO SOLN
500.0000 mg | Freq: Two times a day (BID) | ORAL | Status: DC
  Administered 2021-01-15 – 2021-01-20 (×10): 500 mg
  Filled 2021-01-15 (×9): qty 10

## 2021-01-15 MED ORDER — FUROSEMIDE 10 MG/ML IJ SOLN
40.0000 mg | Freq: Once | INTRAMUSCULAR | Status: AC
  Administered 2021-01-15: 40 mg via INTRAVENOUS
  Filled 2021-01-15: qty 4

## 2021-01-15 MED ORDER — SODIUM CHLORIDE 0.9% IV SOLUTION: Freq: Once | INTRAVENOUS | Status: AC

## 2021-01-15 MED ORDER — IPRATROPIUM BROMIDE 0.02 % IN SOLN
0.5000 mg | RESPIRATORY_TRACT | Status: DC
  Administered 2021-01-15 – 2021-01-16 (×6): 0.5 mg via RESPIRATORY_TRACT
  Filled 2021-01-15 (×5): qty 2.5

## 2021-01-15 MED ORDER — MAGNESIUM SULFATE 2 GM/50ML IV SOLN
2.0000 g | Freq: Once | INTRAVENOUS | Status: AC
  Administered 2021-01-15: 2 g via INTRAVENOUS
  Filled 2021-01-15: qty 50

## 2021-01-15 MED ORDER — INSULIN ASPART 100 UNIT/ML IJ SOLN
3.0000 [IU] | INTRAMUSCULAR | Status: DC
  Administered 2021-01-15 – 2021-01-16 (×5): 3 [IU] via SUBCUTANEOUS

## 2021-01-15 MED ORDER — PROSOURCE TF PO LIQD
45.0000 mL | Freq: Every day | ORAL | Status: DC
  Administered 2021-01-15 – 2021-01-20 (×6): 45 mL
  Filled 2021-01-15 (×5): qty 45

## 2021-01-15 MED ORDER — PREDNISONE 20 MG PO TABS
40.0000 mg | ORAL_TABLET | Freq: Every day | ORAL | Status: DC
  Administered 2021-01-15 – 2021-01-19 (×5): 40 mg
  Filled 2021-01-15 (×5): qty 2

## 2021-01-15 MED ORDER — VITAL AF 1.2 CAL PO LIQD
1000.0000 mL | ORAL | Status: DC
  Administered 2021-01-15 – 2021-01-20 (×6): 1000 mL
  Filled 2021-01-15 (×4): qty 1000

## 2021-01-15 NOTE — Progress Notes (Signed)
Spoke with patients daughter, Elvie Palomo regarding blood products. Per daughter, blood products refusal in chart is incorrect. Daughter would like for patient to get any blood products needed. Okay with patient receiving PRBC's, FFP, platelets, etc. Earney Hamburg, RN witnessed. Will continue to monitor.

## 2021-01-15 NOTE — Progress Notes (Signed)
[facility] Progress Note Patient Name: Lindsey Collins DOB: 05-28-1946 MRN: [id number]   Date of Service  01/15/2021  HPI/Events of Note  Serum lactate up to 5.5 from 2.5, serum albumin 1.8.  eICU Interventions  Albumin 5 % 250 ml iv fluid bolus x 1 ordered.        Audon Heymann U Shanise Balch 01/15/2021, 2:50 AM

## 2021-01-15 NOTE — Progress Notes (Signed)
Initial Nutrition Assessment  DOCUMENTATION CODES:   Obesity unspecified  INTERVENTION:   Initiate tube feeds via G-tube: - Vital AF 1.2 @ 60 ml/hr (1440 ml/day) - ProSource TF 45 ml daily  Tube feeding regimen provides 1768 kcal, 119 grams of protein, and 1168 ml of H2O.   NUTRITION DIAGNOSIS:   Inadequate oral intake related to inability to eat as evidenced by NPO status.  GOAL:   Patient will meet greater than or equal to 90% of their needs  MONITOR:   Vent status,Labs,Weight trends,TF tolerance,I & O's  REASON FOR ASSESSMENT:   Ventilator,Consult Enteral/tube feeding initiation and management  ASSESSMENT:   75 year old female who presented from [facility] to the ED on 5/03 after several unresponsive episodes. PMH of chronic respiratory failure s/p trach on chronic vent, G-tube, Parkinson's disease, T2DM, CKD, COPD, CHF, atrial fibrillation. Pt admitted with presumed ARDS.   RD is familiar with pt from recent previous admission. Discussed pt with RN and during ICU rounds. Pt with pneumonia, AKI. Consult received for tube feeding initiation and management. Pt with G-tube in place.  Pt with deep pitting edema to BUE and BLE. Difficult to determine dry weight at this time. Most recent weight is from 4/26 which was during previous admission. Recommend obtaining updated admission weight.  Patient on ventilator support via trach. MV: 9.6 L/min Temp (24hrs), Avg:97.9 F (36.6 C), Min:97.4 F (36.3 C), Max:99.2 F (37.3 C)  Drips: Precedex Heparin  Medications reviewed and include: sinemet, colace, SSI q 4 hours, lantus 22 units daily, zyprexa, IV protonix, miralax, IV abx, IV magnesium sulfate 2 grams once  Labs reviewed: BUN 34, creatinine 1.21, phosphorus 5.5, lactic acid 5.5, hemoglobin 6.1 CBG's: 137-228 x 24 hours  NUTRITION - FOCUSED PHYSICAL EXAM:  Flowsheet Row Most Recent Value  Orbital Region No depletion  Upper Arm Region No depletion  Thoracic and  Lumbar Region No depletion  Buccal Region No depletion  Temple Region No depletion  Clavicle Bone Region No depletion  Clavicle and Acromion Bone Region No depletion  Scapular Bone Region Unable to assess  Dorsal Hand No depletion  Patellar Region No depletion  Anterior Thigh Region No depletion  Posterior Calf Region No depletion  Edema (RD Assessment) Moderate  [BLE]  Hair Reviewed  Eyes Reviewed  Mouth Reviewed  Skin Reviewed  Nails Reviewed       Diet Order:   Diet Order            Diet NPO time specified  Diet effective now                 EDUCATION NEEDS:   No education needs have been identified at this time  Skin:  Skin Assessment: Reviewed RN Assessment  Last BM:  no documented BM  Height:   Ht Readings from Last 1 Encounters:  01/07/21 5\' 4"  (1.626 m)    Weight:   Wt Readings from Last 1 Encounters:  01/09/21 94.5 kg    Ideal Body Weight:  54.5 kg  BMI:  35.8 kg/m^2  Estimated Nutritional Needs:   Kcal:  1650-1850  Protein:  110-130 grams  Fluid:  1.6-1.8 L    Gustavus Bryant, MS, RD, LDN Inpatient Clinical Dietitian Please see AMiON for contact information.

## 2021-01-15 NOTE — Progress Notes (Signed)
Ingenio for heparin Indication: atrial fibrillation   Labs: Recent Labs    01/14/21 0915 01/14/21 0954 01/14/21 1003 01/14/21 2041 01/15/21 0427  HGB 7.4*  --  8.5* 8.8* 6.1*  HCT 24.9*  --  25.0* 26.0* 20.0*  PLT 384  --   --   --  345  APTT  --   --   --   --  60*  HEPARINUNFRC  --   --   --   --  >1.10*  CREATININE 1.04*  --   --   --  1.21*  TROPONINIHS  --  22*  --   --   --    Assessment: 63 YOF presenting from [facility] with episodes of unresponsiveness, on Eliquis PTA for afib on hold for potential procedures.  [facility] MAR shows last dose 5/2 evening. Initial aPTT 60 sec, heparin level elevated due to eliquis.  Hg down to 6.1, no active bleeding noted per RN  Goal of Therapy:  Heparin level 0.3-0.7 units/ml aPTT 66-102 seconds Monitor platelets by anticoagulation protocol: Yes   Plan:  Increase heparin 1200 units/hr, no bolus F/u 8 hour aPTT/HL F/u ability to restart PO  Thanks for allowing pharmacy to be a part of this patient's care.  Excell Seltzer, PharmD Clinical Pharmacist  01/15/2021 5:42 AM

## 2021-01-15 NOTE — Progress Notes (Addendum)
eLink Physician-Brief Progress Note Patient Name: Lindsey Collins DOB: [birth year] MRN: [id number]   Date of Service  01/15/2021  HPI/Events of Note  Hemoglobin 6.1 gm / dl.  eICU Interventions  Transfuse 1 unit PRBC. Daughter Areanna Gengler gave informed consent for blood transfusion to bedside RN.        Kerry Kass Naimah Yingst 01/15/2021, 5:49 AM

## 2021-01-15 NOTE — Progress Notes (Addendum)
NAME:  HECTOR VENNE, MRN:  [id number], DOB:  1946-08-10, LOS: 1 ADMISSION DATE:  01/14/2021, CONSULTATION DATE:  5/3 REFERRING MD:  Dr. Rex Kras, CHIEF COMPLAINT:  Unresponsiveness   History of Present Illness:  75 yo F with PMH below which is significant for chronic vent dependent with trach, parkinson's, T2DM, CKD, HFpEF, Afib on eliquis and amio CC of multiple episodes of unresponsiveness. Resides at [facility]. Has had trach for past 2 years for MRSA pneumonia. March 2022 patient admitted for serratia pneumonia and discharged with Cefepime. Recently admitted to [facility] on 01/07/2021 with serratia pneumonia still present. Another course of Cefepime ordered and patient was later discharged 01/09/2021. While at [facility] patient was continued on Cefepime, however, patient was still not doing well. 01/12/2021 patient was requiring increased FiO2 requirement and hypoxic.   Patient presents 01/14/2021 to [facility] with multiple periods of unresponsiveness earlier this morning. Patient appears critically ill on home ventilator settings. Patient remains tachycardic in the 140s. CTA negative for PE, shows multifocal pneumonia versus asymmetric pulmonary edema. CXR shows several areas of edema vs. Infiltrate; worse in RUL. Pertinent Labs show Glucose 435, Hb 7.4, BNP 706, Lactic acid 2.9. 1 L of fluids total given to patient. Fluids on hold due to elevated BNP. Vanc and Cefepime started.  Pertinent  Medical History   has a past medical history of Acute on chronic respiratory failure with hypoxia ([place]), ARDS (adult respiratory distress syndrome) ([place]), Chronic kidney disease, stage II (mild), MRSA (methicillin resistant staphylococcus aureus) pneumonia ([place]), and Obstructive chronic bronchitis without exacerbation ([place]  Significant Hospital Events: Including procedures, antibiotic start and stop dates in addition to other pertinent events   . 4/26-4/28 Admitted/Discharged from [facility] for Serratia pneumonia. Sent to  [facility] on Cefepime. . 5/3 Admitted to [facility] for periods of unresponsiveness and ARDS. Trach changed to 5 mm cuffed proximal XLT shiley.  . 5/4: Lactate increased from 2.5 to 5.5. Albumin 5% fluid bolus. Hb 6.1 overnight; 1 unit PRBC transfused.   Interim History / Subjective:   Vent settings PRVC 70% and 5 PEEP; Ppeak 33/Pplat 31 BP 79/48 TMax 99.2 UO -350; total net -247   Objective   Blood pressure (!) 79/48, pulse 76, temperature (!) 97.4 F (36.3 C), temperature source Axillary, resp. rate (!) 28, SpO2 94 %.    Vent Mode: PRVC FiO2 (%):  [40 %-100 %] 80 % Set Rate:  [20 bmp] 20 bmp Vt Set:  [330 mL-440 mL] 330 mL PEEP:  [5 cmH20-10 cmH20] 5 cmH20 Plateau Pressure:  [26 cmH20-33 cmH20] 29 cmH20   Intake/Output Summary (Last 24 hours) at 01/15/2021 6606 Last data filed at 01/15/2021 0200 Gross per 24 hour  Intake 102.89 ml  Output 350 ml  Net -247.11 ml   There were no vitals filed for this visit.  Examination:  General: ill appearing; chronic vent dependent trach patient HEENT: MM pink/moist Neuro: alert and oriented x4 CV: s1s2, no m/r/g PULM: Ex Wheezing throughout; On home vent settings: PRVC 70% 5 PEEP. Ppeak 33/Pplat 31 GI: soft, bsx4 active; g tube present Extremities: warm/dry, 2+ pitting edema up to the knee BLE Skin: no rashes or lesions appreciated  CXR 5/4: B/L opacities; slightly worse in the RUL. Small B/L effusions.  Labs/imaging that I havepersonally reviewed  (right click and "Reselect all SmartList Selections" daily)    Resolved Hospital Problem list     Assessment & Plan:   Presumed ARDS (ABG pending) Chronic hypercarbic respiratory failure: (Vent dependent at [place]; Trach changed  on 01/14/2021 to proximal XLT) Serratia Pneumonia: culture on 4/26, on Cefepime since that time, clinically presenting with pneumonia. - Sputum culture pending; continue to follow - Place on 6-8 cc/kg; wean FiO2 for sats >90% - PRN Fentanyl, Versed, Precedex: RASS  -1,-2 - Continue Cefepime and Vanc; consider adding/changing pending sputum culture - CXR in morning - Pulm Hygiene q6 - VAP protocol - increased frequency of Atrovent/Xopenex to q4 for wheezing - Started Prednisone 40 mg for wheezing - 40 mg Lasix ordered  Hyperglycemia Hx of DM2 - SSI  - CBG q4: goal 140-180 - resume home basal insulin at half dose - continue tube feeds  Tachycardia Paroxsymal Afib: (on Eliquis at [facility]) Elevated BNP Hx HFpEF - Heparin infusion on hold due to anemia - continue home amiodarone - trend labs  Anemia Hypotension Lactic Acidosis with NAG - 1 unit transfused overnight (Hgb 6.1 on 5/4); continue to transfuse for hgb <7 - recheck H/H this afternoon - CBC in morning - Continue Albumin - Fecal occult blood to evaluate source of anemia - Recheck lactate this afternoon - Heparin stopped   AKI -(Creat 1.21; BUN 32; Phos 5.5 on 5/4 labs) Hx of CKD - Check BMP in the morning; continue to monitor U/O - avoid nephrotoxic drugs  Hx of Mood disorder, Bipolar, Parkinsons - Continue home med depakote, Carbidopa-levodopa, prozac, memantine, olanzapine  Questionable Hyperthyroidism (Home med PTU) - Continue home PTU - TSH normal    Best practice (right click and "Reselect all SmartList Selections" daily)  Diet:  Tube Feed  Pain/Anxiety/Delirium protocol (if indicated): Yes (RASS goal -1,-2) VAP protocol (if indicated): Yes DVT prophylaxis: SCD GI prophylaxis: PPI Glucose control:  SSI Yes and Basal insulin Yes Central venous access:  N/A Arterial line:  N/A Foley:  N/A Mobility:  bed rest  PT consulted: N/A Last date of multidisciplinary goals of care discussion Code Status:  full code Disposition: ICU  Labs   CBC: Recent Labs  Lab 01/08/21 1017 01/09/21 0437 01/10/21 0916 01/14/21 0915 01/14/21 1003 01/14/21 2041 01/15/21 0427  WBC 10.1 10.1 11.6* 12.2*  --   --  9.2  NEUTROABS  --   --   --  10.7*  --   --   --   HGB 8.1*  8.8* 8.0* 7.4* 8.5* 8.8* 6.1*  HCT 26.5* 28.3* 26.8* 24.9* 25.0* 26.0* 20.0*  MCV 96.4 96.3 99.3 97.6  --   --  95.2  PLT 195 221 241 384  --   --  154    Basic Metabolic Panel: Recent Labs  Lab 01/08/21 1017 01/08/21 1745 01/09/21 0437 01/09/21 0437 01/10/21 0916 01/14/21 0915 01/14/21 1003 01/14/21 2041 01/15/21 0427  NA  --   --  137   < > 138 135 136 137 138  K  --   --  3.3*   < > 3.7 4.4 4.4 4.3 4.5  CL  --   --  101  --  104 88*  --   --  92*  CO2  --   --  27  --  26 39*  --   --  36*  GLUCOSE  --   --  223*  --  317* 435*  --   --  151*  BUN  --   --  12  --  8 31*  --   --  34*  CREATININE  --   --  0.70  --  0.78 1.04*  --   --  1.21*  CALCIUM  --   --  8.0*  --  8.0* 8.8*  --   --  8.5*  MG 1.9 2.3 2.2  --   --   --   --   --  1.8  PHOS 3.4 3.6 3.5  --   --   --   --   --  5.5*   < > = values in this interval not displayed.   GFR: Estimated Creatinine Clearance: 45.5 mL/min (A) (by C-G formula based on SCr of 1.21 mg/dL (H)). Recent Labs  Lab 01/09/21 0437 01/10/21 0916 01/14/21 0915 01/14/21 1922 01/14/21 2235 01/15/21 0427  WBC 10.1 11.6* 12.2*  --   --  9.2  LATICACIDVEN  --   --  2.9* 2.5* 5.5*  --     Liver Function Tests: Recent Labs  Lab 01/10/21 0916 01/14/21 0915  AST 33 57*  ALT 17 36  ALKPHOS 72 171*  BILITOT 0.5 0.4  PROT 5.4* 6.3*  ALBUMIN 2.1* 1.8*   No results for input(s): LIPASE, AMYLASE in the last 168 hours. Recent Labs  Lab 01/14/21 0915  AMMONIA 50*    ABG    Component Value Date/Time   PHART 7.435 01/14/2021 2041   PCO2ART 62.7 (H) 01/14/2021 2041   PO2ART 209 (H) 01/14/2021 2041   HCO3 42.0 (H) 01/14/2021 2041   TCO2 44 (H) 01/14/2021 2041   O2SAT 100.0 01/14/2021 2041     Coagulation Profile: No results for input(s): INR, PROTIME in the last 168 hours.  Cardiac Enzymes: No results for input(s): CKTOTAL, CKMB, CKMBINDEX, TROPONINI in the last 168 hours.  HbA1C: Hgb A1c MFr Bld  Date/Time Value Ref  Range Status  12/20/2020 01:17 PM 7.2 (H) 4.8 - 5.6 % Final    Comment:    (NOTE) Pre diabetes:          5.7%-6.4%  Diabetes:              >6.4%  Glycemic control for   <7.0% adults with diabetes     CBG: Recent Labs  Lab 01/09/21 1128 01/10/21 1726 01/14/21 1943 01/14/21 2341 01/15/21 0339  GLUCAP 241* 239* 228* 186* 147*    Review of Systems:   Patient trached on ventilator. Information retrieved from chart review.  Past Medical History:  She,  has a past medical history of Acute on chronic respiratory failure with hypoxia ([place]), ARDS (adult respiratory distress syndrome) ([place]), Chronic kidney disease, stage II (mild), MRSA (methicillin resistant staphylococcus aureus) pneumonia ([place]), and Obstructive chronic bronchitis without exacerbation ([place]).   Surgical History:   Past Surgical History:  Procedure Laterality Date  . TRACHEOSTOMY       Social History:      Family History:  Her family history includes CAD in her mother.   Allergies No Known Allergies   Home Medications  Prior to Admission medications   Medication Sig Start Date End Date Taking? Authorizing Provider  allopurinol (ZYLOPRIM) 100 MG tablet Take 200 mg by mouth daily.    [provider]  amiodarone (PACERONE) 200 MG tablet Take 200 mg by mouth daily. 10/25/20   [provider]  atorvastatin (LIPITOR) 20 MG tablet Take 20 mg by mouth at bedtime. 11/26/20   [provider]  carbidopa-levodopa (SINEMET IR) 25-100 MG tablet Take 1 tablet by mouth every 6 (six) hours. 10/28/20   [provider]  ceFEPIme 2 g in sodium chloride 0.9 % 100 mL Inject 2 g into the vein every 8 (eight)  hours for 6 days. 01/09/21 01/15/21  Audria Nine, DO  chlorhexidine (PERIDEX) 0.12 % solution Use as directed 15 mLs in the mouth or throat 2 (two) times daily.    [provider]  cholecalciferol (VITAMIN D3) 25 MCG (1000 UNIT) tablet Take 1,000 Units by mouth daily.    [provider]  divalproex (DEPAKOTE SPRINKLE) 125 MG capsule Take 500 mg by mouth 2 (two) times daily. 11/24/20   [provider]  ELIQUIS 5 MG TABS tablet Take 1 tablet by mouth 2 (two) times daily. 12/02/20   [provider]  FLUoxetine (PROZAC) 10 MG tablet Take 10 mg by mouth daily. 11/08/20   [provider]  furosemide (LASIX) 20 MG tablet Take 20 mg by mouth every 12 (twelve) hours. 12/02/20   [provider]  insulin aspart (NOVOLOG) 100 UNIT/ML injection Inject 0-10 Units into the skin every 6 (six) hours. Sliding scale    [provider]  insulin glargine (LANTUS) 100 UNIT/ML injection Inject 44 Units into the skin at bedtime.    [provider]  ipratropium-albuterol (DUONEB) 0.5-2.5 (3) MG/3ML SOLN Take 3 mLs by nebulization every 4 (four) hours as needed (shortness of breath).    [provider]  magnesium oxide (MAG-OX) 400 MG tablet Take 400 mg by mouth 2 (two) times daily.    [provider]  melatonin 3 MG TABS tablet Take 6 mg by mouth at bedtime.    [provider]  memantine (NAMENDA) 5 MG tablet Take 5 mg by mouth 2 (two) times daily. 11/26/20   [provider]  metoprolol tartrate (LOPRESSOR) 25 MG tablet Take 25 mg by mouth 2 (two) times daily.    [provider]  miconazole (MICOTIN) 2 % powder Apply 1 application topically every 12 (twelve) hours.    [provider]  OLANZapine (ZYPREXA) 10 MG tablet Take 10 mg by mouth at bedtime. 11/16/20   [provider]  pantoprazole (PROTONIX) 40 MG tablet Take 40 mg by mouth daily.    [provider]  polyethylene glycol (MIRALAX / GLYCOLAX) 17 g packet Take 17 g by mouth daily.    [provider]  potassium chloride (KLOR-CON) 20 MEQ packet Take 40 mEq by mouth daily.    [provider]  pramipexole (MIRAPEX) 0.125 MG tablet Take 0.125 mg by mouth every 8 (eight) hours. 11/05/20   [provider]  propylthiouracil (PTU) 50 MG tablet Take 50 mg by mouth every 12 (twelve) hours. 10/19/20   [provider]  senna-docusate (SENOKOT-S) 8.6-50 MG tablet Take 2 tablets by mouth at bedtime.    [provider]     Critical care time: 61    Critical care was time spent personally by me on the following activities: development of treatment plan with patient and/or surrogate as well as nursing, discussions with consultants, evaluation of patient's response to treatment, examination of patient, obtaining history from patient or surrogate, ordering and performing treatments and interventions, ordering and review of laboratory studies, ordering and review of radiographic studies, pulse oximetry and re-evaluation of patient's condition.  JD Rexene Agent [facility] 01/15/2021, 7:12 AM  Please see [email or web address] for pager details.  From 7A-7P if no response, please call [phone]. After hours, please call ELink [phone].

## 2021-01-16 ENCOUNTER — Inpatient Hospital Stay (HOSPITAL_COMMUNITY): Payer: Medicare Other

## 2021-01-16 DIAGNOSIS — R739 Hyperglycemia, unspecified: Secondary | ICD-10-CM | POA: Diagnosis not present

## 2021-01-16 DIAGNOSIS — Z9911 Dependence on respirator [ventilator] status: Secondary | ICD-10-CM | POA: Diagnosis not present

## 2021-01-16 DIAGNOSIS — J80 Acute respiratory distress syndrome: Secondary | ICD-10-CM | POA: Diagnosis not present

## 2021-01-16 DIAGNOSIS — J9621 Acute and chronic respiratory failure with hypoxia: Secondary | ICD-10-CM | POA: Diagnosis not present

## 2021-01-16 LAB — BPAM RBC
Blood Product Expiration Date: 202205272359
ISSUE DATE / TIME: 202205040947
Unit Type and Rh: 6200

## 2021-01-16 LAB — TYPE AND SCREEN
ABO/RH(D): A POS
Antibody Screen: NEGATIVE
Unit division: 0

## 2021-01-16 LAB — CBC
HCT: 24.5 % — ABNORMAL LOW (ref 36.0–46.0)
Hemoglobin: 7.7 g/dL — ABNORMAL LOW (ref 12.0–15.0)
MCH: 29.5 pg (ref 26.0–34.0)
MCHC: 31.4 g/dL (ref 30.0–36.0)
MCV: 93.9 fL (ref 80.0–100.0)
Platelets: 403 10*3/uL — ABNORMAL HIGH (ref 150–400)
RBC: 2.61 MIL/uL — ABNORMAL LOW (ref 3.87–5.11)
RDW: 16.2 % — ABNORMAL HIGH (ref 11.5–15.5)
WBC: 8.4 10*3/uL (ref 4.0–10.5)
nRBC: 0.5 % — ABNORMAL HIGH (ref 0.0–0.2)

## 2021-01-16 LAB — COMPREHENSIVE METABOLIC PANEL
ALT: 19 U/L (ref 0–44)
AST: 53 U/L — ABNORMAL HIGH (ref 15–41)
Albumin: 1.9 g/dL — ABNORMAL LOW (ref 3.5–5.0)
Alkaline Phosphatase: 194 U/L — ABNORMAL HIGH (ref 38–126)
Anion gap: 10 (ref 5–15)
BUN: 48 mg/dL — ABNORMAL HIGH (ref 8–23)
CO2: 37 mmol/L — ABNORMAL HIGH (ref 22–32)
Calcium: 8.6 mg/dL — ABNORMAL LOW (ref 8.9–10.3)
Chloride: 91 mmol/L — ABNORMAL LOW (ref 98–111)
Creatinine, Ser: 1.11 mg/dL — ABNORMAL HIGH (ref 0.44–1.00)
GFR, Estimated: 52 mL/min — ABNORMAL LOW (ref >60)
Glucose, Bld: 290 mg/dL — ABNORMAL HIGH (ref 70–99)
Potassium: 4.2 mmol/L (ref 3.5–5.1)
Sodium: 138 mmol/L (ref 135–145)
Total Bilirubin: 0.5 mg/dL (ref 0.3–1.2)
Total Protein: 5.9 g/dL — ABNORMAL LOW (ref 6.5–8.1)

## 2021-01-16 LAB — GLUCOSE, CAPILLARY
Glucose-Capillary: 246 mg/dL — ABNORMAL HIGH (ref 70–99)
Glucose-Capillary: 257 mg/dL — ABNORMAL HIGH (ref 70–99)
Glucose-Capillary: 258 mg/dL — ABNORMAL HIGH (ref 70–99)
Glucose-Capillary: 262 mg/dL — ABNORMAL HIGH (ref 70–99)
Glucose-Capillary: 283 mg/dL — ABNORMAL HIGH (ref 70–99)
Glucose-Capillary: 299 mg/dL — ABNORMAL HIGH (ref 70–99)

## 2021-01-16 LAB — MAGNESIUM: Magnesium: 2.4 mg/dL (ref 1.7–2.4)

## 2021-01-16 LAB — URINE CULTURE

## 2021-01-16 LAB — PHOSPHORUS: Phosphorus: 4.7 mg/dL — ABNORMAL HIGH (ref 2.5–4.6)

## 2021-01-16 MED ORDER — FUROSEMIDE 10 MG/ML IJ SOLN
40.0000 mg | Freq: Once | INTRAMUSCULAR | Status: AC
  Administered 2021-01-16: 40 mg via INTRAVENOUS
  Filled 2021-01-16: qty 4

## 2021-01-16 MED ORDER — INSULIN ASPART 100 UNIT/ML IJ SOLN
5.0000 [IU] | INTRAMUSCULAR | Status: DC
  Administered 2021-01-16 – 2021-01-18 (×9): 5 [IU] via SUBCUTANEOUS

## 2021-01-16 MED ORDER — SODIUM CHLORIDE 0.9 % IV SOLN
1.0000 g | Freq: Three times a day (TID) | INTRAVENOUS | Status: DC
  Administered 2021-01-16 – 2021-01-20 (×13): 1 g via INTRAVENOUS
  Filled 2021-01-16 (×15): qty 1

## 2021-01-16 MED ORDER — CHLORHEXIDINE GLUCONATE 0.12% ORAL RINSE (MEDLINE KIT)
15.0000 mL | Freq: Two times a day (BID) | OROMUCOSAL | Status: DC
  Administered 2021-01-16 – 2021-01-20 (×7): 15 mL via OROMUCOSAL

## 2021-01-16 MED ORDER — LEVALBUTEROL HCL 0.63 MG/3ML IN NEBU: 0.6300 mg | INHALATION_SOLUTION | RESPIRATORY_TRACT | Status: DC | PRN

## 2021-01-16 MED ORDER — INSULIN GLARGINE 100 UNIT/ML ~~LOC~~ SOLN
30.0000 [IU] | Freq: Every day | SUBCUTANEOUS | Status: DC
  Administered 2021-01-16: 30 [IU] via SUBCUTANEOUS
  Filled 2021-01-16 (×2): qty 0.3

## 2021-01-16 MED ORDER — SODIUM CHLORIDE 3 % IN NEBU
4.0000 mL | INHALATION_SOLUTION | Freq: Two times a day (BID) | RESPIRATORY_TRACT | Status: AC
  Administered 2021-01-16 – 2021-01-17 (×4): 4 mL via RESPIRATORY_TRACT
  Filled 2021-01-16 (×4): qty 4

## 2021-01-16 MED ORDER — IPRATROPIUM BROMIDE 0.02 % IN SOLN
0.5000 mg | Freq: Four times a day (QID) | RESPIRATORY_TRACT | Status: DC
  Administered 2021-01-16 – 2021-01-20 (×16): 0.5 mg via RESPIRATORY_TRACT
  Filled 2021-01-16 (×18): qty 2.5

## 2021-01-16 MED ORDER — LEVALBUTEROL HCL 0.63 MG/3ML IN NEBU
0.6300 mg | INHALATION_SOLUTION | Freq: Four times a day (QID) | RESPIRATORY_TRACT | Status: DC
  Administered 2021-01-16 – 2021-01-20 (×16): 0.63 mg via RESPIRATORY_TRACT
  Filled 2021-01-16 (×18): qty 3

## 2021-01-16 MED ORDER — ORAL CARE MOUTH RINSE
15.0000 mL | OROMUCOSAL | Status: DC
  Administered 2021-01-16 – 2021-01-20 (×33): 15 mL via OROMUCOSAL

## 2021-01-16 NOTE — Progress Notes (Signed)
Patient asleep resting well CPT withheld at this time.  Will continue in AM.

## 2021-01-16 NOTE — Progress Notes (Signed)
NAME:  Lindsey Collins, MRN:  [id number], DOB:  25-Jan-1946, LOS: 2 ADMISSION DATE:  01/14/2021, CONSULTATION DATE:  5/3 REFERRING MD:  Dr. Rex Kras, CHIEF COMPLAINT:  Unresponsiveness   History of Present Illness:  75 yo F with PMH below which is significant for chronic vent dependent with trach, parkinson's, T2DM, CKD, HFpEF, Afib on eliquis and amio CC of multiple episodes of unresponsiveness. Resides at [facility]. Has had trach for past 2 years for MRSA pneumonia. March 2022 patient admitted for serratia pneumonia and discharged with Cefepime. Recently admitted to [facility] on 01/07/2021 with serratia pneumonia still present. Another course of Cefepime ordered and patient was later discharged 01/09/2021. While at [facility] patient was continued on Cefepime, however, patient was still not doing well. 01/12/2021 patient was requiring increased FiO2 requirement and hypoxic.   Patient presents 01/14/2021 to [facility] with multiple periods of unresponsiveness earlier this morning. Patient appears critically ill on home ventilator settings. Patient remains tachycardic in the 140s. CTA negative for PE, shows multifocal pneumonia versus asymmetric pulmonary edema. CXR shows several areas of edema vs. Infiltrate; worse in RUL. Pertinent Labs show Glucose 435, Hb 7.4, BNP 706, Lactic acid 2.9. 1 L of fluids total given to patient. Fluids on hold due to elevated BNP. Vanc and Cefepime started.  Pertinent  Medical History   has a past medical history of Acute on chronic respiratory failure with hypoxia ([place]), ARDS (adult respiratory distress syndrome) ([place]), Chronic kidney disease, stage II (mild), MRSA (methicillin resistant staphylococcus aureus) pneumonia ([place]), and Obstructive chronic bronchitis without exacerbation ([place]).  Significant Hospital Events: Including procedures, antibiotic start and stop dates in addition to other pertinent events   . 4/26-4/28 Admitted/Discharged from [facility] for Serratia pneumonia. Sent to  [facility] on Cefepime. . 5/3 Admitted to [facility] for periods of unresponsiveness and ARDS. Trach changed to 5 mm cuffed proximal XLT shiley.  . 5/4: Lactate increased from 2.5 to 5.5. Albumin 5% fluid bolus. Hb 6.1 overnight; 1 unit PRBC transfused.   Interim History / Subjective:   Much more alert No overt source of bleeding noted Weaning FiO2 as tolerated Continue gentle diuresis Elevated glucose addressed with increasing basal insulin and tube feeding coverage   Objective   Blood pressure 112/64, pulse 71, temperature 97.7 F (36.5 C), temperature source Axillary, resp. rate (!) 21, weight 97.3 kg, SpO2 98 %.    Vent Mode: PRVC FiO2 (%):  [80 %-100 %] 80 % Set Rate:  [20 bmp] 20 bmp Vt Set:  [330 mL] 330 mL PEEP:  [8 cmH20] 8 cmH20 Plateau Pressure:  [29 cmH20-32 cmH20] 29 cmH20   Intake/Output Summary (Last 24 hours) at 01/16/2021 6433 Last data filed at 01/16/2021 0600 Gross per 24 hour  Intake 2032.34 ml  Output 1500 ml  Net 532.34 ml   Filed Weights   01/16/21 0500  Weight: 97.3 kg    Examination:  General: Elderly female who appears much more interactive today HEENT: Tracheostomy to ventilator Neuro: Intact follows commands CV: Heart sounds are regular regular rate and rhythm PULM: Decreased breath sounds throughout Vent pressure regulated volume control FIO2 decreased to 75% at 7:50 PEEP 8 RATE 20+2 VT 330 Plateau pressure 19  GI: soft, bsx4 active positive bowel sounds Extremities: warm/dry, 2+ edema  Skin: no rashes or lesions     cxr 5/5  Labs/imaging that I havepersonally reviewed  (right click and "Reselect all SmartList Selections" daily)   Cxr, bmet, [facility] Problem list     Assessment &  Plan:   Presumed ARDS with vent dependence x 2 years Chronic hypercarbic respiratory failure: (Vent dependent at [facility]; Trach changed on 01/14/2021 to proximal XLT) Serratia Pneumonia: culture on 4/26, on Cefepime since that time, clinically  presenting with pneumonia. MRSA pcr +  01/16/2021 decreased FiO2 to 75% Continue gentle diuresis Continue antimicrobial therapy and follow culture data Steroids were added on 01/15/2021 for wheezing Xopenex and Atrovent changed to every 6 hours expressions.  Every 4 hours. Hypertonic nebulizers twice daily x48 hours Sedation as needed Serial chest x-rays    Hyperglycemia Hx of DM2, started on steroids 01/15/2021  Increase basal insulin and meal coverage Continue to monitor as steroids are weaned  Tachycardia Paroxsymal Afib: (on Eliquis at [facility]) Elevated BNP Hx HFpEF Continue amiodarone Questionable timing of resumption of anticoagulation  Anemia Recent Labs    01/15/21 1439 01/16/21 0235  HGB 8.9* 7.7*    Hypotension Lactic Acidosis with NAG Continue to hold heparin for now Transfuse per protocol Questionable source of bleeding if any  AKI -(Creat 1.21; BUN 32; Phos 5.5 on 5/4 labs) Lab Results  Component Value Date   CREATININE 1.11 (H) 01/16/2021   CREATININE 1.21 (H) 01/15/2021   CREATININE 1.04 (H) 01/14/2021    Hx of CKD Monitor creatinine Gentle diuresis Hx of Mood disorder, Bipolar, Parkinsons Continue home meds Questionable Hyperthyroidism (Home med PTU) Continue home PTU    Best practice (right click and "Reselect all SmartList Selections" daily)  Diet:  Tube Feed  Pain/Anxiety/Delirium protocol (if indicated): Yes (RASS goal -1,-2) VAP protocol (if indicated): Yes DVT prophylaxis: SCD GI prophylaxis: PPI Glucose control:  SSI Yes and Basal insulin Yes Central venous access:  N/A Arterial line:  N/A Foley:  N/A Mobility:  bed rest  PT consulted: N/A Last date of multidisciplinary goals of care discussion Code Status:  full code Disposition: ICU  Labs   CBC: Recent Labs  Lab 01/10/21 0916 01/14/21 0915 01/14/21 1003 01/14/21 2041 01/15/21 0427 01/15/21 1439 01/16/21 0235  WBC 11.6* 12.2*  --   --  9.2  --  8.4  NEUTROABS  --   10.7*  --   --   --   --   --   HGB 8.0* 7.4* 8.5* 8.8* 6.1* 8.9* 7.7*  HCT 26.8* 24.9* 25.0* 26.0* 20.0* 28.9* 24.5*  MCV 99.3 97.6  --   --  95.2  --  93.9  PLT 241 384  --   --  345  --  403*    Basic Metabolic Panel: Recent Labs  Lab 01/10/21 0916 01/14/21 0915 01/14/21 1003 01/14/21 2041 01/15/21 0427 01/16/21 0235  NA 138 135 136 137 138 138  K 3.7 4.4 4.4 4.3 4.5 4.2  CL 104 88*  --   --  92* 91*  CO2 26 39*  --   --  36* 37*  GLUCOSE 317* 435*  --   --  151* 290*  BUN 8 31*  --   --  34* 48*  CREATININE 0.78 1.04*  --   --  1.21* 1.11*  CALCIUM 8.0* 8.8*  --   --  8.5* 8.6*  MG  --   --   --   --  1.8 2.4  PHOS  --   --   --   --  5.5* 4.7*   GFR: Estimated Creatinine Clearance: 50.3 mL/min (A) (by C-G formula based on SCr of 1.11 mg/dL (H)). Recent Labs  Lab 01/10/21 [phone] 01/14/21 0915 01/14/21 1922 01/14/21 2235  01/15/21 0427 01/15/21 1439 01/16/21 0235  WBC 11.6* 12.2*  --   --  9.2  --  8.4  LATICACIDVEN  --  2.9* 2.5* 5.5*  --  1.5  --     Liver Function Tests: Recent Labs  Lab 01/10/21 0916 01/14/21 0915 01/16/21 0235  AST 33 57* 53*  ALT 17 36 19  ALKPHOS 72 171* 194*  BILITOT 0.5 0.4 0.5  PROT 5.4* 6.3* 5.9*  ALBUMIN 2.1* 1.8* 1.9*   No results for input(s): LIPASE, AMYLASE in the last 168 hours. Recent Labs  Lab 01/14/21 0915  AMMONIA 50*    ABG    Component Value Date/Time   PHART 7.435 01/14/2021 2041   PCO2ART 62.7 (H) 01/14/2021 2041   PO2ART 209 (H) 01/14/2021 2041   HCO3 42.0 (H) 01/14/2021 2041   TCO2 44 (H) 01/14/2021 2041   O2SAT 100.0 01/14/2021 2041     Coagulation Profile: No results for input(s): INR, PROTIME in the last 168 hours.  Cardiac Enzymes: No results for input(s): CKTOTAL, CKMB, CKMBINDEX, TROPONINI in the last 168 hours.  HbA1C: Hgb A1c MFr Bld  Date/Time Value Ref Range Status  12/20/2020 01:17 PM 7.2 (H) 4.8 - 5.6 % Final    Comment:    (NOTE) Pre diabetes:          5.7%-6.4%  Diabetes:               >6.4%  Glycemic control for   <7.0% adults with diabetes     CBG: Recent Labs  Lab 01/15/21 1528 01/15/21 1937 01/15/21 2321 01/16/21 0342 01/16/21 0730  GLUCAP 204* 254* 316* 257* 262*      Critical care time: 27    App cct   Richardson Landry Tessa Seaberry ACNP Acute Care Nurse Practitioner [place] Please consult Amion 01/16/2021, 8:13 AM

## 2021-01-16 NOTE — Progress Notes (Signed)
TOC following for transition needs. Patient readmitted from [facility] where she resides for long term care including chronic trach vent management.   Manya Silvas, RN MSN CCM Transitions of Care [phone]

## 2021-01-16 NOTE — Progress Notes (Signed)
Pharmacy Antibiotic Note  Lindsey Collins is a 75 y.o. female admitted on 01/14/2021 from [place] with altered mental status and periods of non-responsiveness.  Patient is chronically vent-dependent and has a trach.  Pharmacy has been consulted for meropenem dosing.  Patient recently admitted (end of April) and completed 9 days (last dose 5/4) of Cefepime for Serratia pneumonia.  S/p 2 doses of vancomycin this admission.  CXR still concerning for pneumonia - broadening antibiotics to ensure adequate coverage.  WBC 8.4, afebrile, renal function improving (Scr down to 1.11 from 1.2)  Plan: Meropenem 1g IV q8 hours  F/u respiratory culture Monitor clinical improvement, ability to de-escalate antibiotics  Weight: 97.3 kg (214 lb 8.1 oz)  Temp (24hrs), Avg:98 F (36.7 C), Min:97.3 F (36.3 C), Max:98.6 F (37 C)  Recent Labs  Lab 01/10/21 0916 01/14/21 0915 01/14/21 1922 01/14/21 2235 01/15/21 0427 01/15/21 1439 01/16/21 0235  WBC 11.6* 12.2*  --   --  9.2  --  8.4  CREATININE 0.78 1.04*  --   --  1.21*  --  1.11*  LATICACIDVEN  --  2.9* 2.5* 5.5*  --  1.5  --     Estimated Creatinine Clearance: 50.3 mL/min (A) (by C-G formula based on SCr of 1.11 mg/dL (H)).    No Known Allergies  Antimicrobials this admission: Cefepime (cont from PTA) 4/26 >> 5/4 Vancomycin 5/3 >> 5/4 Meropenem 5/5 >>  Microbiology results: 5/3 BCx: ngtd 5/4 UCx: sent  5/3 Sputum: mod gram neg rods  5/4 MRSA PCR: positive  Thank you for allowing pharmacy to be a part of this patient's care. Dimple Nanas, PharmD PGY-1 Acute Care Pharmacy Resident Office: [phone] 01/16/2021 9:30 AM

## 2021-01-17 ENCOUNTER — Inpatient Hospital Stay (HOSPITAL_COMMUNITY): Payer: Medicare Other

## 2021-01-17 DIAGNOSIS — Z9911 Dependence on respirator [ventilator] status: Secondary | ICD-10-CM | POA: Diagnosis not present

## 2021-01-17 DIAGNOSIS — J9601 Acute respiratory failure with hypoxia: Secondary | ICD-10-CM | POA: Diagnosis not present

## 2021-01-17 DIAGNOSIS — J80 Acute respiratory distress syndrome: Secondary | ICD-10-CM | POA: Diagnosis not present

## 2021-01-17 LAB — BASIC METABOLIC PANEL
Anion gap: 7 (ref 5–15)
BUN: 41 mg/dL — ABNORMAL HIGH (ref 8–23)
CO2: 42 mmol/L — ABNORMAL HIGH (ref 22–32)
Calcium: 9.1 mg/dL (ref 8.9–10.3)
Chloride: 95 mmol/L — ABNORMAL LOW (ref 98–111)
Creatinine, Ser: 0.78 mg/dL (ref 0.44–1.00)
GFR, Estimated: >60 mL/min (ref >60)
Glucose, Bld: 132 mg/dL — ABNORMAL HIGH (ref 70–99)
Potassium: 3.3 mmol/L — ABNORMAL LOW (ref 3.5–5.1)
Sodium: 144 mmol/L (ref 135–145)

## 2021-01-17 LAB — CBC
HCT: 28.7 % — ABNORMAL LOW (ref 36.0–46.0)
Hemoglobin: 9 g/dL — ABNORMAL LOW (ref 12.0–15.0)
MCH: 29.8 pg (ref 26.0–34.0)
MCHC: 31.4 g/dL (ref 30.0–36.0)
MCV: 95 fL (ref 80.0–100.0)
Platelets: 448 10*3/uL — ABNORMAL HIGH (ref 150–400)
RBC: 3.02 MIL/uL — ABNORMAL LOW (ref 3.87–5.11)
RDW: 16.3 % — ABNORMAL HIGH (ref 11.5–15.5)
WBC: 9.4 10*3/uL (ref 4.0–10.5)
nRBC: 0.6 % — ABNORMAL HIGH (ref 0.0–0.2)

## 2021-01-17 LAB — GLUCOSE, CAPILLARY
Glucose-Capillary: 181 mg/dL — ABNORMAL HIGH (ref 70–99)
Glucose-Capillary: 216 mg/dL — ABNORMAL HIGH (ref 70–99)
Glucose-Capillary: 290 mg/dL — ABNORMAL HIGH (ref 70–99)
Glucose-Capillary: 322 mg/dL — ABNORMAL HIGH (ref 70–99)
Glucose-Capillary: 72 mg/dL (ref 70–99)
Glucose-Capillary: 80 mg/dL (ref 70–99)

## 2021-01-17 LAB — MAGNESIUM: Magnesium: 2.4 mg/dL (ref 1.7–2.4)

## 2021-01-17 LAB — POCT I-STAT 7, (LYTES, BLD GAS, ICA,H+H)
Acid-Base Excess: 17 mmol/L — ABNORMAL HIGH (ref 0.0–2.0)
Bicarbonate: 44 mmol/L — ABNORMAL HIGH (ref 20.0–28.0)
Calcium, Ion: 1.22 mmol/L (ref 1.15–1.40)
HCT: 25 % — ABNORMAL LOW (ref 36.0–46.0)
Hemoglobin: 8.5 g/dL — ABNORMAL LOW (ref 12.0–15.0)
O2 Saturation: 97 %
Patient temperature: 97.8
Potassium: 4.7 mmol/L (ref 3.5–5.1)
Sodium: 141 mmol/L (ref 135–145)
TCO2: 46 mmol/L — ABNORMAL HIGH (ref 22–32)
pCO2 arterial: 69.8 mmHg (ref 32.0–48.0)
pH, Arterial: 7.406 (ref 7.350–7.450)
pO2, Arterial: 91 mmHg (ref 83.0–108.0)

## 2021-01-17 LAB — PHOSPHORUS: Phosphorus: 3.2 mg/dL (ref 2.5–4.6)

## 2021-01-17 MED ORDER — INSULIN GLARGINE 100 UNIT/ML ~~LOC~~ SOLN
25.0000 [IU] | Freq: Every day | SUBCUTANEOUS | Status: DC
  Administered 2021-01-17 – 2021-01-18 (×2): 25 [IU] via SUBCUTANEOUS
  Filled 2021-01-17 (×3): qty 0.25

## 2021-01-17 MED ORDER — DOCUSATE SODIUM 50 MG/5ML PO LIQD: 100.0000 mg | Freq: Two times a day (BID) | ORAL | Status: DC | PRN

## 2021-01-17 MED ORDER — FUROSEMIDE 10 MG/ML IJ SOLN
40.0000 mg | Freq: Once | INTRAMUSCULAR | Status: AC
  Administered 2021-01-17: 40 mg via INTRAVENOUS
  Filled 2021-01-17: qty 4

## 2021-01-17 MED ORDER — POLYETHYLENE GLYCOL 3350 17 G PO PACK: 17.0000 g | PACK | Freq: Every day | ORAL | Status: DC | PRN

## 2021-01-17 MED ORDER — POTASSIUM CHLORIDE 20 MEQ PO PACK
20.0000 meq | PACK | ORAL | Status: AC
Start: 2021-01-17 — End: 2021-01-17
  Administered 2021-01-17 (×2): 20 meq
  Filled 2021-01-17 (×2): qty 1

## 2021-01-17 MED ORDER — POTASSIUM CHLORIDE 10 MEQ/100ML IV SOLN
10.0000 meq | INTRAVENOUS | Status: AC
  Administered 2021-01-17 (×3): 10 meq via INTRAVENOUS
  Filled 2021-01-17 (×2): qty 100

## 2021-01-17 MED ORDER — MUPIROCIN 2 % EX OINT
1.0000 "application " | TOPICAL_OINTMENT | Freq: Two times a day (BID) | CUTANEOUS | Status: DC
  Administered 2021-01-17 – 2021-01-20 (×7): 1 via NASAL
  Filled 2021-01-17 (×2): qty 22

## 2021-01-17 MED ORDER — HEPARIN SODIUM (PORCINE) 5000 UNIT/ML IJ SOLN
5000.0000 [IU] | Freq: Three times a day (TID) | INTRAMUSCULAR | Status: DC
  Administered 2021-01-17 – 2021-01-18 (×4): 5000 [IU] via SUBCUTANEOUS
  Filled 2021-01-17 (×4): qty 1

## 2021-01-17 NOTE — Progress Notes (Signed)
K+3.3 ?Replaced per protocol  ?

## 2021-01-17 NOTE — Progress Notes (Signed)
Critical ABG value given to CCM.  RR changed on vent to 24 per CCM request.

## 2021-01-17 NOTE — Progress Notes (Signed)
SLP Cancellation Note  Patient Details Name: Lindsey Collins MRN: [id number] DOB: [birth year]   Cancelled treatment:       Reason Eval/Treat Not Completed: Patient not medically ready-spoke with Dr. Carlis Abbott re: proceeding with inline PMV, she recommended holding given high Fi02. We will follow for readiness.  [name] L. Tivis Ringer, [place] Office number [phone] Pager [phone]    Juan Quam Laurice 01/17/2021, 12:30 PM

## 2021-01-17 NOTE — Progress Notes (Addendum)
NAME:  Lindsey Collins, MRN:  [id number], DOB:  December 25, 1945, LOS: 3 ADMISSION DATE:  01/14/2021, CONSULTATION DATE:  5/3 REFERRING MD:  Dr. Rex Kras, CHIEF COMPLAINT:  Unresponsiveness   History of Present Illness:  75 yo F with PMH below which is significant for chronic vent dependent with trach, parkinson's, T2DM, CKD, HFpEF, Afib on eliquis and amio CC of multiple episodes of unresponsiveness. Resides at [facility]. Has had trach for past 2 years for MRSA pneumonia. March 2022 patient admitted for serratia pneumonia and discharged with Cefepime. Recently admitted to [facility] on 01/07/2021 with serratia pneumonia still present. Another course of Cefepime ordered and patient was later discharged 01/09/2021. While at [facility] patient was continued on Cefepime, however, patient was still not doing well. 01/12/2021 patient was requiring increased FiO2 requirement and hypoxic.   Patient presents 01/14/2021 to [facility] with multiple periods of unresponsiveness earlier this morning. Patient appears critically ill on home ventilator settings. Patient remains tachycardic in the 140s. CTA negative for PE, shows multifocal pneumonia versus asymmetric pulmonary edema. CXR shows several areas of edema vs. Infiltrate; worse in RUL. Pertinent Labs show Glucose 435, Hb 7.4, BNP 706, Lactic acid 2.9. 1 L of fluids total given to patient. Fluids on hold due to elevated BNP. Vanc and Cefepime started.  Pertinent  Medical History   has a past medical history of Acute on chronic respiratory failure with hypoxia ([place]), ARDS (adult respiratory distress syndrome) ([place]), Chronic kidney disease, stage II (mild), MRSA (methicillin resistant staphylococcus aureus) pneumonia ([place]), and Obstructive chronic bronchitis without exacerbation ([place]).  Significant Hospital Events: Including procedures, antibiotic start and stop dates in addition to other pertinent events   . 4/26-4/28 Admitted/Discharged from [facility] for Serratia pneumonia. Sent to  [facility] on Cefepime. . 5/3 Admitted to [facility] for periods of unresponsiveness and ARDS. Trach changed to 5 mm cuffed proximal XLT shiley.  . 5/4: Lactate increased from 2.5 to 5.5. Albumin 5% fluid bolus. Hb 6.1 overnight; 1 unit PRBC transfused.  . 5/6 HGB stable, CXR stable, Increased sleepiness  Interim History / Subjective:   Asleep on my exam,  per nursing more sleepy today HGB 9 with no overt bleeding noted platelets 448 T max 98.5, WBC 9.4 60% FiO2, peak pressures better today 1450 cc out last 24 hours, net + 177 Better control of glucose with increasing basal insulin and tube feeding coverage Na 144, K 3.3 but has been re pleted, Glucose 132, Creatinine 0.78, Calcium 9.1 SR, No fib or runs per nursing Mag of 2.4 AST elevated on 5/5/ labs CO2 on BMET increased to 42 on 5/6 CO2 69 on ABG, compensated , Bicarb of 44   Objective   Blood pressure 138/66, pulse 92, temperature 97.8 F (36.6 C), temperature source Axillary, resp. rate (!) 27, weight 97.3 kg, SpO2 94 %.    Vent Mode: PRVC FiO2 (%):  [50 %-70 %] 60 % Set Rate:  [10 bmp-20 bmp] 20 bmp Vt Set:  [330 mL] 330 mL PEEP:  [8 cmH20] 8 cmH20 Plateau Pressure:  [26 cmH20-33 cmH20] 31 cmH20   Intake/Output Summary (Last 24 hours) at 01/17/2021 0932 Last data filed at 01/17/2021 0809 Gross per 24 hour  Intake 1636.77 ml  Output 2150 ml  Net -513.23 ml   Filed Weights   01/16/21 0500  Weight: 97.3 kg    Examination:  General: Elderly female  Lethargic, in NAD HEENT: Tracheostomy to ventilator, no LAD Neuro:Lethargic but following commands, MAE x 4. Frail and deconditioned CV: Heart  sounds are regular regular rate and rhythm, SR per tele PULM: Bilateral chest excursion, Coarse throughout with rhonchi, no wheeze.  Vent pressure regulated volume control FIO2 decreased to 60 %  PEEP 8 RATE 20+2-3 VT 330 Plateau pressures and peak pressures have improved with trach change  GI: soft, bsx4 active positive bowel sounds,  + BM Extremities: warm/dry, 2+ edema  Skin: no rashes or lesions    CXR 01/17/21    Labs/imaging that I havepersonally reviewed  (right click and "Reselect all SmartList Selections" daily)   Cxr, bmet, [facility] Problem list     Assessment & Plan:   Presumed ARDS with vent dependence x 2 years Chronic hypercarbic respiratory failure: (Vent dependent at [facility]; Trach changed on 01/14/2021 to proximal XLT) Serratia Pneumonia: culture on 4/26, on Cefepime since that time, clinically presenting with pneumonia. MRSA pcr + More lethargic>> CO2 increased to 69 per ABG, well compensated  Plan 01/16/2021 decreased FiO2 to 60% Continue gentle diuresis Will increase rate to 24, and leave TV at 330 ml Continue antimicrobial therapy and follow culture data Steroids were added on 01/15/2021 for wheezing Xopenex and Atrovent changed to every 6 hours expressions.  Every 4 hours. Hypertonic nebulizers twice daily x48 hours Chest PT Minimize Sedation  Serial chest x-rays Will add Bactroban nasal ointment  Hyperglycemia Hx of DM2, started on steroids 01/15/2021 Low of 72 on 5/6 am Plan  ? Slightly low with Increase basal insulin and meal coverage Consider decrease in Lantus coverage Continue to monitor as steroids are weaned  Tachycardia Paroxsymal Afib: (on Eliquis at [facility]) Elevated BNP Hx HFpEF Continue amiodarone Questionable timing of resumption of anticoagulation  Anemia Recent Labs    01/16/21 0235 01/17/21 0205  HGB 7.7* 9.0*    Hypotension Resolved Lactic Acidosis with NAG Lactate cleared Continue to hold heparin for now>> Continue PAS Hose  Transfuse per protocol Questionable source of bleeding if any  AKI -(Creat 1.21; BUN 32; Phos 5.5 on 5/4 labs) Lab Results  Component Value Date   CREATININE 0.78 01/17/2021   CREATININE 1.11 (H) 01/16/2021   CREATININE 1.21 (H) 01/15/2021    Hx of CKD Monitor creatinine Continue Gentle diuresis Monitor  I&O  Hx of Mood disorder, Bipolar, Parkinsons Continue home meds  Questionable Hyperthyroidism (Home med PTU) Continue home PTU    Best practice (right click and "Reselect all SmartList Selections" daily)  Diet:  Tube Feed  Pain/Anxiety/Delirium protocol (if indicated): Yes (RASS goal -1,-2) VAP protocol (if indicated): Yes DVT prophylaxis: SCD GI prophylaxis: PPI Glucose control:  SSI Yes and Basal insulin Yes Central venous access:  N/A Arterial line:  N/A Foley:  N/A Mobility:  bed rest  PT consulted: N/A Last date of multidisciplinary goals of care discussion Code Status:  full code Disposition: ICU  Labs   CBC: Recent Labs  Lab 01/14/21 0915 01/14/21 1003 01/14/21 2041 01/15/21 0427 01/15/21 1439 01/16/21 0235 01/17/21 0205  WBC 12.2*  --   --  9.2  --  8.4 9.4  NEUTROABS 10.7*  --   --   --   --   --   --   HGB 7.4*   < > 8.8* 6.1* 8.9* 7.7* 9.0*  HCT 24.9*   < > 26.0* 20.0* 28.9* 24.5* 28.7*  MCV 97.6  --   --  95.2  --  93.9 95.0  PLT 384  --   --  345  --  403* 448*   < > = values in this  interval not displayed.    Basic Metabolic Panel: Recent Labs  Lab 01/14/21 0915 01/14/21 1003 01/14/21 2041 01/15/21 0427 01/16/21 0235 01/17/21 0205  NA 135 136 137 138 138 144  K 4.4 4.4 4.3 4.5 4.2 3.3*  CL 88*  --   --  92* 91* 95*  CO2 39*  --   --  36* 37* 42*  GLUCOSE 435*  --   --  151* 290* 132*  BUN 31*  --   --  34* 48* 41*  CREATININE 1.04*  --   --  1.21* 1.11* 0.78  CALCIUM 8.8*  --   --  8.5* 8.6* 9.1  MG  --   --   --  1.8 2.4 2.4  PHOS  --   --   --  5.5* 4.7* 3.2   GFR: Estimated Creatinine Clearance: 69.8 mL/min (by C-G formula based on SCr of 0.78 mg/dL). Recent Labs  Lab 01/14/21 0915 01/14/21 1922 01/14/21 2235 01/15/21 0427 01/15/21 1439 01/16/21 0235 01/17/21 0205  WBC 12.2*  --   --  9.2  --  8.4 9.4  LATICACIDVEN 2.9* 2.5* 5.5*  --  1.5  --   --     Liver Function Tests: Recent Labs  Lab 01/14/21 0915  01/16/21 0235  AST 57* 53*  ALT 36 19  ALKPHOS 171* 194*  BILITOT 0.4 0.5  PROT 6.3* 5.9*  ALBUMIN 1.8* 1.9*   No results for input(s): LIPASE, AMYLASE in the last 168 hours. Recent Labs  Lab 01/14/21 0915  AMMONIA 50*    ABG    Component Value Date/Time   PHART 7.435 01/14/2021 2041   PCO2ART 62.7 (H) 01/14/2021 2041   PO2ART 209 (H) 01/14/2021 2041   HCO3 42.0 (H) 01/14/2021 2041   TCO2 44 (H) 01/14/2021 2041   O2SAT 100.0 01/14/2021 2041     Coagulation Profile: No results for input(s): INR, PROTIME in the last 168 hours.  Cardiac Enzymes: No results for input(s): CKTOTAL, CKMB, CKMBINDEX, TROPONINI in the last 168 hours.  HbA1C: Hgb A1c MFr Bld  Date/Time Value Ref Range Status  12/20/2020 01:17 PM 7.2 (H) 4.8 - 5.6 % Final    Comment:    (NOTE) Pre diabetes:          5.7%-6.4%  Diabetes:              >6.4%  Glycemic control for   <7.0% adults with diabetes     CBG: Recent Labs  Lab 01/16/21 1520 01/16/21 1917 01/16/21 2337 01/17/21 0412 01/17/21 0727  GLUCAP 283* 299* 246* 80 72      Critical care time: 33    App cct 33 minutes  Magdalen Spatz, MSN, AGACNP-BC [place] for personal pager PCCM on call pager [phone] Hospital use only >> No outpatient use  01/17/2021, 9:37 AM

## 2021-01-18 ENCOUNTER — Inpatient Hospital Stay (HOSPITAL_COMMUNITY): Payer: Medicare Other

## 2021-01-18 DIAGNOSIS — Z9911 Dependence on respirator [ventilator] status: Secondary | ICD-10-CM | POA: Diagnosis not present

## 2021-01-18 DIAGNOSIS — J9621 Acute and chronic respiratory failure with hypoxia: Secondary | ICD-10-CM | POA: Diagnosis not present

## 2021-01-18 DIAGNOSIS — J80 Acute respiratory distress syndrome: Secondary | ICD-10-CM | POA: Diagnosis not present

## 2021-01-18 DIAGNOSIS — R739 Hyperglycemia, unspecified: Secondary | ICD-10-CM | POA: Diagnosis not present

## 2021-01-18 LAB — CBC
HCT: 27.9 % — ABNORMAL LOW (ref 36.0–46.0)
Hemoglobin: 8.7 g/dL — ABNORMAL LOW (ref 12.0–15.0)
MCH: 29.8 pg (ref 26.0–34.0)
MCHC: 31.2 g/dL (ref 30.0–36.0)
MCV: 95.5 fL (ref 80.0–100.0)
Platelets: 433 10*3/uL — ABNORMAL HIGH (ref 150–400)
RBC: 2.92 MIL/uL — ABNORMAL LOW (ref 3.87–5.11)
RDW: 16.6 % — ABNORMAL HIGH (ref 11.5–15.5)
WBC: 9.4 10*3/uL (ref 4.0–10.5)
nRBC: 0.7 % — ABNORMAL HIGH (ref 0.0–0.2)

## 2021-01-18 LAB — CULTURE, RESPIRATORY W GRAM STAIN: Culture: NORMAL

## 2021-01-18 LAB — COMPREHENSIVE METABOLIC PANEL
ALT: 12 U/L (ref 0–44)
AST: 25 U/L (ref 15–41)
Albumin: 2 g/dL — ABNORMAL LOW (ref 3.5–5.0)
Alkaline Phosphatase: 166 U/L — ABNORMAL HIGH (ref 38–126)
Anion gap: 7 (ref 5–15)
BUN: 37 mg/dL — ABNORMAL HIGH (ref 8–23)
CO2: 41 mmol/L — ABNORMAL HIGH (ref 22–32)
Calcium: 8.7 mg/dL — ABNORMAL LOW (ref 8.9–10.3)
Chloride: 93 mmol/L — ABNORMAL LOW (ref 98–111)
Creatinine, Ser: 0.73 mg/dL (ref 0.44–1.00)
GFR, Estimated: >60 mL/min (ref >60)
Glucose, Bld: 206 mg/dL — ABNORMAL HIGH (ref 70–99)
Potassium: 3.5 mmol/L (ref 3.5–5.1)
Sodium: 141 mmol/L (ref 135–145)
Total Bilirubin: 0.4 mg/dL (ref 0.3–1.2)
Total Protein: 5.8 g/dL — ABNORMAL LOW (ref 6.5–8.1)

## 2021-01-18 LAB — GLUCOSE, CAPILLARY
Glucose-Capillary: 152 mg/dL — ABNORMAL HIGH (ref 70–99)
Glucose-Capillary: 206 mg/dL — ABNORMAL HIGH (ref 70–99)
Glucose-Capillary: 217 mg/dL — ABNORMAL HIGH (ref 70–99)
Glucose-Capillary: 300 mg/dL — ABNORMAL HIGH (ref 70–99)
Glucose-Capillary: 307 mg/dL — ABNORMAL HIGH (ref 70–99)
Glucose-Capillary: 95 mg/dL (ref 70–99)

## 2021-01-18 LAB — MAGNESIUM: Magnesium: 1.9 mg/dL (ref 1.7–2.4)

## 2021-01-18 LAB — PHOSPHORUS: Phosphorus: 2.8 mg/dL (ref 2.5–4.6)

## 2021-01-18 MED ORDER — POTASSIUM CHLORIDE 20 MEQ PO PACK
40.0000 meq | PACK | Freq: Once | ORAL | Status: AC
Start: 2021-01-18 — End: 2021-01-18
  Administered 2021-01-18: 40 meq
  Filled 2021-01-18: qty 2

## 2021-01-18 MED ORDER — POTASSIUM CHLORIDE 20 MEQ PO PACK
40.0000 meq | PACK | Freq: Three times a day (TID) | ORAL | Status: AC
  Administered 2021-01-18 (×2): 40 meq
  Filled 2021-01-18 (×2): qty 2

## 2021-01-18 MED ORDER — INSULIN ASPART 100 UNIT/ML IJ SOLN
3.0000 [IU] | INTRAMUSCULAR | Status: DC
  Administered 2021-01-18 – 2021-01-19 (×8): 3 [IU] via SUBCUTANEOUS

## 2021-01-18 MED ORDER — MAGNESIUM SULFATE 2 GM/50ML IV SOLN
2.0000 g | Freq: Once | INTRAVENOUS | Status: AC
  Administered 2021-01-18: 2 g via INTRAVENOUS
  Filled 2021-01-18: qty 50

## 2021-01-18 MED ORDER — FUROSEMIDE 10 MG/ML IJ SOLN
40.0000 mg | Freq: Three times a day (TID) | INTRAMUSCULAR | Status: AC
  Administered 2021-01-18 – 2021-01-19 (×3): 40 mg via INTRAVENOUS
  Filled 2021-01-18 (×3): qty 4

## 2021-01-18 MED ORDER — APIXABAN 5 MG PO TABS
5.0000 mg | ORAL_TABLET | Freq: Two times a day (BID) | ORAL | Status: DC
  Administered 2021-01-18 – 2021-01-20 (×4): 5 mg
  Filled 2021-01-18 (×4): qty 1

## 2021-01-18 NOTE — Progress Notes (Signed)
K+ 3.5, Mg 1.9  Replaced per protocol  

## 2021-01-18 NOTE — Progress Notes (Signed)
NAME:  Lindsey Collins, MRN:  [id number], DOB:  [birth year], LOS: 4 ADMISSION DATE:  01/14/2021, CONSULTATION DATE:  5/3 REFERRING MD:  Dr. Rex Kras, CHIEF COMPLAINT:  Unresponsiveness   History of Present Illness:  75 yo F with PMH below which is significant for chronic vent dependent with trach, parkinson's, T2DM, CKD, HFpEF, Afib on eliquis and amio CC of multiple episodes of unresponsiveness. Resides at [facility]. Has had trach for past 2 years for MRSA pneumonia. March 2022 patient admitted for serratia pneumonia and discharged with Cefepime. Recently admitted to [facility] on 01/07/2021 with serratia pneumonia still present. Another course of Cefepime ordered and patient was later discharged 01/09/2021. While at [facility] patient was continued on Cefepime, however, patient was still not doing well. 01/12/2021 patient was requiring increased FiO2 requirement and hypoxic.   Patient presents 01/14/2021 to [facility] with multiple periods of unresponsiveness earlier this morning. Patient appears critically ill on home ventilator settings. Patient remains tachycardic in the 140s. CTA negative for PE, shows multifocal pneumonia versus asymmetric pulmonary edema. CXR shows several areas of edema vs. Infiltrate; worse in RUL. Pertinent Labs show Glucose 435, Hb 7.4, BNP 706, Lactic acid 2.9. 1 L of fluids total given to patient. Fluids on hold due to elevated BNP. Vanc and Cefepime started.  Pertinent  Medical History   has a past medical history of Acute on chronic respiratory failure with hypoxia ([place]), ARDS (adult respiratory distress syndrome) ([place]), Chronic kidney disease, stage II (mild), MRSA (methicillin resistant staphylococcus aureus) pneumonia ([place]), and Obstructive chronic bronchitis without exacerbation ([place]).  Significant Hospital Events: Including procedures, antibiotic start and stop dates in addition to other pertinent events   . 4/26-4/28 Admitted/Discharged from [facility] for Serratia pneumonia. Sent to  [facility] on Cefepime. . 5/3 Admitted to [facility] for periods of unresponsiveness and ARDS. Trach changed to 5 mm cuffed proximal XLT shiley.  . 5/4: Lactate increased from 2.5 to 5.5. Albumin 5% fluid bolus. Hb 6.1 overnight; 1 unit PRBC transfused.  . 5/6 HGB stable, CXR stable, increased sleepiness . 5/7 down to FiO2 40%  Interim History / Subjective:   She denies complaints. No pain or discomfort. Needs to be suctioned.  Objective   Blood pressure 127/80, pulse 87, temperature 98.3 F (36.8 C), temperature source Axillary, resp. rate (!) 27, weight 97.3 kg, SpO2 93 %.    Vent Mode: PRVC FiO2 (%):  [40 %-60 %] 40 % Set Rate:  [24 bmp] 24 bmp Vt Set:  [330 mL] 330 mL PEEP:  [8 cmH20] 8 cmH20 Plateau Pressure:  [24 cmH20-31 cmH20] 28 cmH20   Intake/Output Summary (Last 24 hours) at 01/18/2021 1429 Last data filed at 01/18/2021 1400 Gross per 24 hour  Intake 1335 ml  Output 1400 ml  Net -65 ml   Filed Weights   01/16/21 0500  Weight: 97.3 kg    Examination:  General: elderly woman sitting up in bed in NAD HEENT: [place]/AT, eyes anicteric Neck: trach in place, no bleeding Neuro: awake and alert, cough reflex intact, moving all extremities CV: RRR, no murmurs PULM: rhonchi cleared with suctioning, 1 mucus plug cleared. Synchronous with MV. Peak pressure around 30 GI: soft, NT. PEG in place without drainage or erythema. Extremities: ++ edema, no clubbing or cyanosis Skin: no rashes or ecchymoses    Labs/imaging that I havepersonally reviewed  (right click and "Reselect all SmartList Selections" daily)  Labs reviewed.  Potassium 3.5 BUN 37 Creatinine 1.7. WBC 9.4 H/H 8.7/27.9 CXR personally reviewed- increased opacification of  right upper lobe with cephalad shift of major fissure suggestive of right upper lobe mucous plug.  Tracheostomy tube in appropriate position.  Resolving left lower lobe opacity.  5/3 resp culture> normal flora  Resolved Hospital Problem list      Assessment & Plan:   ARDS due to presumed pneumonia-- culture normal flora but improving since being transitioned to meropenem. Acute on chronic hypoxic and hypercapneic respiratory failure with chronic vent dependence x 2 years. Recent pneumonia requiring hospitalization. Vent dependent at [facility]; Trach changed on 01/14/2021 to proximal XLT Serratia Pneumonia: culture on 4/26, on Cefepime since that time, clinically presenting with pneumonia. MRSA pcr + -Continue low tidal volume ventilation, 4 to 8 cc/kg ideal body weight with goal plateau less than 30 and driving pressure less than 15.  Tidal volume closer to 6 cc/min 8. - PAD protocol-monitor (patient has minimal requirements) - VAP prevention protocol - Routine trach care per protocol. - Titrate down FiO2 as able to maintain saturation >90% -increase diuresis. - Continue steroids to complete 5 days - Continue bronchodilators - Complete 7 days of appropriate antibiotics -- would complete 7 days of meropenem given her clinical improvement once switched into that.   -Continue chest PT, hypertonic saline nebs, bronchodilators - Likely can transfer back to [facility] soon as she is close to her baseline.  Hyperglycemia, uncontrolled Hx of DM2, started on steroids 01/15/2021 -con't long acting insulin -increase TF coverage; decrease when off steroids (last dose 5/8) -SSI PRN  Tachycardia Paroxsymal Afib: (on Eliquis at [facility]) Elevated BNP Hx HFpEF -con't amiodarone -restart eliquis -diuresis  Anemia, chronic -transfuse for Hb<7 or hemodynamically significant bleeding  Hypotension, resolved Lactic Acidosis with NAG, LA resolved Continue to hold heparin for now>> Continue PAS Hose  Transfuse per protocol Questionable source of bleeding if any  AKI , resolved H/o CKD -monitor renal function -renally dose meds, avoid nephrotoxic meds -strict I/O  Hx of Mood disorder, Bipolar, Parkinsons -Continue home meds  history of  hyperthyroidism  -Continue PTA PTU  Updated CM that she likely will be stable to return to [facility] soon.   Best practice (right click and "Reselect all SmartList Selections" daily)  Diet:  Tube Feed  Pain/Anxiety/Delirium protocol (if indicated): Yes (RASS goal -1,-2) VAP protocol (if indicated): Yes DVT prophylaxis: SCD GI prophylaxis: PPI Glucose control:  SSI Yes and Basal insulin Yes Central venous access:  N/A Arterial line:  N/A Foley:  N/A Mobility:  bed rest  PT consulted: N/A Last date of multidisciplinary goals of care discussion Code Status:  full code Disposition: ICU  Labs   CBC: Recent Labs  Lab 01/14/21 0915 01/14/21 1003 01/15/21 0427 01/15/21 1439 01/16/21 0235 01/17/21 0205 01/17/21 1112 01/18/21 0132  WBC 12.2*  --  9.2  --  8.4 9.4  --  9.4  NEUTROABS 10.7*  --   --   --   --   --   --   --   HGB 7.4*   < > 6.1* 8.9* 7.7* 9.0* 8.5* 8.7*  HCT 24.9*   < > 20.0* 28.9* 24.5* 28.7* 25.0* 27.9*  MCV 97.6  --  95.2  --  93.9 95.0  --  95.5  PLT 384  --  345  --  403* 448*  --  433*   < > = values in this interval not displayed.    Basic Metabolic Panel: Recent Labs  Lab 01/14/21 0915 01/14/21 1003 01/15/21 0427 01/16/21 0235 01/17/21 0205 01/17/21 1112 01/18/21 0132  NA  135   < > 138 138 144 141 141  K 4.4   < > 4.5 4.2 3.3* 4.7 3.5  CL 88*  --  92* 91* 95*  --  93*  CO2 39*  --  36* 37* 42*  --  41*  GLUCOSE 435*  --  151* 290* 132*  --  206*  BUN 31*  --  34* 48* 41*  --  37*  CREATININE 1.04*  --  1.21* 1.11* 0.78  --  0.73  CALCIUM 8.8*  --  8.5* 8.6* 9.1  --  8.7*  MG  --   --  1.8 2.4 2.4  --  1.9  PHOS  --   --  5.5* 4.7* 3.2  --  2.8   < > = values in this interval not displayed.    This patient is critically ill with multiple organ system failure which requires frequent high complexity decision making, assessment, support, evaluation, and titration of therapies. This was completed through the application of advanced monitoring  technologies and extensive interpretation of multiple databases. During this encounter critical care time was devoted to patient care services described in this note for 35 minutes.   Julian Hy, DO 01/18/21 5:17 PM [facility]

## 2021-01-19 DIAGNOSIS — J80 Acute respiratory distress syndrome: Secondary | ICD-10-CM | POA: Diagnosis not present

## 2021-01-19 DIAGNOSIS — J9621 Acute and chronic respiratory failure with hypoxia: Secondary | ICD-10-CM | POA: Diagnosis not present

## 2021-01-19 DIAGNOSIS — Z9911 Dependence on respirator [ventilator] status: Secondary | ICD-10-CM | POA: Diagnosis not present

## 2021-01-19 DIAGNOSIS — J9622 Acute and chronic respiratory failure with hypercapnia: Secondary | ICD-10-CM | POA: Diagnosis not present

## 2021-01-19 LAB — CBC
HCT: 31.5 % — ABNORMAL LOW (ref 36.0–46.0)
Hemoglobin: 9.7 g/dL — ABNORMAL LOW (ref 12.0–15.0)
MCH: 29.5 pg (ref 26.0–34.0)
MCHC: 30.8 g/dL (ref 30.0–36.0)
MCV: 95.7 fL (ref 80.0–100.0)
Platelets: 449 10*3/uL — ABNORMAL HIGH (ref 150–400)
RBC: 3.29 MIL/uL — ABNORMAL LOW (ref 3.87–5.11)
RDW: 17 % — ABNORMAL HIGH (ref 11.5–15.5)
WBC: 9.2 10*3/uL (ref 4.0–10.5)
nRBC: 0.7 % — ABNORMAL HIGH (ref 0.0–0.2)

## 2021-01-19 LAB — CULTURE, BLOOD (ROUTINE X 2)
Culture: NO GROWTH
Culture: NO GROWTH

## 2021-01-19 LAB — GLUCOSE, CAPILLARY
Glucose-Capillary: 205 mg/dL — ABNORMAL HIGH (ref 70–99)
Glucose-Capillary: 205 mg/dL — ABNORMAL HIGH (ref 70–99)
Glucose-Capillary: 282 mg/dL — ABNORMAL HIGH (ref 70–99)
Glucose-Capillary: 311 mg/dL — ABNORMAL HIGH (ref 70–99)
Glucose-Capillary: 237 mg/dL — ABNORMAL HIGH (ref 70–99)
Glucose-Capillary: 243 mg/dL — ABNORMAL HIGH (ref 70–99)

## 2021-01-19 LAB — PHOSPHORUS: Phosphorus: 3.1 mg/dL (ref 2.5–4.6)

## 2021-01-19 LAB — BASIC METABOLIC PANEL
Anion gap: 9 (ref 5–15)
BUN: 35 mg/dL — ABNORMAL HIGH (ref 8–23)
CO2: 39 mmol/L — ABNORMAL HIGH (ref 22–32)
Calcium: 8.9 mg/dL (ref 8.9–10.3)
Chloride: 96 mmol/L — ABNORMAL LOW (ref 98–111)
Creatinine, Ser: 0.79 mg/dL (ref 0.44–1.00)
GFR, Estimated: >60 mL/min (ref >60)
Glucose, Bld: 204 mg/dL — ABNORMAL HIGH (ref 70–99)
Potassium: 4.9 mmol/L (ref 3.5–5.1)
Sodium: 144 mmol/L (ref 135–145)

## 2021-01-19 LAB — SARS CORONAVIRUS 2 (TAT 6-24 HRS): SARS Coronavirus 2: NEGATIVE

## 2021-01-19 LAB — MAGNESIUM: Magnesium: 2.2 mg/dL (ref 1.7–2.4)

## 2021-01-19 MED ORDER — WHITE PETROLATUM EX OINT
TOPICAL_OINTMENT | CUTANEOUS | Status: AC
  Filled 2021-01-19: qty 28.35

## 2021-01-19 MED ORDER — FUROSEMIDE 10 MG/ML IJ SOLN
60.0000 mg | Freq: Three times a day (TID) | INTRAMUSCULAR | Status: AC
  Administered 2021-01-19 – 2021-01-20 (×3): 60 mg via INTRAVENOUS
  Filled 2021-01-19 (×3): qty 6

## 2021-01-19 MED ORDER — INSULIN GLARGINE 100 UNIT/ML ~~LOC~~ SOLN
22.0000 [IU] | Freq: Every day | SUBCUTANEOUS | Status: DC
  Administered 2021-01-19: 22 [IU] via SUBCUTANEOUS
  Filled 2021-01-19 (×2): qty 0.22

## 2021-01-19 NOTE — Plan of Care (Signed)

## 2021-01-19 NOTE — Progress Notes (Signed)
covid test required to return to [facility] has been ordered per CM's request.  Julian Hy, DO 01/19/21 4:13 PM [facility]

## 2021-01-19 NOTE — Progress Notes (Addendum)
Pharmacy Antibiotic Note  Lindsey Collins is a 75 y.o. female admitted on 01/14/2021 from [facility] with altered mental status and periods of non-responsiveness.  Patient is chronically vent-dependent and has a trach.  Pharmacy has been consulted for meropenem dosing. Patient has been receiving meropenem 1g IV q8h - total is day 4 of meropenem, day 6 total antibiotics.   Plan: Continue Meropenem 1g IV q8 hours  Follow up length of therapy  Weight: 96.4 kg (212 lb 8.4 oz)  Temp (24hrs), Avg:98.8 F (37.1 C), Min:98.2 F (36.8 C), Max:99.3 F (37.4 C)  Recent Labs  Lab 01/14/21 0915 01/14/21 1922 01/14/21 2235 01/15/21 0427 01/15/21 1439 01/16/21 0235 01/17/21 0205 01/18/21 0132 01/19/21 0050  WBC 12.2*  --   --  9.2  --  8.4 9.4 9.4 9.2  CREATININE 1.04*  --   --  1.21*  --  1.11* 0.78 0.73 0.79  LATICACIDVEN 2.9* 2.5* 5.5*  --  1.5  --   --   --   --     Estimated Creatinine Clearance: 69.5 mL/min (by C-G formula based on SCr of 0.79 mg/dL).    No Known Allergies  Antimicrobials this admission: Cefepime (cont from PTA) 4/26 >> 5/4 Vancomycin 5/3 >> 5/4 Meropenem 5/5 >>  Microbiology results: 5/3 BCx: negative 5/4 UCx: multiple species 5/3 Sputum: mod gram neg rods - normal flora  5/4 MRSA PCR: positive  Thank you for allowing pharmacy to be a part of this patient's care. Cristela Felt, PharmD Clinical Pharmacist  01/19/2021 11:54 AM

## 2021-01-19 NOTE — Discharge Summary (Signed)
Physician Discharge Summary  Patient ID: Lindsey Collins MRN: [id number] DOB/AGE: 75-26-1947 75 y.o.  Admit date: 01/14/2021 Discharge date: 01/20/21  Admission Diagnoses: Acute on chronic respiratory failure with hypoxia and hypercapnia COPD Chronic trach and ventilator dependence ARDS Bilateral pneumonia Lactic acidosis Sepsis due to pneumonia Parkinson's disease Hyperthyroidism Atrial fibrillation with RVR Chronic anticoagulation Chronic HFpEF Chronic anemia History of bipolar disorder AKI on CKD 2  Discharge Diagnoses:  Active Problems:   ARDS (adult respiratory distress syndrome) (HCC) Acute on chronic respiratory failure with hypoxia and hypercapnia COPD Chronic trach and ventilator dependence ARDS Bilateral pneumonia AKI on CKD2 Lactic acidosis Sepsis due to pneumonia Parkinson's disease History of bipolar disorder Hyperthyroidism Atrial fibrillation with RVR, paroxysmal Afib Chronic anticoagulation Hyperglycemia due to steroids Chronic HFpEF Chronic anemia    Discharged Condition: stable  Hospital Course:  Lindsey Collins is a 75 y/o woman with a history of chronic trach and vent dependence due to COPD who presented to the hospital with ARDS and sepsis due to pneumonia after a recent admission for Serratia pneumonia. She was initially treated with IVF and subsequently required diuresis. She was started on cefepime but was later escalated to meropenem and began to show a more significant improvement at that time. She required a trach change to a proximal XLT in the ED due to high peak pressures in the vent. She had some bleeding from her trach site and her anticoagulation was subsequently held until this resolved. She tolerating it restarting without bleeding. She had Afib with RVR that improve with sepsis treatment and was continued on her home regimen. She remained NPO and continued with TF via PEG at goal during this admission. She was continued on her home  medications for hyperthyroidism, Parkinson's disease, and bipolar disorder. She had slow improvement in oxygen requirements, initially requiring 80-100% FiO2, down to 40% now.   On 5/9 She was tolerating pressure support ventilation, awake and alert and overall improving. Accepted back at [place].  Last dose of Meropenem today  Consults:  PCCM  Significant Diagnostic Studies:  radiology: 5/3  CT scan: No sign of PE, multifocal PNA   Treatments:  Mechanical ventilation and respiratory support, bronchodilators Trach exchange to Shiley proximal XLT #5 cuffed Antibiotics IVF Insulin Blood transfusion ( 1 unit pRBC) Home medications continued diuresis  Discharge Exam: Blood pressure (!) 114/33, pulse 96, temperature 98.1 F (36.7 C), temperature source Axillary, resp. rate (!) 32, weight 96.4 kg, SpO2 (!) 61 %.    General:  eldlerly F, intubated, awake and in no distress HEENT: MM pink/moist, Shiley XLT trach in place without bleeding Neuro: awake, nodding to questions, moving extremities to commands CV: s1s2 rrr, no m/r/g PULM:  Mechanical breath sounds bilaterally, tolerating pressure support ventilation without hypoxia GI: soft, bsx4 active  Extremities: warm/dry, 2+ pedeal edema  Skin: no rashes or lesions   Disposition:  There are no questions and answers to display.         Allergies as of 01/20/2021   No Known Allergies     Medication List    STOP taking these medications   ceFEPIme 2 g in sodium chloride 0.9 % 100 mL     TAKE these medications   allopurinol 100 MG tablet Commonly known as: ZYLOPRIM Take 200 mg by mouth daily.   amiodarone 200 MG tablet Commonly known as: PACERONE Take 200 mg by mouth daily.   atorvastatin 20 MG tablet Commonly known as: LIPITOR Take 20 mg by mouth at bedtime.  carbidopa-levodopa 25-100 MG tablet Commonly known as: SINEMET IR Take 1 tablet by mouth every 6 (six) hours. Times given : 00:00, 0600; 12:00, 1800    chlorhexidine 0.12 % solution Commonly known as: PERIDEX Use as directed 15 mLs in the mouth or throat 2 (two) times daily.   cholecalciferol 25 MCG (1000 UNIT) tablet Commonly known as: VITAMIN D3 Take 1,000 Units by mouth daily.   divalproex 125 MG capsule Commonly known as: DEPAKOTE SPRINKLE Take 500 mg by mouth 2 (two) times daily.   Eliquis 5 MG Tabs tablet Generic drug: apixaban Take 5 mg by mouth 2 (two) times daily.   feeding supplement (GLUCERNA 1.2 CAL) Liqd Place 1,000 mLs into feeding tube continuous. 60 ml /hr 100q4   FLUoxetine 10 MG tablet Commonly known as: PROZAC Take 10 mg by mouth daily.   furosemide 20 MG tablet Commonly known as: LASIX Take 20 mg by mouth every 12 (twelve) hours.   guaiFENesin 200 MG tablet Take 400 mg by mouth every 8 (eight) hours as needed for cough or to loosen phlegm.   insulin aspart 100 UNIT/ML injection Commonly known as: novoLOG Inject 0-10 Units into the skin every 6 (six) hours. Sliding scale: CBG 150 = 0 units, 151-200 = 2 units, 201-250 = 4 units, 251-300 = 6 units, 301-350 = 8 units, 351-400 = 10 units.   insulin glargine 100 UNIT/ML injection Commonly known as: LANTUS Inject 44 Units into the skin at bedtime.   ipratropium-albuterol 0.5-2.5 (3) MG/3ML Soln Commonly known as: DUONEB Take 3 mLs by nebulization every 4 (four) hours as needed (shortness of breath).   magnesium oxide 400 MG tablet Commonly known as: MAG-OX Take 400 mg by mouth 2 (two) times daily.   melatonin 3 MG Tabs tablet Take 6 mg by mouth at bedtime.   memantine 5 MG tablet Commonly known as: NAMENDA Take 5 mg by mouth 2 (two) times daily.   metoprolol tartrate 25 MG tablet Commonly known as: LOPRESSOR Take 25 mg by mouth 2 (two) times daily.   OLANZapine 10 MG tablet Commonly known as: ZYPREXA Take 10 mg by mouth at bedtime.   pantoprazole sodium 40 mg/20 mL Pack Commonly known as: PROTONIX Take 40 mg by mouth daily.    polyethylene glycol 17 g packet Commonly known as: MIRALAX / GLYCOLAX Take 17 g by mouth daily.   Potassium Chloride 40 MEQ/15ML (20%) Soln Take 40 mEq by mouth daily.   pramipexole 0.125 MG tablet Commonly known as: MIRAPEX Take 0.125 mg by mouth every 8 (eight) hours.   propylthiouracil 50 MG tablet Commonly known as: PTU Take 50 mg by mouth every 12 (twelve) hours.   senna-docusate 8.6-50 MG tablet Commonly known as: Senokot-S Take 2 tablets by mouth at bedtime.        Signed:   Otilio Carpen Reymond Maynez, PA-C [facility] See Amion for pager If no response to pager , please call [phone] until 7pm After 7:00 pm call Elink  [phone]?[place]

## 2021-01-19 NOTE — Progress Notes (Signed)
NAME:  Lindsey Collins, MRN:  [id number], DOB:  1945/11/27, LOS: 5 ADMISSION DATE:  01/14/2021, CONSULTATION DATE:  5/3 REFERRING MD:  Dr. Rex Kras, CHIEF COMPLAINT:  Unresponsiveness   History of Present Illness:  75 yo F with PMH below which is significant for chronic vent dependent with trach, parkinson's, T2DM, CKD, HFpEF, Afib on eliquis and amio CC of multiple episodes of unresponsiveness. Resides at [facility] Has had trach for past 2 years for MRSA pneumonia. March 2022 patient admitted for serratia pneumonia and discharged with Cefepime. Recently admitted to [facility] on 01/07/2021 with serratia pneumonia still present. Another course of Cefepime ordered and patient was later discharged 01/09/2021. While at [facility] patient was continued on Cefepime, however, patient was still not doing well. 01/12/2021 patient was requiring increased FiO2 requirement and hypoxic.   Patient presents 01/14/2021 to [facility] with multiple periods of unresponsiveness earlier this morning. Patient appears critically ill on home ventilator settings. Patient remains tachycardic in the 140s. CTA negative for PE, shows multifocal pneumonia versus asymmetric pulmonary edema. CXR shows several areas of edema vs. Infiltrate; worse in RUL. Pertinent Labs show Glucose 435, Hb 7.4, BNP 706, Lactic acid 2.9. 1 L of fluids total given to patient. Fluids on hold due to elevated BNP. Vanc and Cefepime started.  Pertinent  Medical History   has a past medical history of Acute on chronic respiratory failure with hypoxia ([place]), ARDS (adult respiratory distress syndrome) ([place]), Chronic kidney disease, stage II (mild), MRSA (methicillin resistant staphylococcus aureus) pneumonia ([place]), and Obstructive chronic bronchitis without exacerbation ([place]).  Significant Hospital Events: Including procedures, antibiotic start and stop dates in addition to other pertinent events   . 4/26-4/28 Admitted/Discharged from [facility] for Serratia pneumonia. Sent to  [facility] on Cefepime. . 5/3 Admitted to [facility] for periods of unresponsiveness and ARDS. Trach changed to 5 mm cuffed proximal XLT shiley.  . 5/4: Lactate increased from 2.5 to 5.5. Albumin 5% fluid bolus. Hb 6.1 overnight; 1 unit PRBC transfused.  . 5/6 HGB stable, CXR stable, increased sleepiness . 5/7 down to FiO2 40%  Interim History / Subjective:  This morning she denies complaints.  Objective   Blood pressure (!) 99/53, pulse 73, temperature 98.7 F (37.1 C), temperature source Oral, resp. rate (!) 24, weight 96.4 kg, SpO2 95 %.    Vent Mode: PRVC FiO2 (%):  [40 %] 40 % Set Rate:  [24 bmp] 24 bmp Vt Set:  [330 mL] 330 mL PEEP:  [8 cmH20] 8 cmH20 Plateau Pressure:  [20 cmH20-35 cmH20] 22 cmH20   Intake/Output Summary (Last 24 hours) at 01/19/2021 1327 Last data filed at 01/19/2021 1200 Gross per 24 hour  Intake 1680 ml  Output 2850 ml  Net -1170 ml   Filed Weights   01/16/21 0500 01/19/21 0356  Weight: 97.3 kg 96.4 kg    Examination:  General: elderly woman sitting up in bed in NAD, Trached on vent. HEENT: [place]/AT, eyes anicteric Neck: Shiley prox XLT #5 without surrounding bleeding Neuro: awake, alert, trying to talk. Moving all extremities. CV: S1S2, RRR PULM: breathing synchronously with MV GI: soft, NT, ND. PEG without erythema or drainage. Extremities: chronic pedal, dependent edema. Skin: pallor, no rashes or ecchymoses.    Labs/imaging that I have personally reviewed  (right click and "Reselect all SmartList Selections" daily)  Labs reviewed.   K+ 4.9 BUN 35 Creatinine 0.79 WBC 9.7 H/H 9.7/31.9  5/4 urine- polymicrobial 5/3 resp culture> normal flora  Resolved Hospital Problem list  Assessment & Plan:   ARDS due to presumed pneumonia-- culture normal flora but improving since being transitioned to meropenem. Acute on chronic hypoxic and hypercapneic respiratory failure with chronic vent dependence x 2 years. Recent pneumonia requiring  hospitalization. Vent dependent at [facility]; Lurline Idol changed from portex on 01/14/2021 to [facility] proximal XLT #5 Previous Serratia Pneumonia: culture on 4/26, on Cefepime since that time, clinically presenting with pneumonia. MRSA pcr + -LTVV, 4-8cc/kg IBW, goal Pplat<30 and DP<15 - PAD protocol for sedation; minimal requirements - VAP prevention protocol - Routine trach care - Titrate down FiO2 as able to maintain saturation >90%> down to 40% -Con't diuresis> increased to 60mg  Q8h; goal net negative fluid balance - Last day of steroids today - Continue nebulized bronchodilators. - Complete 7 days of appropriate antibiotics -- would complete 7 days of meropenem given her clinical improvement once switched to that.   -Continue chest PT, hypertonic saline nebs, bronchodilators.  - Stable to transfer back to [facility] soon. Updated CM who will contact [facility].  Hyperglycemia, uncontrolled Hx of DM2, started on steroids 01/15/2021 -con't long acting insulin> decreased to 22 units daily -stop TF coverage -SSI PRN -goal BG 140-180  Tachycardia Paroxsymal Afib: (on Eliquis at [facility]) Elevated BNP Hx HFpEF -con't amiodarone daily -restart eliquis -diuresis- increase lasix  Anemia, chronic -transfuse for Hb<7 or hemodynamically significant bleeding -ok to restart Eliquis  AKI , resolved H/o CKD -monitor renal function -renally dose meds, avoid nephrotoxic meds -strict I/Os  Hx of Mood disorder, Bipolar, Parkinsons -Continue PTA meds  history of hyperthyroidism  -Continue PTA PTU   Best practice (right click and "Reselect all SmartList Selections" daily)  Diet:  Tube Feed  Pain/Anxiety/Delirium protocol (if indicated): Yes (RASS goal -1,-2) VAP protocol (if indicated): Yes DVT prophylaxis: SCD GI prophylaxis: PPI Glucose control:  SSI Yes and Basal insulin Yes Central venous access:  N/A Arterial line:  N/A Foley:  N/A Mobility:  bed rest  PT consulted: N/A Last date of  multidisciplinary goals of care discussion Code Status:  full code Disposition: ICU  Labs   CBC: Recent Labs  Lab 01/14/21 0915 01/14/21 1003 01/15/21 0427 01/15/21 1439 01/16/21 0235 01/17/21 0205 01/17/21 1112 01/18/21 0132 01/19/21 0050  WBC 12.2*  --  9.2  --  8.4 9.4  --  9.4 9.2  NEUTROABS 10.7*  --   --   --   --   --   --   --   --   HGB 7.4*   < > 6.1*   < > 7.7* 9.0* 8.5* 8.7* 9.7*  HCT 24.9*   < > 20.0*   < > 24.5* 28.7* 25.0* 27.9* 31.5*  MCV 97.6  --  95.2  --  93.9 95.0  --  95.5 95.7  PLT 384  --  345  --  403* 448*  --  433* 449*   < > = values in this interval not displayed.    Basic Metabolic Panel: Recent Labs  Lab 01/15/21 0427 01/16/21 0235 01/17/21 0205 01/17/21 1112 01/18/21 0132 01/19/21 0050  NA 138 138 144 141 141 144  K 4.5 4.2 3.3* 4.7 3.5 4.9  CL 92* 91* 95*  --  93* 96*  CO2 36* 37* 42*  --  41* 39*  GLUCOSE 151* 290* 132*  --  206* 204*  BUN 34* 48* 41*  --  37* 35*  CREATININE 1.21* 1.11* 0.78  --  0.73 0.79  CALCIUM 8.5* 8.6* 9.1  --  8.7*  8.9  MG 1.8 2.4 2.4  --  1.9 2.2  PHOS 5.5* 4.7* 3.2  --  2.8 3.1    This patient is critically ill with multiple organ system failure which requires frequent high complexity decision making, assessment, support, evaluation, and titration of therapies. This was completed through the application of advanced monitoring technologies and extensive interpretation of multiple databases. During this encounter critical care time was devoted to patient care services described in this note for 33 minutes.   Julian Hy, DO 01/19/21 2:18 PM [facility]

## 2021-01-19 NOTE — TOC Progression Note (Addendum)
Transition of Care [facility]) - Progression Note    Patient Details  Name: Lindsey Collins MRN: [id number] Date of Birth: 1946/03/13  Transition of Care [facility]) CM/SW Contact  Bartholomew Crews, RN Phone Number: [phone] 01/19/2021, 4:02 PM  Clinical Narrative:     Notified by MD that patient is ready to transition back to [facility]. Attempted call to [facility] nurses station - no answer. Left voicemail for admissions.   Spoke with patient's daughter, Lindsey Collins, on the phone to discuss plan for patient to return to [facility] pending bed availability. Covid test requested.       Expected Discharge Plan and Services                                                 Social Determinants of Health (SDOH) Interventions    Readmission Risk Interventions No flowsheet data found.

## 2021-01-20 DIAGNOSIS — J9621 Acute and chronic respiratory failure with hypoxia: Secondary | ICD-10-CM | POA: Diagnosis not present

## 2021-01-20 DIAGNOSIS — J9622 Acute and chronic respiratory failure with hypercapnia: Secondary | ICD-10-CM | POA: Diagnosis not present

## 2021-01-20 DIAGNOSIS — J159 Unspecified bacterial pneumonia: Secondary | ICD-10-CM | POA: Diagnosis not present

## 2021-01-20 LAB — CBC
HCT: 30.7 % — ABNORMAL LOW (ref 36.0–46.0)
Hemoglobin: 9.3 g/dL — ABNORMAL LOW (ref 12.0–15.0)
MCH: 29.1 pg (ref 26.0–34.0)
MCHC: 30.3 g/dL (ref 30.0–36.0)
MCV: 95.9 fL (ref 80.0–100.0)
Platelets: 421 10*3/uL — ABNORMAL HIGH (ref 150–400)
RBC: 3.2 MIL/uL — ABNORMAL LOW (ref 3.87–5.11)
RDW: 17.2 % — ABNORMAL HIGH (ref 11.5–15.5)
WBC: 10.1 10*3/uL (ref 4.0–10.5)
nRBC: 0.7 % — ABNORMAL HIGH (ref 0.0–0.2)

## 2021-01-20 LAB — BASIC METABOLIC PANEL
Anion gap: 9 (ref 5–15)
BUN: 36 mg/dL — ABNORMAL HIGH (ref 8–23)
CO2: 41 mmol/L — ABNORMAL HIGH (ref 22–32)
Calcium: 8.6 mg/dL — ABNORMAL LOW (ref 8.9–10.3)
Chloride: 94 mmol/L — ABNORMAL LOW (ref 98–111)
Creatinine, Ser: 0.81 mg/dL (ref 0.44–1.00)
GFR, Estimated: >60 mL/min (ref >60)
Glucose, Bld: 259 mg/dL — ABNORMAL HIGH (ref 70–99)
Potassium: 3.4 mmol/L — ABNORMAL LOW (ref 3.5–5.1)
Sodium: 144 mmol/L (ref 135–145)

## 2021-01-20 LAB — GLUCOSE, CAPILLARY
Glucose-Capillary: 254 mg/dL — ABNORMAL HIGH (ref 70–99)
Glucose-Capillary: 219 mg/dL — ABNORMAL HIGH (ref 70–99)
Glucose-Capillary: 256 mg/dL — ABNORMAL HIGH (ref 70–99)

## 2021-01-20 MED ORDER — INSULIN GLARGINE 100 UNIT/ML ~~LOC~~ SOLN
28.0000 [IU] | Freq: Every day | SUBCUTANEOUS | Status: DC
  Filled 2021-01-20: qty 0.28

## 2021-01-20 MED ORDER — FUROSEMIDE 10 MG/ML IJ SOLN
60.0000 mg | Freq: Three times a day (TID) | INTRAMUSCULAR | Status: DC
  Administered 2021-01-20: 60 mg via INTRAVENOUS
  Filled 2021-01-20: qty 6

## 2021-01-20 NOTE — TOC Progression Note (Signed)
Transition of Care [facility]) - Progression Note    Patient Details  Name: Lindsey Collins MRN: [id number] Date of Birth: [birth year]  Transition of Care [facility]) CM/SW Contact  Bartholomew Crews, RN Phone Number: [phone] 01/20/2021, 8:00 AM  Clinical Narrative:     Left voicemail this morning for [facility] regarding bed availability for return today. Pending call back. Covid result faxed to [facility] at [phone].        Expected Discharge Plan and Services                                                 Social Determinants of Health (SDOH) Interventions    Readmission Risk Interventions No flowsheet data found.

## 2021-01-20 NOTE — Plan of Care (Signed)
  Problem: Education: Goal: Knowledge of General Education information will improve Description: Including pain rating scale, medication(s)/side effects and non-pharmacologic comfort measures 01/20/2021 1110 by Luan Moore, RN Outcome: Completed/Met 01/20/2021 0746 by Luan Moore, RN Outcome: Progressing   Problem: Health Behavior/Discharge Planning: Goal: Ability to manage health-related needs will improve 01/20/2021 1110 by Luan Moore, RN Outcome: Completed/Met 01/20/2021 0746 by Luan Moore, RN Outcome: Progressing   Problem: Clinical Measurements: Goal: Ability to maintain clinical measurements within normal limits will improve 01/20/2021 1110 by Luan Moore, RN Outcome: Completed/Met 01/20/2021 0746 by Luan Moore, RN Outcome: Progressing Goal: Will remain free from infection 01/20/2021 1110 by Luan Moore, RN Outcome: Completed/Met 01/20/2021 0746 by Luan Moore, RN Outcome: Progressing Goal: Diagnostic test results will improve 01/20/2021 1110 by Luan Moore, RN Outcome: Completed/Met 01/20/2021 0746 by Luan Moore, RN Outcome: Progressing Goal: Respiratory complications will improve 01/20/2021 1110 by Luan Moore, RN Outcome: Completed/Met 01/20/2021 0746 by Luan Moore, RN Outcome: Progressing Goal: Cardiovascular complication will be avoided 01/20/2021 1110 by Luan Moore, RN Outcome: Completed/Met 01/20/2021 0746 by Luan Moore, RN Outcome: Progressing   Problem: Activity: Goal: Risk for activity intolerance will decrease 01/20/2021 1110 by Luan Moore, RN Outcome: Completed/Met 01/20/2021 0746 by Luan Moore, RN Outcome: Progressing   Problem: Nutrition: Goal: Adequate nutrition will be maintained 01/20/2021 1110 by Luan Moore, RN Outcome: Completed/Met 01/20/2021 0746 by Luan Moore, RN Outcome: Progressing   Problem: Coping: Goal: Level of anxiety will  decrease 01/20/2021 1110 by Luan Moore, RN Outcome: Completed/Met 01/20/2021 0746 by Luan Moore, RN Outcome: Progressing   Problem: Elimination: Goal: Will not experience complications related to bowel motility 01/20/2021 1110 by Luan Moore, RN Outcome: Completed/Met 01/20/2021 0746 by Luan Moore, RN Outcome: Progressing Goal: Will not experience complications related to urinary retention 01/20/2021 1110 by Luan Moore, RN Outcome: Completed/Met 01/20/2021 0746 by Luan Moore, RN Outcome: Progressing   Problem: Pain Managment: Goal: General experience of comfort will improve 01/20/2021 1110 by Luan Moore, RN Outcome: Completed/Met 01/20/2021 0746 by Luan Moore, RN Outcome: Progressing   Problem: Safety: Goal: Ability to remain free from injury will improve 01/20/2021 1110 by Luan Moore, RN Outcome: Completed/Met 01/20/2021 0746 by Luan Moore, RN Outcome: Progressing   Problem: Skin Integrity: Goal: Risk for impaired skin integrity will decrease 01/20/2021 1110 by Luan Moore, RN Outcome: Completed/Met 01/20/2021 0746 by Luan Moore, RN Outcome: Progressing   Problem: Activity: Goal: Ability to tolerate increased activity will improve 01/20/2021 1110 by Luan Moore, RN Outcome: Completed/Met 01/20/2021 0746 by Luan Moore, RN Outcome: Progressing   Problem: Respiratory: Goal: Ability to maintain a clear airway and adequate ventilation will improve 01/20/2021 1110 by Luan Moore, RN Outcome: Completed/Met 01/20/2021 0746 by Luan Moore, RN Outcome: Progressing   Problem: Role Relationship: Goal: Method of communication will improve 01/20/2021 1110 by Luan Moore, RN Outcome: Completed/Met 01/20/2021 0746 by Luan Moore, RN Outcome: Progressing

## 2021-01-20 NOTE — Progress Notes (Signed)
Report called to Alberteen Sam, RN @ [facility] @ [phone]. Daughter, Adrieanna Boteler, notified of patient's transfer back to [facility] at number provided. PIV in left hand left in place per [facility] case management and [facility] RN request. Personal belongings including flowers and card sent with patient.

## 2021-01-20 NOTE — Plan of Care (Signed)

## 2021-01-20 NOTE — Progress Notes (Signed)
NAME:  Lindsey Collins, MRN:  [id number], DOB:  1946-08-28, LOS: 6 ADMISSION DATE:  01/14/2021, CONSULTATION DATE:  5/3 REFERRING MD:  Dr. Rex Kras, CHIEF COMPLAINT:  Unresponsiveness   History of Present Illness:  75 yo F with PMH below which is significant for chronic vent dependent with trach, parkinson's, T2DM, CKD, HFpEF, Afib on eliquis and amio CC of multiple episodes of unresponsiveness. Resides at [facility]. Has had trach for past 2 years for MRSA pneumonia. March 2022 patient admitted for serratia pneumonia and discharged with Cefepime. Recently admitted to [facility] on 01/07/2021 with serratia pneumonia still present. Another course of Cefepime ordered and patient was later discharged 01/09/2021. While at [facility] patient was continued on Cefepime, however, patient was still not doing well. 01/12/2021 patient was requiring increased FiO2 requirement and hypoxic.   Patient presents 01/14/2021 to [facility] with multiple periods of unresponsiveness earlier this morning. Patient appears critically ill on home ventilator settings. Patient remains tachycardic in the 140s. CTA negative for PE, shows multifocal pneumonia versus asymmetric pulmonary edema. CXR shows several areas of edema vs. Infiltrate; worse in RUL. Pertinent Labs show Glucose 435, Hb 7.4, BNP 706, Lactic acid 2.9. 1 L of fluids total given to patient. Fluids on hold due to elevated BNP. Vanc and Cefepime started.  Pertinent  Medical History   has a past medical history of Acute on chronic respiratory failure with hypoxia ([place]), ARDS (adult respiratory distress syndrome) ([place]), Chronic kidney disease, stage II (mild), MRSA (methicillin resistant staphylococcus aureus) pneumonia ([place]), and Obstructive chronic bronchitis without exacerbation ([place]).  Significant Hospital Events: Including procedures, antibiotic start and stop dates in addition to other pertinent events   . 4/26-4/28 Admitted/Discharged from [facility] for Serratia pneumonia. Sent to  [facility] on Cefepime. . 5/3 Admitted to [facility] for periods of unresponsiveness and ARDS. Trach changed to 5 mm cuffed proximal XLT shiley.  . 5/4: Lactate increased from 2.5 to 5.5. Albumin 5% fluid bolus. Hb 6.1 overnight; 1 unit PRBC transfused.  . 5/6 HGB stable, CXR stable, increased sleepiness . 5/7 down to FiO2 40% . 5/9 Awake and tolerating PSV, though borderline low MV  Interim History / Subjective:   Tolerating PSV on Vent, awake and responsive and appears comfortable  Objective   Blood pressure 124/63, pulse 81, temperature 98.1 F (36.7 C), resp. rate (!) 26, weight 95 kg, SpO2 98 %.    Vent Mode: PSV;CPAP FiO2 (%):  [40 %] 40 % Set Rate:  [24 bmp] 24 bmp Vt Set:  [330 mL] 330 mL PEEP:  [8 cmH20] 8 cmH20 Pressure Support:  [18 cmH20] 18 cmH20 Plateau Pressure:  [21 cmH20-25 cmH20] 21 cmH20   Intake/Output Summary (Last 24 hours) at 01/20/2021 2440 Last data filed at 01/20/2021 0700 Gross per 24 hour  Intake 1919.85 ml  Output 1850 ml  Net 69.85 ml   Filed Weights   01/16/21 0500 01/19/21 0356 01/20/21 0500  Weight: 97.3 kg 96.4 kg 95 kg    Examination:    General: Elderly female, awake, nodding to questions, on trach and in no acute distress HEENT: MM pink/moist, Shiley XLT #5 trach in place, no bleeding or copious secretions, sclera anicteric, pupils equal Neuro: Weak, nodding to questions, moving extremities, no obvious focal deficits CV: s1s2 RRR, no m/r/g PULM:, On pressure support ventilation, trach in place, lungs clear without rhonchi or wheezing, good oxygen saturations on 40% FiO2 GI: soft, bsx4 active, PEG in place Extremities: warm/dry, 2+ pedal edema  Skin: no rashes or lesions  Labs/imaging that I have personally reviewed  (right click and "Reselect all SmartList Selections" daily)   CBC-hemoglobin stable, no leukocytosis Glucose 200-250  MRSA screen>> positive Urine culture>> multiple species  Resolved Hospital Problem list    AKI  Assessment & Plan:   Acute on chronic hypoxic and hypercapnic Respiratory failure in the setting of chronic vent dependence and secondary to ARDS due to presumed pneumonia Improving on meropenem Respiratory culture with normal respiratory flora Trach was changed on 5/3 Shiley proximal XLT #5 due to elevated vent pressures Chronically vent dependent at [facility] at baseline Previous Serratia pneumonia at the end of April P: -Stable for transfer back to [facility], case management consulted -Tolerating pressure support this morning, continue SBT -Routine trach care -Continue diuresis -Completed course of steroids -Finish 7-day course of antibiotics, currently on meropenem last day today. -Continued nebulizers and chest PT with hypertonic saline nebs --Maintain full vent support with SAT/SBT as tolerated -titrate Vent setting to maintain SpO2 greater than or equal to 90%. -HOB elevated 30 degrees. -Plateau pressures less than 30 cm H20.  -Follow chest x-ray, ABG prn.   -Bronchial hygiene and RT/bronchodilator protocol.      Hyperglycemia, uncontrolled Hx of DM2, started on steroids 01/15/2021 P: -Continue sliding scale insulin with Lantus 22 units, consider increasing if remains above goal  Tachycardia Paroxsymal Afib: (on Eliquis at [place]) Elevated BNP Hx HFpEF Currently rhythm controlled P: -Continue amiodarone and Eliquis -Heart rate improved today   Anemia, chronic Stable P: -Continue to monitor and transfuse for hemoglobin less than 7    Hx of Mood disorder, Bipolar, Parkinsons -Continue PTA meds  history of hyperthyroidism  -Continue PTA PTU   Best practice (right click and "Reselect all SmartList Selections" daily)  Diet:  Tube Feed  Pain/Anxiety/Delirium protocol (if indicated): Yes (RASS goal +2) VAP protocol (if indicated): Yes DVT prophylaxis: SCD GI prophylaxis: PPI Glucose control:  SSI Yes and Basal insulin Yes Central venous access:   N/A Arterial line:  N/A Foley:  N/A Mobility:  bed rest  PT consulted: N/A Last date of multidisciplinary goals of care discussion Code Status:  full code Disposition: ICU  Labs   CBC: Recent Labs  Lab 01/14/21 0915 01/14/21 1003 01/16/21 0235 01/17/21 0205 01/17/21 1112 01/18/21 0132 01/19/21 0050 01/20/21 0211  WBC 12.2*   < > 8.4 9.4  --  9.4 9.2 10.1  NEUTROABS 10.7*  --   --   --   --   --   --   --   HGB 7.4*   < > 7.7* 9.0* 8.5* 8.7* 9.7* 9.3*  HCT 24.9*   < > 24.5* 28.7* 25.0* 27.9* 31.5* 30.7*  MCV 97.6   < > 93.9 95.0  --  95.5 95.7 95.9  PLT 384   < > 403* 448*  --  433* 449* 421*   < > = values in this interval not displayed.    Basic Metabolic Panel: Recent Labs  Lab 01/15/21 0427 01/16/21 0235 01/17/21 0205 01/17/21 1112 01/18/21 0132 01/19/21 0050  NA 138 138 144 141 141 144  K 4.5 4.2 3.3* 4.7 3.5 4.9  CL 92* 91* 95*  --  93* 96*  CO2 36* 37* 42*  --  41* 39*  GLUCOSE 151* 290* 132*  --  206* 204*  BUN 34* 48* 41*  --  37* 35*  CREATININE 1.21* 1.11* 0.78  --  0.73 0.79  CALCIUM 8.5* 8.6* 9.1  --  8.7* 8.9  MG 1.8  2.4 2.4  --  1.9 2.2  PHOS 5.5* 4.7* 3.2  --  2.8 3.1    CRITICAL CARE Performed by: Otilio Carpen TRUE [place]   Total critical care time: 33 minutes  Critical care time was exclusive of separately billable procedures and treating other patients.  Critical care was necessary to treat or prevent imminent or life-threatening deterioration.  Critical care was time spent personally by me on the following activities: development of treatment plan with patient and/or surrogate as well as nursing, discussions with consultants, evaluation of patient's response to treatment, examination of patient, obtaining history from patient or surrogate, ordering and performing treatments and interventions, ordering and review of laboratory studies, ordering and review of radiographic studies, pulse oximetry and re-evaluation of patient's  condition.      Otilio Carpen Damareon Lanni, PA-C [facility] See Amion for pager If no response to pager , please call [phone] until 7pm After 7:00 pm call Elink  [phone]

## 2021-01-20 NOTE — TOC Progression Note (Signed)
Transition of Care [facility]) - Progression Note    Patient Details  Name: VALORI HOLLENKAMP MRN: [id number] Date of Birth: [birth year]  Transition of Care [facility]) CM/SW Contact  Bartholomew Crews, RN Phone Number: [phone] 01/20/2021, 10:20 AM  Clinical Narrative:     Received call back from admissions at [facility]. Requesting to reviewing updated clinical notes - faxed [phone]. Bed is available if deemed clinically appropriate to return. TOC following.        Expected Discharge Plan and Services                                                 Social Determinants of Health (SDOH) Interventions    Readmission Risk Interventions No flowsheet data found.

## 2021-01-20 NOTE — TOC Transition Note (Signed)
Transition of Care [facility]) - CM/SW Discharge Note   Patient Details  Name: Lindsey Collins MRN: [id number] Date of Birth: 1946-01-13  Transition [facility] Care [facility]) CM/SW Contact:  Bartholomew Crews, RN Phone Number: [phone] 01/20/2021, 1:30 PM   Clinical Narrative:     Patient ready to transition back to [facility]. DC summary faxed. [facility] arranged. Transport paperwork done. No further TOC needs.   Final next level of care: Long Term Nursing Home Barriers to Discharge: No Barriers Identified   Patient Goals and CMS Choice Patient states their goals for this hospitalization and ongoing recovery are:: return to [email or web address].gov Compare Post Acute Care list provided to:: Patient Choice offered to / list presented to : Adult Children  Discharge Placement              Patient chooses bed at: [place] Patient to be transferred to facility by: [place] Name of family member notified: Greig Right Patient and family notified of of transfer: 01/20/21  Discharge Plan and Services                DME Arranged: N/A DME Agency: NA       HH Arranged: NA HH Agency: NA        Social Determinants of Health (SDOH) Interventions     Readmission Risk Interventions No flowsheet data found.

## 2021-02-09 ENCOUNTER — Other Ambulatory Visit: Payer: Self-pay

## 2021-02-09 ENCOUNTER — Emergency Department (HOSPITAL_COMMUNITY)
Admission: EM | Admit: 2021-02-09 | Discharge: 2021-02-10 | Disposition: A | Discharge status: Home / Self Care | Payer: Medicare Other | Attending: Emergency Medicine | Admitting: Emergency Medicine

## 2021-02-09 ENCOUNTER — Encounter (HOSPITAL_COMMUNITY): Payer: Self-pay | Admitting: *Deleted

## 2021-02-09 ENCOUNTER — Emergency Department (HOSPITAL_COMMUNITY): Payer: Medicare Other

## 2021-02-09 DIAGNOSIS — N182 Chronic kidney disease, stage 2 (mild): Secondary | ICD-10-CM | POA: Insufficient documentation

## 2021-02-09 DIAGNOSIS — I129 Hypertensive chronic kidney disease with stage 1 through stage 4 chronic kidney disease, or unspecified chronic kidney disease: Secondary | ICD-10-CM | POA: Diagnosis present

## 2021-02-09 DIAGNOSIS — J449 Chronic obstructive pulmonary disease, unspecified: Secondary | ICD-10-CM | POA: Insufficient documentation

## 2021-02-09 DIAGNOSIS — G20 Parkinson's disease: Secondary | ICD-10-CM | POA: Diagnosis not present

## 2021-02-09 DIAGNOSIS — Z794 Long term (current) use of insulin: Secondary | ICD-10-CM | POA: Diagnosis not present

## 2021-02-09 DIAGNOSIS — Z20822 Contact with and (suspected) exposure to covid-19: Secondary | ICD-10-CM | POA: Insufficient documentation

## 2021-02-09 DIAGNOSIS — R404 Transient alteration of awareness: Secondary | ICD-10-CM

## 2021-02-09 LAB — CBC WITH DIFFERENTIAL/PLATELET
Abs Immature Granulocytes: 0.38 10*3/uL — ABNORMAL HIGH (ref 0.00–0.07)
Basophils Absolute: 0.1 10*3/uL (ref 0.0–0.1)
Basophils Relative: 1 %
Eosinophils Absolute: 0 10*3/uL (ref 0.0–0.5)
Eosinophils Relative: 0 %
HCT: 35.7 % — ABNORMAL LOW (ref 36.0–46.0)
Hemoglobin: 10.7 g/dL — ABNORMAL LOW (ref 12.0–15.0)
Immature Granulocytes: 3 %
Lymphocytes Relative: 9 %
Lymphs Abs: 1.3 10*3/uL (ref 0.7–4.0)
MCH: 28.6 pg (ref 26.0–34.0)
MCHC: 30 g/dL (ref 30.0–36.0)
MCV: 95.5 fL (ref 80.0–100.0)
Monocytes Absolute: 0.9 10*3/uL (ref 0.1–1.0)
Monocytes Relative: 6 %
Neutro Abs: 11.9 10*3/uL — ABNORMAL HIGH (ref 1.7–7.7)
Neutrophils Relative %: 81 %
Platelets: 194 10*3/uL (ref 150–400)
RBC: 3.74 MIL/uL — ABNORMAL LOW (ref 3.87–5.11)
RDW: 15.4 % (ref 11.5–15.5)
WBC: 14.7 10*3/uL — ABNORMAL HIGH (ref 4.0–10.5)
nRBC: 0.1 % (ref 0.0–0.2)

## 2021-02-09 LAB — BASIC METABOLIC PANEL
Anion gap: 8 (ref 5–15)
BUN: 19 mg/dL (ref 8–23)
CO2: 32 mmol/L (ref 22–32)
Calcium: 9.2 mg/dL (ref 8.9–10.3)
Chloride: 97 mmol/L — ABNORMAL LOW (ref 98–111)
Creatinine, Ser: 0.84 mg/dL (ref 0.44–1.00)
GFR, Estimated: >60 mL/min (ref >60)
Glucose, Bld: 193 mg/dL — ABNORMAL HIGH (ref 70–99)
Potassium: 5.2 mmol/L — ABNORMAL HIGH (ref 3.5–5.1)
Sodium: 137 mmol/L (ref 135–145)

## 2021-02-09 LAB — RESP PANEL BY RT-PCR (FLU A&B, COVID) ARPGX2
Influenza A by PCR: NEGATIVE
Influenza B by PCR: NEGATIVE
SARS Coronavirus 2 by RT PCR: NEGATIVE

## 2021-02-09 LAB — MAGNESIUM: Magnesium: 2 mg/dL (ref 1.7–2.4)

## 2021-02-09 LAB — CBG MONITORING, ED: Glucose-Capillary: 212 mg/dL — ABNORMAL HIGH (ref 70–99)

## 2021-02-09 LAB — TROPONIN I (HIGH SENSITIVITY): Troponin I (High Sensitivity): 11 ng/L (ref <18)

## 2021-02-09 NOTE — Discharge Instructions (Signed)
Follow-up with your normal doctor as needed.  Continue taking your normal medications. Come back to the emergency department for chest pain, shortness of breath, or any other concerning medical emergencies.

## 2021-02-09 NOTE — ED Triage Notes (Signed)
The pt arrived from  [facility] chronic vent.  Sent here today due to hypertension  .  She arrived by [facility]  They have transported this pt numerus times from [facility] for different complaints.  Today they sent her for hypertension.  However  The pts bp with [facility]  And on arrival is normal   The pt is alert and resp therapy has connected her to a vent

## 2021-02-09 NOTE — ED Notes (Signed)
Unable to get the refusal mse signed and I am unable to document this the computer will not allow me todocument on this

## 2021-02-09 NOTE — ED Notes (Signed)
Report attempted rn to attempt again [facility] facility stated they would call rn back

## 2021-02-09 NOTE — ED Provider Notes (Signed)
[facility] EMERGENCY DEPARTMENT Provider Note   CSN: [id number] Arrival date & time: 02/09/21  1528     History Chief Complaint  Patient presents with  . Hypertension    CLAYTON JARMON is a 75 y.o. female.  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  Upon arrival, patient is not talking, but is able to answer yes and no questions due to her tracheostomy.  She denies pain anywhere, difficulty breathing, or any symptoms at this time.  She has no concerns.  Per EMS, they were called out for high blood pressure.  Upon their arrival, patient was looking well with a normal blood pressure.  Patient also states she is not worried about her blood pressure.    I spoke to patient's daughter on the phone who states that she told patient that her brother died yesterday.  Today, great niece was visiting and patient turned red and was less responsive than normal.  Staff at the facility she lives at found her blood pressure to be high and had mildly decreased oxygen.  She is uncertain how long this episode lasted.  Nothing made it better, nothing made it worse.  Has not had anything like this previously.  Per EMS, by the time they arrived she was at her baseline.    Past Medical History:  Diagnosis Date  . Acute on chronic respiratory failure with hypoxia ([place])   . ARDS (adult respiratory distress syndrome) ([place])   . Chronic kidney disease, stage II (mild)   . MRSA (methicillin resistant staphylococcus aureus) pneumonia ([place])   . Obstructive chronic bronchitis without exacerbation [facility])     Patient Active Problem List   Diagnosis Date Noted  . ARDS (adult respiratory distress syndrome) ([place]) 01/14/2021  . Community acquired pneumonia of right lung   . Chronic respiratory failure with hypoxia ([place]) 12/20/2020  . Status post tracheostomy ([place]) 12/20/2020   . Normocytic anemia 12/20/2020  . Parkinson's disease ([place]) 12/20/2020  . Diabetes mellitus type 2, uncontrolled ([place]) 12/20/2020  . Acute on chronic respiratory failure with hypoxia and hypercapnia (HCC)   . MRSA (methicillin resistant staphylococcus aureus) pneumonia ([place])   . MRSA (methicillin resistant staphylococcus aureus) pneumonia ([place])   . Obstructive chronic bronchitis without exacerbation ([place])   . Chronic kidney disease, stage II (mild)     Past Surgical History:  Procedure Laterality Date  . TRACHEOSTOMY       OB History   No obstetric history on file.     Family History  Problem Relation Age of Onset  . CAD Mother        hx CABG    Social History   Tobacco Use  . Smoking status: Never Smoker  . Smokeless tobacco: Never Used    Home Medications Prior to Admission medications   Medication Sig Start Date End Date Taking? Authorizing Provider  allopurinol (ZYLOPRIM) 100 MG tablet Take 200 mg by mouth daily.    [provider]  amiodarone (PACERONE) 200 MG tablet Take 200 mg by mouth daily. 10/25/20   [provider]  atorvastatin (LIPITOR) 20 MG tablet Take 20 mg by mouth at bedtime. 11/26/20   [provider]  carbidopa-levodopa (SINEMET IR) 25-100 MG tablet Take 1 tablet by mouth every 6 (six) hours. Times given : 00:00, 0600; 12:00, 1800 10/28/20   [provider]  chlorhexidine (PERIDEX) 0.12 % solution Use as  directed 15 mLs in the mouth or throat 2 (two) times daily.    [provider]  cholecalciferol (VITAMIN D3) 25 MCG (1000 UNIT) tablet Take 1,000 Units by mouth daily.    [provider]  divalproex (DEPAKOTE SPRINKLE) 125 MG capsule Take 500 mg by mouth 2 (two) times daily. 11/24/20   [provider]  ELIQUIS 5 MG TABS tablet Take 5 mg by mouth 2 (two) times daily. 12/02/20   [provider]  FLUoxetine (PROZAC) 10 MG tablet Take 10 mg by mouth daily. 11/08/20   [provider]  furosemide (LASIX) 20 MG tablet Take 20 mg by mouth every 12 (twelve) hours. 12/02/20   [provider]  guaiFENesin 200 MG tablet Take 400 mg by mouth every 8 (eight) hours as needed for cough or to loosen phlegm.    [provider]  insulin aspart (NOVOLOG) 100 UNIT/ML injection Inject 0-10 Units into the skin every 6 (six) hours. Sliding scale: CBG 150 = 0 units, 151-200 = 2 units, 201-250 = 4 units, 251-300 = 6 units, 301-350 = 8 units, 351-400 = 10 units.    [provider]  insulin glargine (LANTUS) 100 UNIT/ML injection Inject 44 Units into the skin at bedtime.    [provider]  ipratropium-albuterol (DUONEB) 0.5-2.5 (3) MG/3ML SOLN Take 3 mLs by nebulization every 4 (four) hours as needed (shortness of breath).    [provider]  magnesium oxide (MAG-OX) 400 MG tablet Take 400 mg by mouth 2 (two) times daily.    [provider]  melatonin 3 MG TABS tablet Take 6 mg by mouth at bedtime.    [provider]  memantine (NAMENDA) 5 MG tablet Take 5 mg by mouth 2 (two) times daily. 11/26/20   [provider]  metoprolol tartrate (LOPRESSOR) 25 MG tablet Take 25 mg by mouth 2 (two) times daily.    [provider]  Nutritional Supplements (FEEDING SUPPLEMENT, GLUCERNA 1.2 CAL,) LIQD Place 1,000 mLs into feeding tube continuous. 60 ml /hr 100q4    [provider]  OLANZapine (ZYPREXA) 10 MG tablet Take 10 mg by mouth at bedtime. 11/16/20   [provider]  pantoprazole sodium (PROTONIX) 40 mg/20 mL PACK Take 40 mg by mouth daily.    [provider]  polyethylene glycol (MIRALAX / GLYCOLAX) 17 g packet Take 17 g by mouth daily.    [provider]  Potassium Chloride 40 MEQ/15ML (20%) SOLN Take 40 mEq by mouth daily.    [provider]  pramipexole (MIRAPEX) 0.125 MG tablet Take 0.125 mg by mouth every 8 (eight) hours. 11/05/20   [provider]  propylthiouracil (PTU)  50 MG tablet Take 50 mg by mouth every 12 (twelve) hours. 10/19/20   [provider]  senna-docusate (SENOKOT-S) 8.6-50 MG tablet Take 2 tablets by mouth at bedtime.    [provider]    Allergies    Patient has no known allergies.  Review of Systems   Review of Systems  Constitutional: Negative for chills and fever.  HENT: Negative for ear pain and sore throat.   Eyes: Negative for pain and visual disturbance.  Respiratory: Negative for cough and shortness of breath.   Cardiovascular: Negative for chest pain and palpitations.  Gastrointestinal: Negative for abdominal pain and vomiting.  Genitourinary: Negative for dysuria and hematuria.  Musculoskeletal: Negative for arthralgias and back pain.  Skin: Negative for color change and rash.  Neurological: Negative for headaches.  Psychiatric/Behavioral: Negative for sleep disturbance.  All other systems reviewed and are negative.   Physical Exam Updated Vital Signs BP (!) 101/45   Pulse 73   Temp 98.3 F (36.8 C) (Oral)   Resp (!) 24   Ht 5\' 4"  (1.626 m)   Wt 95 kg   SpO2 98%   BMI 35.95 kg/m   Physical Exam Vitals and nursing note reviewed.  Constitutional:      General: She is not in acute distress.    Appearance: She is well-developed.  HENT:     Head: Normocephalic and atraumatic.     Nose: Nose normal.     Mouth/Throat:     Mouth: Mucous membranes are dry.  Eyes:     Extraocular Movements: Extraocular movements intact.     Conjunctiva/sclera: Conjunctivae normal.  Cardiovascular:     Rate and Rhythm: Normal rate and regular rhythm.     Heart sounds: Normal heart sounds. No murmur heard.   Pulmonary:     Comments: Ventilator with tracheostomy Abdominal:     General: Abdomen is flat.     Palpations: Abdomen is soft.     Tenderness: There is no abdominal tenderness. There is no guarding or rebound.     Comments: Feeding tube to left upper quadrant with scant surrounding erythema without  induration.  Musculoskeletal:     Cervical back: Neck supple. No rigidity.     Right lower leg: Edema (1+) present.     Left lower leg: Edema (1+) present.  Skin:    General: Skin is warm and dry.     Capillary Refill: Capillary refill takes less than 2 seconds.  Neurological:     Mental Status: She is alert. Mental status is at baseline.  Psychiatric:        Mood and Affect: Mood normal.     ED Results / Procedures / Treatments   Labs (all labs ordered are listed, but only abnormal results are displayed) Labs Reviewed  CBC WITH DIFFERENTIAL/PLATELET - Abnormal; Notable for the following components:      Result Value   WBC 14.7 (*)    RBC 3.74 (*)    Hemoglobin 10.7 (*)    HCT 35.7 (*)    Neutro Abs 11.9 (*)    Abs Immature Granulocytes 0.38 (*)    All other components within normal limits  BASIC METABOLIC PANEL - Abnormal; Notable for the following components:   Potassium 5.2 (*)    Chloride 97 (*)    Glucose, Bld 193 (*)    All other components within normal limits  CBG MONITORING, ED - Abnormal; Notable for the following components:   Glucose-Capillary 212 (*)    All other components within normal limits  RESP PANEL BY RT-PCR (FLU A&B, COVID) ARPGX2  MAGNESIUM  CBC WITH DIFFERENTIAL/PLATELET  TROPONIN I (HIGH SENSITIVITY)    EKG EKG Interpretation  Date/Time:  Sunday Feb 09 2021 17:06:08 EDT Ventricular Rate:  77 PR Interval:  181 QRS Duration: 84 QT Interval:  412 QTC Calculation: 467 R Axis:   70 Text Interpretation: Sinus rhythm Normal ECG Atrial flutter RESOLVED SINCE PREVIOUS Confirmed by Blanchie Dessert [phone]) on 02/09/2021 8:26:35 PM   Radiology DG Chest Portable 1 View  Result Date: 02/09/2021 CLINICAL DATA:  75 year old female with hypertension EXAM: PORTABLE CHEST 1 VIEW COMPARISON:  Chest radiograph dated 01/18/2021. FINDINGS: Tracheostomy in similar position. Diffuse interstitial reticulonodular densities may represent atypical infection.  Improved aeration of the right lung with residual opacity in  the right upper lobe. No pleural effusion or pneumothorax. Stable cardiac silhouette. No acute osseous pathology. IMPRESSION: Improved aeration of the right lung with residual opacity in the right upper lobe. Electronically Signed   By: Anner Crete M.D.   On: 02/09/2021 16:31    Procedures Procedures   Medications Ordered in ED Medications - No data to display  ED Course  I have reviewed the triage vital signs and the nursing notes.  Pertinent labs & imaging results that were available during my care of the patient were reviewed by me and considered in my medical decision making (see chart for details).    MDM Rules/Calculators/A&P                         75 year old female presents emergency department for blood pressure issue and possible oxygenation issue.  Upon arrival, vital signs are stable.  She is on her normal vent settings satting 100%.  No acute distress.  On exam, she has no abdominal tenderness.  Heart is regular rate and rhythm.  Lungs are clear to auscultation with mechanical breath sounds.  She has mild pitting edema to bilateral lower extremities without overlying signs of cellulitis.  Uncertain etiology of event described at nursing facility.  Could have had mucous plug that is since been suctioned out.  She is currently asymptomatic.  Will obtain basic screening labs and chest x-ray to further identify possible causes of transient mental status change.  I discussed this with daughter, she is in agreement with plan.    EKG normal rate and rhythm, no acute ischemic changes.  Chest x-ray with no acute changes, looks better than previous.  She is mildly hyperglycemic.  White blood cell count of 14.7, hemoglobin 10.7.  BMP with potassium of 5.2, suspect there is hemolysis as she has no other signs of hyperkalemia or reasons to be hyperkalemic.  Kidney function looks stable.  Upon reevaluation, patient still  is feeling well.  States she would like to go back to her facility.  Has no acute concerns at this time.  We discussed symptomatic management and return precautions.  Recommended close PCP follow-up.  Patient was discharged in stable condition.  Final Clinical Impression(s) / ED Diagnoses Final diagnoses:  Transient alteration of awareness    Rx / DC Orders ED Discharge Orders    None       Suzan Nailer, DO 02/09/21 2239    Blanchie Dessert, MD 02/10/21 [phone]

## 2021-02-09 NOTE — ED Notes (Signed)
Called [facility] for patient to transport to [facility]

## 2021-02-10 NOTE — ED Notes (Signed)
Med necessity, face sheet, carelink, transfer paper given to carelink staff

## 2021-03-08 ENCOUNTER — Inpatient Hospital Stay (HOSPITAL_COMMUNITY)
Admission: EM | Admit: 2021-03-08 | Discharge: 2021-03-10 | DRG: 389 | Disposition: A | Discharge status: Other Acute Inpatient Hospital | Payer: Medicare Other | Source: Skilled Nursing Facility | Attending: Internal Medicine | Admitting: Internal Medicine

## 2021-03-08 ENCOUNTER — Encounter (HOSPITAL_COMMUNITY): Payer: Self-pay | Admitting: Emergency Medicine

## 2021-03-08 ENCOUNTER — Other Ambulatory Visit: Payer: Self-pay

## 2021-03-08 ENCOUNTER — Emergency Department (HOSPITAL_COMMUNITY): Payer: Medicare Other

## 2021-03-08 DIAGNOSIS — K56609 Unspecified intestinal obstruction, unspecified as to partial versus complete obstruction: Secondary | ICD-10-CM | POA: Diagnosis present

## 2021-03-08 DIAGNOSIS — G20 Parkinson's disease: Secondary | ICD-10-CM | POA: Diagnosis present

## 2021-03-08 DIAGNOSIS — Z20822 Contact with and (suspected) exposure to covid-19: Secondary | ICD-10-CM | POA: Diagnosis not present

## 2021-03-08 DIAGNOSIS — N182 Chronic kidney disease, stage 2 (mild): Secondary | ICD-10-CM | POA: Diagnosis present

## 2021-03-08 DIAGNOSIS — E785 Hyperlipidemia, unspecified: Secondary | ICD-10-CM | POA: Diagnosis present

## 2021-03-08 DIAGNOSIS — R1110 Vomiting, unspecified: Secondary | ICD-10-CM

## 2021-03-08 DIAGNOSIS — Z8701 Personal history of pneumonia (recurrent): Secondary | ICD-10-CM

## 2021-03-08 DIAGNOSIS — J9611 Chronic respiratory failure with hypoxia: Secondary | ICD-10-CM | POA: Diagnosis present

## 2021-03-08 DIAGNOSIS — K432 Incisional hernia without obstruction or gangrene: Secondary | ICD-10-CM | POA: Diagnosis not present

## 2021-03-08 DIAGNOSIS — K567 Ileus, unspecified: Secondary | ICD-10-CM | POA: Diagnosis not present

## 2021-03-08 DIAGNOSIS — E1122 Type 2 diabetes mellitus with diabetic chronic kidney disease: Secondary | ICD-10-CM | POA: Diagnosis not present

## 2021-03-08 DIAGNOSIS — Z930 Tracheostomy status: Secondary | ICD-10-CM | POA: Diagnosis not present

## 2021-03-08 DIAGNOSIS — Z8249 Family history of ischemic heart disease and other diseases of the circulatory system: Secondary | ICD-10-CM

## 2021-03-08 DIAGNOSIS — Z79890 Hormone replacement therapy (postmenopausal): Secondary | ICD-10-CM | POA: Diagnosis not present

## 2021-03-08 DIAGNOSIS — M109 Gout, unspecified: Secondary | ICD-10-CM | POA: Diagnosis not present

## 2021-03-08 DIAGNOSIS — E1165 Type 2 diabetes mellitus with hyperglycemia: Secondary | ICD-10-CM | POA: Diagnosis not present

## 2021-03-08 DIAGNOSIS — Z9911 Dependence on respirator [ventilator] status: Secondary | ICD-10-CM

## 2021-03-08 DIAGNOSIS — Z79899 Other long term (current) drug therapy: Secondary | ICD-10-CM

## 2021-03-08 DIAGNOSIS — Z7901 Long term (current) use of anticoagulants: Secondary | ICD-10-CM

## 2021-03-08 DIAGNOSIS — Z794 Long term (current) use of insulin: Secondary | ICD-10-CM

## 2021-03-08 DIAGNOSIS — Z931 Gastrostomy status: Secondary | ICD-10-CM

## 2021-03-08 DIAGNOSIS — G2581 Restless legs syndrome: Secondary | ICD-10-CM | POA: Diagnosis present

## 2021-03-08 DIAGNOSIS — E0590 Thyrotoxicosis, unspecified without thyrotoxic crisis or storm: Secondary | ICD-10-CM | POA: Diagnosis not present

## 2021-03-08 DIAGNOSIS — J449 Chronic obstructive pulmonary disease, unspecified: Secondary | ICD-10-CM | POA: Diagnosis not present

## 2021-03-08 DIAGNOSIS — I129 Hypertensive chronic kidney disease with stage 1 through stage 4 chronic kidney disease, or unspecified chronic kidney disease: Secondary | ICD-10-CM | POA: Diagnosis present

## 2021-03-08 LAB — CBC WITH DIFFERENTIAL/PLATELET
Abs Immature Granulocytes: 0.05 10*3/uL (ref 0.00–0.07)
Basophils Absolute: 0 10*3/uL (ref 0.0–0.1)
Basophils Relative: 0 %
Eosinophils Absolute: 0.1 10*3/uL (ref 0.0–0.5)
Eosinophils Relative: 1 %
HCT: 36.3 % (ref 36.0–46.0)
Hemoglobin: 10.7 g/dL — ABNORMAL LOW (ref 12.0–15.0)
Immature Granulocytes: 1 %
Lymphocytes Relative: 10 %
Lymphs Abs: 0.9 10*3/uL (ref 0.7–4.0)
MCH: 27.4 pg (ref 26.0–34.0)
MCHC: 29.5 g/dL — ABNORMAL LOW (ref 30.0–36.0)
MCV: 93.1 fL (ref 80.0–100.0)
Monocytes Absolute: 0.5 10*3/uL (ref 0.1–1.0)
Monocytes Relative: 6 %
Neutro Abs: 7.7 10*3/uL (ref 1.7–7.7)
Neutrophils Relative %: 82 %
Platelets: 228 10*3/uL (ref 150–400)
RBC: 3.9 MIL/uL (ref 3.87–5.11)
RDW: 16.2 % — ABNORMAL HIGH (ref 11.5–15.5)
WBC: 9.2 10*3/uL (ref 4.0–10.5)
nRBC: 0 % (ref 0.0–0.2)

## 2021-03-08 LAB — COMPREHENSIVE METABOLIC PANEL
ALT: 11 U/L (ref 0–44)
AST: 14 U/L — ABNORMAL LOW (ref 15–41)
Albumin: 3.2 g/dL — ABNORMAL LOW (ref 3.5–5.0)
Alkaline Phosphatase: 56 U/L (ref 38–126)
Anion gap: 9 (ref 5–15)
BUN: 20 mg/dL (ref 8–23)
CO2: 29 mmol/L (ref 22–32)
Calcium: 8.9 mg/dL (ref 8.9–10.3)
Chloride: 100 mmol/L (ref 98–111)
Creatinine, Ser: 0.81 mg/dL (ref 0.44–1.00)
GFR, Estimated: >60 mL/min (ref >60)
Glucose, Bld: 156 mg/dL — ABNORMAL HIGH (ref 70–99)
Potassium: 5 mmol/L (ref 3.5–5.1)
Sodium: 138 mmol/L (ref 135–145)
Total Bilirubin: 0.5 mg/dL (ref 0.3–1.2)
Total Protein: 7.3 g/dL (ref 6.5–8.1)

## 2021-03-08 LAB — I-STAT ARTERIAL BLOOD GAS, ED
Acid-Base Excess: 12 mmol/L — ABNORMAL HIGH (ref 0.0–2.0)
Bicarbonate: 36.2 mmol/L — ABNORMAL HIGH (ref 20.0–28.0)
Calcium, Ion: 1.19 mmol/L (ref 1.15–1.40)
HCT: 31 % — ABNORMAL LOW (ref 36.0–46.0)
Hemoglobin: 10.5 g/dL — ABNORMAL LOW (ref 12.0–15.0)
O2 Saturation: 100 %
Patient temperature: 100
Potassium: 4.3 mmol/L (ref 3.5–5.1)
Sodium: 134 mmol/L — ABNORMAL LOW (ref 135–145)
TCO2: 38 mmol/L — ABNORMAL HIGH (ref 22–32)
pCO2 arterial: 45.1 mmHg (ref 32.0–48.0)
pH, Arterial: 7.516 — ABNORMAL HIGH (ref 7.350–7.450)
pO2, Arterial: 291 mmHg — ABNORMAL HIGH (ref 83.0–108.0)

## 2021-03-08 LAB — LIPASE, BLOOD: Lipase: 19 U/L (ref 11–51)

## 2021-03-08 MED ORDER — DILTIAZEM HCL-DEXTROSE 125-5 MG/125ML-% IV SOLN (PREMIX): 5.0000 mg/h | INTRAVENOUS | Status: DC

## 2021-03-08 MED ORDER — DILTIAZEM LOAD VIA INFUSION: 10.0000 mg | Freq: Once | INTRAVENOUS | Status: DC

## 2021-03-08 NOTE — ED Provider Notes (Signed)
[facility] EMERGENCY DEPARTMENT Provider Note   CSN: [id number] Arrival date & time: 03/08/21  1915     History Chief Complaint  Patient presents with   Emesis    Lindsey Collins is a 75 y.o. female.  Patient is a 75 year old female who presents from [place].  She is vent dependent.  She has a history of chronic kidney disease, Parkinson's and diabetes.  She reports a 2-day history of increasing abdominal bloating with some associated abdominal pain.  Today she had some projectile vomiting.  She does have a PEG in place and there was some leakage around the PEG with this vomiting.  She denies any known fevers.  No shortness of breath.  She has had some loose stools for the last 2 days.  No urinary symptoms.  She denies any known history of prior bowel obstructions.      Past Medical History:  Diagnosis Date   Acute on chronic respiratory failure with hypoxia (HCC)    ARDS (adult respiratory distress syndrome) (HCC)    Chronic kidney disease, stage II (mild)    MRSA (methicillin resistant staphylococcus aureus) pneumonia (HCC)    Obstructive chronic bronchitis without exacerbation ([place])     Patient Active Problem List   Diagnosis Date Noted   ARDS (adult respiratory distress syndrome) ([place]) 01/14/2021   Community acquired pneumonia of right lung    Chronic respiratory failure with hypoxia ([place]) 12/20/2020   Status post tracheostomy ([place]) 12/20/2020   Normocytic anemia 12/20/2020   Parkinson's disease ([place]) 12/20/2020   Diabetes mellitus type 2, uncontrolled ([place]) 12/20/2020   Acute on chronic respiratory failure with hypoxia and hypercapnia (HCC)    MRSA (methicillin resistant staphylococcus aureus) pneumonia (HCC)    MRSA (methicillin resistant staphylococcus aureus) pneumonia (HCC)    Obstructive chronic bronchitis without exacerbation (HCC)    Chronic kidney disease, stage II (mild)     Past Surgical History:  Procedure Laterality Date    TRACHEOSTOMY       OB History   No obstetric history on file.     Family History  Problem Relation Age of Onset   CAD Mother        hx CABG    Social History   Tobacco Use   Smoking status: Never   Smokeless tobacco: Never    Home Medications Prior to Admission medications   Medication Sig Start Date End Date Taking? Authorizing Provider  allopurinol (ZYLOPRIM) 100 MG tablet Take 200 mg by mouth daily.    [provider]  amiodarone (PACERONE) 200 MG tablet Take 200 mg by mouth daily. 10/25/20   [provider]  atorvastatin (LIPITOR) 20 MG tablet Take 20 mg by mouth at bedtime. 11/26/20   [provider]  carbidopa-levodopa (SINEMET IR) 25-100 MG tablet Take 1 tablet by mouth every 6 (six) hours. Times given : 00:00, 0600; 12:00, 1800 10/28/20   [provider]  chlorhexidine (PERIDEX) 0.12 % solution Use as directed 15 mLs in the mouth or throat 2 (two) times daily.    [provider]  cholecalciferol (VITAMIN D3) 25 MCG (1000 UNIT) tablet Take 1,000 Units by mouth daily.    [provider]  divalproex (DEPAKOTE SPRINKLE) 125 MG capsule Take 500 mg by mouth 2 (two) times daily. 11/24/20   [provider]  ELIQUIS 5 MG TABS tablet Take 5 mg by mouth 2 (two) times daily. 12/02/20   [provider]  FLUoxetine (PROZAC) 10 MG tablet Take 10  mg by mouth daily. 11/08/20   [provider]  furosemide (LASIX) 20 MG tablet Take 20 mg by mouth every 12 (twelve) hours. 12/02/20   [provider]  guaiFENesin 200 MG tablet Take 400 mg by mouth every 8 (eight) hours as needed for cough or to loosen phlegm.    [provider]  insulin aspart (NOVOLOG) 100 UNIT/ML injection Inject 0-10 Units into the skin every 6 (six) hours. Sliding scale: CBG 150 = 0 units, 151-200 = 2 units, 201-250 = 4 units, 251-300 = 6 units, 301-350 = 8 units, 351-400 = 10 units.    [provider]  insulin glargine  (LANTUS) 100 UNIT/ML injection Inject 44 Units into the skin at bedtime.    [provider]  ipratropium-albuterol (DUONEB) 0.5-2.5 (3) MG/3ML SOLN Take 3 mLs by nebulization every 4 (four) hours as needed (shortness of breath).    [provider]  magnesium oxide (MAG-OX) 400 MG tablet Take 400 mg by mouth 2 (two) times daily.    [provider]  melatonin 3 MG TABS tablet Take 6 mg by mouth at bedtime.    [provider]  memantine (NAMENDA) 5 MG tablet Take 5 mg by mouth 2 (two) times daily. 11/26/20   [provider]  metoprolol tartrate (LOPRESSOR) 25 MG tablet Take 25 mg by mouth 2 (two) times daily.    [provider]  Nutritional Supplements (FEEDING SUPPLEMENT, GLUCERNA 1.2 CAL,) LIQD Place 1,000 mLs into feeding tube continuous. 60 ml /hr 100q4    [provider]  OLANZapine (ZYPREXA) 10 MG tablet Take 10 mg by mouth at bedtime. 11/16/20   [provider]  pantoprazole sodium (PROTONIX) 40 mg/20 mL PACK Take 40 mg by mouth daily.    [provider]  polyethylene glycol (MIRALAX / GLYCOLAX) 17 g packet Take 17 g by mouth daily.    [provider]  Potassium Chloride 40 MEQ/15ML (20%) SOLN Take 40 mEq by mouth daily.    [provider]  pramipexole (MIRAPEX) 0.125 MG tablet Take 0.125 mg by mouth every 8 (eight) hours. 11/05/20   [provider]  propylthiouracil (PTU) 50 MG tablet Take 50 mg by mouth every 12 (twelve) hours. 10/19/20   [provider]  senna-docusate (SENOKOT-S) 8.6-50 MG tablet Take 2 tablets by mouth at bedtime.    [provider]    Allergies    Patient has no known allergies.  Review of Systems   Review of Systems  Constitutional:  Negative for chills, diaphoresis, fatigue and fever.  HENT:  Negative for congestion, rhinorrhea and sneezing.   Eyes: Negative.   Respiratory:  Negative for cough, chest tightness and shortness of breath.    Cardiovascular:  Negative for chest pain and leg swelling.  Gastrointestinal:  Positive for abdominal distention, abdominal pain, diarrhea, nausea and vomiting. Negative for blood in stool.  Genitourinary:  Negative for difficulty urinating, flank pain, frequency and hematuria.  Musculoskeletal:  Negative for arthralgias and back pain.  Skin:  Negative for rash.  Neurological:  Negative for dizziness, speech difficulty, weakness, numbness and headaches.   Physical Exam Updated Vital Signs BP 133/68   Pulse 84   Temp 100.2 F (37.9 C) (Rectal)   Resp 18   Ht 5\' 4"  (1.626 m)   Wt 95 kg   SpO2 96%   BMI 35.95 kg/m   Physical Exam Constitutional:      Appearance: She is well-developed.  HENT:  Head: Normocephalic and atraumatic.  Eyes:     Pupils: Pupils are equal, round, and reactive to light.  Cardiovascular:     Rate and Rhythm: Normal rate and regular rhythm.     Heart sounds: Normal heart sounds.  Pulmonary:     Effort: Pulmonary effort is normal. No respiratory distress.     Breath sounds: Normal breath sounds. No wheezing or rales.  Chest:     Chest wall: No tenderness.  Abdominal:     General: Bowel sounds are normal. There is distension.     Palpations: Abdomen is soft.     Tenderness: There is abdominal tenderness (Mild generalized tenderness). There is no guarding or rebound.     Comments: Tympanic to percussion, and PEG tube is in place, there is no active leaking around the tube.  No suggestions of infection.  Musculoskeletal:        General: Normal range of motion.     Cervical back: Normal range of motion and neck supple.  Lymphadenopathy:     Cervical: No cervical adenopathy.  Skin:    General: Skin is warm and dry.     Findings: No rash.  Neurological:     Mental Status: She is alert and oriented to person, place, and time.    ED Results / Procedures / Treatments   Labs (all labs ordered are listed, but only abnormal results are displayed) Labs  Reviewed  COMPREHENSIVE METABOLIC PANEL - Abnormal; Notable for the following components:      Result Value   Glucose, Bld 156 (*)    Albumin 3.2 (*)    AST 14 (*)    All other components within normal limits  CBC WITH DIFFERENTIAL/PLATELET - Abnormal; Notable for the following components:   Hemoglobin 10.7 (*)    MCHC 29.5 (*)    RDW 16.2 (*)    All other components within normal limits  I-STAT ARTERIAL BLOOD GAS, ED - Abnormal; Notable for the following components:   pH, Arterial 7.516 (*)    pO2, Arterial 291 (*)    Bicarbonate 36.2 (*)    TCO2 38 (*)    Acid-Base Excess 12.0 (*)    Sodium 134 (*)    HCT 31.0 (*)    Hemoglobin 10.5 (*)    All other components within normal limits  CULTURE, BLOOD (SINGLE)  LIPASE, BLOOD  URINALYSIS, ROUTINE W REFLEX MICROSCOPIC  LACTIC ACID, PLASMA  LACTIC ACID, PLASMA    EKG EKG Interpretation  Date/Time:  Saturday March 08 2021 19:29:31 EDT Ventricular Rate:  88 PR Interval:  172 QRS Duration: 84 QT Interval:  403 QTC Calculation: 488 R Axis:   68 Text Interpretation: Sinus rhythm Low voltage, precordial leads Borderline prolonged QT interval since last tracing no significant change Confirmed by Malvin Johns [phone]) on 03/08/2021 7:48:51 PM  Radiology DG Chest Port 1 View  Result Date: 03/08/2021 CLINICAL DATA:  Vomiting and lower abdominal pain. EXAM: PORTABLE CHEST 1 VIEW COMPARISON:  Feb 09, 2021 FINDINGS: There is stable tracheostomy tube positioning. Low lung volumes are noted which may be secondary to the degree of patient inspiration. Mild atelectasis and/or infiltrate is seen within the retrocardiac region of the left lung base. The cardiac silhouette is borderline in size. There is mild calcification of the aortic arch. The visualized skeletal structures are unremarkable. IMPRESSION: Low lung volumes with mild left basilar atelectasis and/or infiltrate. Electronically Signed   By: Virgina Norfolk M.D.   On: 03/08/2021 22:32  Procedures Procedures   Medications Ordered in ED Medications - No data to display  ED Course  I have reviewed the triage vital signs and the nursing notes.  Pertinent labs & imaging results that were available during my care of the patient were reviewed by me and considered in my medical decision making (see chart for details).    MDM Rules/Calculators/A&P                          Patient is a 75 year old female who presents with abdominal distention and vomiting.  She has PEG in place.  She has had a CT scan and we are awaiting results.  She had a rectal temp of 100.2.  Her white count is normal.  I had ordered a lactate and blood cultures but she is refusing further blood draw.  She initially said she wanted to go back to [facility] and was refusing the CT scan but I did encourage her to stay for that.  Assuming the CT scan is normal, she can likely be discharged back to [facility].  She has not had any further episodes of vomiting.  Dr. Christy Gentles to follow-up on the CT scan and reassess patient. Final Clinical Impression(s) / ED Diagnoses Final diagnoses:  Vomiting    Rx / DC Orders ED Discharge Orders     None        Malvin Johns, MD 03/08/21 2337

## 2021-03-08 NOTE — ED Notes (Signed)
This EMT entered room to obtain labs. Patient began shaking her head no. EMT informed patient of importance of obtaining labs, patient continued shaking her head no, pointing at gauze from prior sticks. RN informed.

## 2021-03-08 NOTE — ED Triage Notes (Addendum)
Patient from [facility], staff stated to [facility] that she was complaining of feeling bloated.  Pt does have tube feeding and it was turned off.  She vomited after it being turned off, projectile vomiting.  She then started to leak around peg tube. Patient does have some lower abdominal pain. 156/70, 91 NSR, 20-28 RR.  CBG of 148.

## 2021-03-09 ENCOUNTER — Inpatient Hospital Stay (HOSPITAL_COMMUNITY): Payer: Medicare Other

## 2021-03-09 DIAGNOSIS — G2581 Restless legs syndrome: Secondary | ICD-10-CM | POA: Diagnosis present

## 2021-03-09 DIAGNOSIS — K56609 Unspecified intestinal obstruction, unspecified as to partial versus complete obstruction: Secondary | ICD-10-CM | POA: Diagnosis not present

## 2021-03-09 DIAGNOSIS — Z20822 Contact with and (suspected) exposure to covid-19: Secondary | ICD-10-CM | POA: Diagnosis present

## 2021-03-09 DIAGNOSIS — I129 Hypertensive chronic kidney disease with stage 1 through stage 4 chronic kidney disease, or unspecified chronic kidney disease: Secondary | ICD-10-CM | POA: Diagnosis present

## 2021-03-09 DIAGNOSIS — Z79899 Other long term (current) drug therapy: Secondary | ICD-10-CM | POA: Diagnosis not present

## 2021-03-09 DIAGNOSIS — G20 Parkinson's disease: Secondary | ICD-10-CM | POA: Diagnosis present

## 2021-03-09 DIAGNOSIS — Z794 Long term (current) use of insulin: Secondary | ICD-10-CM | POA: Diagnosis not present

## 2021-03-09 DIAGNOSIS — Z9911 Dependence on respirator [ventilator] status: Secondary | ICD-10-CM | POA: Diagnosis not present

## 2021-03-09 DIAGNOSIS — J449 Chronic obstructive pulmonary disease, unspecified: Secondary | ICD-10-CM | POA: Diagnosis present

## 2021-03-09 DIAGNOSIS — Z79890 Hormone replacement therapy (postmenopausal): Secondary | ICD-10-CM | POA: Diagnosis not present

## 2021-03-09 DIAGNOSIS — Z931 Gastrostomy status: Secondary | ICD-10-CM | POA: Diagnosis not present

## 2021-03-09 DIAGNOSIS — M109 Gout, unspecified: Secondary | ICD-10-CM | POA: Diagnosis present

## 2021-03-09 DIAGNOSIS — E1122 Type 2 diabetes mellitus with diabetic chronic kidney disease: Secondary | ICD-10-CM | POA: Diagnosis present

## 2021-03-09 DIAGNOSIS — Z8249 Family history of ischemic heart disease and other diseases of the circulatory system: Secondary | ICD-10-CM | POA: Diagnosis not present

## 2021-03-09 DIAGNOSIS — N182 Chronic kidney disease, stage 2 (mild): Secondary | ICD-10-CM | POA: Diagnosis present

## 2021-03-09 DIAGNOSIS — Z7901 Long term (current) use of anticoagulants: Secondary | ICD-10-CM | POA: Diagnosis not present

## 2021-03-09 DIAGNOSIS — E1165 Type 2 diabetes mellitus with hyperglycemia: Secondary | ICD-10-CM | POA: Diagnosis present

## 2021-03-09 DIAGNOSIS — Z8701 Personal history of pneumonia (recurrent): Secondary | ICD-10-CM | POA: Diagnosis not present

## 2021-03-09 DIAGNOSIS — J9611 Chronic respiratory failure with hypoxia: Secondary | ICD-10-CM | POA: Diagnosis present

## 2021-03-09 DIAGNOSIS — Z930 Tracheostomy status: Secondary | ICD-10-CM | POA: Diagnosis not present

## 2021-03-09 DIAGNOSIS — K567 Ileus, unspecified: Secondary | ICD-10-CM | POA: Diagnosis present

## 2021-03-09 DIAGNOSIS — E785 Hyperlipidemia, unspecified: Secondary | ICD-10-CM | POA: Diagnosis present

## 2021-03-09 DIAGNOSIS — R1110 Vomiting, unspecified: Secondary | ICD-10-CM | POA: Diagnosis present

## 2021-03-09 DIAGNOSIS — E0590 Thyrotoxicosis, unspecified without thyrotoxic crisis or storm: Secondary | ICD-10-CM | POA: Diagnosis present

## 2021-03-09 DIAGNOSIS — K432 Incisional hernia without obstruction or gangrene: Secondary | ICD-10-CM | POA: Diagnosis present

## 2021-03-09 LAB — MRSA NEXT GEN BY PCR, NASAL: MRSA by PCR Next Gen: DETECTED — AB

## 2021-03-09 LAB — GLUCOSE, CAPILLARY: Glucose-Capillary: 155 mg/dL — ABNORMAL HIGH (ref 70–99)

## 2021-03-09 LAB — RESP PANEL BY RT-PCR (FLU A&B, COVID) ARPGX2
Influenza A by PCR: NEGATIVE
Influenza B by PCR: NEGATIVE
SARS Coronavirus 2 by RT PCR: NEGATIVE

## 2021-03-09 LAB — PROCALCITONIN: Procalcitonin: <0.1 ng/mL

## 2021-03-09 LAB — CBG MONITORING, ED: Glucose-Capillary: 137 mg/dL — ABNORMAL HIGH (ref 70–99)

## 2021-03-09 MED ORDER — CHLORHEXIDINE GLUCONATE 0.12% ORAL RINSE (MEDLINE KIT)
15.0000 mL | Freq: Two times a day (BID) | OROMUCOSAL | Status: DC
  Administered 2021-03-09 – 2021-03-10 (×3): 15 mL via OROMUCOSAL

## 2021-03-09 MED ORDER — CHLORHEXIDINE GLUCONATE CLOTH 2 % EX PADS
6.0000 | MEDICATED_PAD | Freq: Every day | CUTANEOUS | Status: DC
  Administered 2021-03-09 – 2021-03-10 (×3): 6 via TOPICAL

## 2021-03-09 MED ORDER — DIATRIZOATE MEGLUMINE & SODIUM 66-10 % PO SOLN
90.0000 mL | Freq: Once | ORAL | Status: DC
  Filled 2021-03-09 (×2): qty 90

## 2021-03-09 MED ORDER — PANTOPRAZOLE SODIUM 40 MG IV SOLR
40.0000 mg | Freq: Every day | INTRAVENOUS | Status: DC
  Administered 2021-03-09: 40 mg via INTRAVENOUS
  Filled 2021-03-09: qty 40

## 2021-03-09 MED ORDER — INSULIN ASPART 100 UNIT/ML IJ SOLN
0.0000 [IU] | INTRAMUSCULAR | Status: DC
  Administered 2021-03-09 (×2): 2 [IU] via SUBCUTANEOUS
  Administered 2021-03-09: 4 [IU] via SUBCUTANEOUS
  Administered 2021-03-09 – 2021-03-10 (×4): 2 [IU] via SUBCUTANEOUS
  Administered 2021-03-10: 4 [IU] via SUBCUTANEOUS

## 2021-03-09 MED ORDER — IPRATROPIUM-ALBUTEROL 0.5-2.5 (3) MG/3ML IN SOLN
3.0000 mL | Freq: Four times a day (QID) | RESPIRATORY_TRACT | Status: DC
  Administered 2021-03-09 – 2021-03-10 (×4): 3 mL via RESPIRATORY_TRACT
  Filled 2021-03-09 (×4): qty 3

## 2021-03-09 MED ORDER — CARBIDOPA-LEVODOPA 25-100 MG PO TABS
1.0000 | ORAL_TABLET | Freq: Four times a day (QID) | ORAL | Status: DC
  Filled 2021-03-09 (×2): qty 1

## 2021-03-09 MED ORDER — POLYETHYLENE GLYCOL 3350 17 G PO PACK: 17.0000 g | PACK | Freq: Every day | ORAL | Status: DC | PRN

## 2021-03-09 MED ORDER — PROPYLTHIOURACIL 50 MG PO TABS
50.0000 mg | ORAL_TABLET | Freq: Two times a day (BID) | ORAL | Status: DC
  Filled 2021-03-09: qty 1

## 2021-03-09 MED ORDER — DEXTROSE-NACL 5-0.45 % IV SOLN: INTRAVENOUS | Status: DC

## 2021-03-09 MED ORDER — ONDANSETRON HCL 4 MG/2ML IJ SOLN
4.0000 mg | Freq: Once | INTRAMUSCULAR | Status: AC
  Administered 2021-03-09: 4 mg via INTRAVENOUS
  Filled 2021-03-09: qty 2

## 2021-03-09 MED ORDER — MUPIROCIN 2 % EX OINT
TOPICAL_OINTMENT | Freq: Two times a day (BID) | CUTANEOUS | Status: DC
  Administered 2021-03-10: 1 via NASAL
  Filled 2021-03-09 (×2): qty 22

## 2021-03-09 MED ORDER — ORAL CARE MOUTH RINSE
15.0000 mL | OROMUCOSAL | Status: DC
  Administered 2021-03-09 – 2021-03-10 (×12): 15 mL via OROMUCOSAL

## 2021-03-09 MED ORDER — FENTANYL CITRATE (PF) 100 MCG/2ML IJ SOLN
100.0000 ug | Freq: Once | INTRAMUSCULAR | Status: AC
  Administered 2021-03-09: 100 ug via INTRAVENOUS
  Filled 2021-03-09: qty 2

## 2021-03-09 NOTE — ED Provider Notes (Signed)
I assumed care in signout to follow-up on CT imaging.  Patient presents from a local long-term acute care facility chronically on a ventilator as well as having a PEG tube in place she presented with vomiting and low-grade fevers CT imaging can for small bowel obstruction as well as a Richter hernia.  On exam patient is awake and alert, currently on the dilator.  She has significant abdominal tenderness and distention  Discussed the case with Dr. Georganna Skeans.  He was see the patient in consultation.  He recommends NG tube  Discussed with critical care as patient will need to go to the ICU due to chronic vent dependence  .Critical Care E&M  Date/Time: 03/09/2021 1:34 AM Performed by: Ripley Fraise, MD  Critical care provider statement:    Critical care time (minutes):  34   Critical care start time:  03/09/2021 1:00 AM   Critical care end time:  03/09/2021 1:34 AM   Critical care time was exclusive of:  Separately billable procedures and treating other patients   Critical care was necessary to treat or prevent imminent or life-threatening deterioration of the following conditions:  Dehydration and respiratory failure   Critical care was time spent personally by me on the following activities:  Discussions with consultants, evaluation of patient's response to treatment, examination of patient, ordering and review of radiographic studies, re-evaluation of patient's condition and ordering and performing treatments and interventions   I assumed direction of critical care for this patient from another provider in my specialty: yes     Care discussed with: admitting provider   After initial E/M assessment, critical care services were subsequently performed that were exclusive of separately billable procedures or treatment.      Ripley Fraise, MD 03/09/21 [phone]

## 2021-03-09 NOTE — H&P (Addendum)
NAME:  Lindsey Collins, MRN:  [id number], DOB:  [birth year], LOS: 0 ADMISSION DATE:  03/08/2021, CONSULTATION DATE:  03/09/21 REFERRING MD:  Christy Gentles  CHIEF COMPLAINT:  Bloating, vomiting.     History of Present Illness:  Lindsey Collins is a 75 year old woman from [place], vent dependent, hx of CKD, Parkinson's and DM.   Presented from [facility].  Bloating x 2 days, vomiting, leaking around PEG site, lower abdominal pain.   Loose stools x 2 days.   Rectal temp 100.2 in ED  Pertinent  Medical History  DM 2  Parkinson's Disease  Hx PNA, hx ARDS.  Trach, vent dependent.  CKD Stage II COPD (per chart) Gout? Afib? HLD   Significant Hospital Events: Including procedures, antibiotic start and stop dates in addition to other pertinent events     Interim History / Subjective:    Objective   Blood pressure 136/68, pulse 98, temperature 100.2 F (37.9 C), temperature source Rectal, resp. rate (!) 22, height 5\' 4"  (1.626 m), weight 95 kg, SpO2 99 %.    Vent Mode: PRVC FiO2 (%):  [30 %-100 %] 30 % Set Rate:  [18 bmp] 18 bmp Vt Set:  [430 mL] 430 mL PEEP:  [5 cmH20] 5 cmH20 Plateau Pressure:  [30 cmH20] 30 cmH20  No intake or output data in the 24 hours ending 03/09/21 0208 Filed Weights   03/08/21 1918  Weight: 95 kg    Examination: General: NAD, on vent, HENT: some white mucous in trach suction Lungs: CTAB Cardiovascular: rrr no mgr Abdomen: distended, mild tenderness Extremities: non pitting trace edema Neuro: awake responsive and appropriate toquestions, eyes closed with not speaking  GU: Purewick  Labs/imaging that I have personally reviewed  (right click and "Reselect all SmartList Selections" daily)  CT abd/pelv w.o contrast:  IMPRESSION: 1. Features concerning for a small-bowel obstruction with possible small bowel transition point suspected in the vicinity of a Richter's type hernia in the right mid abdomen (3/69). 2. Percutaneous gastrostomy catheter along the  greater curvature of the stomach. Stomach appears well apposed to the anterior abdominal wall without gross complication visible at this time. 3. Moderate to large colonic stool burden, correlate for features of constipation. 4. Chronic atrophy of the lower pole left kidney. 5. Chronic architectural distortion and scarring in the left lung base with associated volume loss. 6.  Aortic Atherosclerosis (ICD10-I70.0).  CXR    IMPRESSION: Low lung volumes with mild left basilar atelectasis and/or infiltrate.  Resolved Hospital Problem list     Assessment & Plan:  SBO: symptoms present.  Seen on CT.  Surgery consulted by ED, coming to see patient.  NPO  PEG to intermittent suction.  C dif test P  Hold most of po meds Hold elequis - possible need for suregery  Fever: unclear source.  UA and bl cultures P   DM2  ISS  PD Carbidoba levidopa Best Practice (right click and "Reselect all SmartList Selections" daily)   Diet/type: NPO Pain/Anxiety/Delirium protocol Not indicated VAP protocol (if indicated): Yes DVT prophylaxis: other GI prophylaxis: N/A Glucose control:  SSI Central venous access:  N/A Arterial line:  N/A Foley:  N/A Mobility:  bed rest  PT consulted: N/A Studies pending: None Culture data pending:urine  and blood Last reviewed culture data:today Antibiotics:not indicated.  Antibiotic de-escalation: N/A Stop date: N/A Code Status:  full code Last date of multidisciplinary goals of care discussion []  ccm prognosis: stable  Disposition: remains critically ill, will stay in intensive care  Labs   CBC: Recent Labs  Lab 03/08/21 2004 03/08/21 2101  WBC  --  9.2  NEUTROABS  --  7.7  HGB 10.5* 10.7*  HCT 31.0* 36.3  MCV  --  93.1  PLT  --  825    Basic Metabolic Panel: Recent Labs  Lab 03/08/21 2004 03/08/21 2101  NA 134* 138  K 4.3 5.0  CL  --  100  CO2  --  29  GLUCOSE  --  156*  BUN  --  20  CREATININE  --  0.81  CALCIUM  --  8.9    GFR: Estimated Creatinine Clearance: 68.1 mL/min (by C-G formula based on SCr of 0.81 mg/dL). Recent Labs  Lab 03/08/21 2101  WBC 9.2    Liver Function Tests: Recent Labs  Lab 03/08/21 2101  AST 14*  ALT 11  ALKPHOS 56  BILITOT 0.5  PROT 7.3  ALBUMIN 3.2*   Recent Labs  Lab 03/08/21 2101  LIPASE 19   No results for input(s): AMMONIA in the last 168 hours.  ABG    Component Value Date/Time   PHART 7.516 (H) 03/08/2021 2004   PCO2ART 45.1 03/08/2021 2004   PO2ART 291 (H) 03/08/2021 2004   HCO3 36.2 (H) 03/08/2021 2004   TCO2 38 (H) 03/08/2021 2004   O2SAT 100.0 03/08/2021 2004     Coagulation Profile: No results for input(s): INR, PROTIME in the last 168 hours.  Cardiac Enzymes: No results for input(s): CKTOTAL, CKMB, CKMBINDEX, TROPONINI in the last 168 hours.  HbA1C: Hgb A1c MFr Bld  Date/Time Value Ref Range Status  12/20/2020 01:17 PM 7.2 (H) 4.8 - 5.6 % Final    Comment:    (NOTE) Pre diabetes:          5.7%-6.4%  Diabetes:              >6.4%  Glycemic control for   <7.0% adults with diabetes     CBG: No results for input(s): GLUCAP in the last 168 hours.  Review of Systems:   Unable to assess  Past Medical History:  She,  has a past medical history of Acute on chronic respiratory failure with hypoxia ([facility]), ARDS (adult respiratory distress syndrome) ([place]), Chronic kidney disease, stage II (mild), MRSA (methicillin resistant staphylococcus aureus) pneumonia ([place]), and Obstructive chronic bronchitis without exacerbation ([place]).   Surgical History:   Past Surgical History:  Procedure Laterality Date   TRACHEOSTOMY       Social History:   reports that she has never smoked. She has never used smokeless tobacco.   Family History:  Her family history includes CAD in her mother.   Allergies No Known Allergies   Home Medications  Prior to Admission medications   Medication Sig Start Date End Date Taking? Authorizing Provider   allopurinol (ZYLOPRIM) 100 MG tablet Take 200 mg by mouth daily.    [provider]  amiodarone (PACERONE) 200 MG tablet Take 200 mg by mouth daily. 10/25/20   [provider]  atorvastatin (LIPITOR) 20 MG tablet Take 20 mg by mouth at bedtime. 11/26/20   [provider]  carbidopa-levodopa (SINEMET IR) 25-100 MG tablet Take 1 tablet by mouth every 6 (six) hours. Times given : 00:00, 0600; 12:00, 1800 10/28/20   [provider]  chlorhexidine (PERIDEX) 0.12 % solution Use as directed 15 mLs in the mouth or throat 2 (two) times daily.    [provider]  cholecalciferol (VITAMIN D3) 25 MCG (1000 UNIT) tablet  Take 1,000 Units by mouth daily.    [provider]  divalproex (DEPAKOTE SPRINKLE) 125 MG capsule Take 500 mg by mouth 2 (two) times daily. 11/24/20   [provider]  ELIQUIS 5 MG TABS tablet Take 5 mg by mouth 2 (two) times daily. 12/02/20   [provider]  FLUoxetine (PROZAC) 10 MG tablet Take 10 mg by mouth daily. 11/08/20   [provider]  furosemide (LASIX) 20 MG tablet Take 20 mg by mouth every 12 (twelve) hours. 12/02/20   [provider]  guaiFENesin 200 MG tablet Take 400 mg by mouth every 8 (eight) hours as needed for cough or to loosen phlegm.    [provider]  insulin aspart (NOVOLOG) 100 UNIT/ML injection Inject 0-10 Units into the skin every 6 (six) hours. Sliding scale: CBG 150 = 0 units, 151-200 = 2 units, 201-250 = 4 units, 251-300 = 6 units, 301-350 = 8 units, 351-400 = 10 units.    [provider]  insulin glargine (LANTUS) 100 UNIT/ML injection Inject 44 Units into the skin at bedtime.    [provider]  ipratropium-albuterol (DUONEB) 0.5-2.5 (3) MG/3ML SOLN Take 3 mLs by nebulization every 4 (four) hours as needed (shortness of breath).    [provider]  magnesium oxide (MAG-OX) 400 MG tablet Take 400 mg by mouth 2 (two) times daily.    [provider]  melatonin 3 MG TABS tablet Take 6 mg by mouth at bedtime.    [provider]  memantine (NAMENDA) 5 MG tablet Take 5 mg by mouth 2 (two) times daily. 11/26/20   [provider]  metoprolol tartrate (LOPRESSOR) 25 MG tablet Take 25 mg by mouth 2 (two) times daily.    [provider]  Nutritional Supplements (FEEDING SUPPLEMENT, GLUCERNA 1.2 CAL,) LIQD Place 1,000 mLs into feeding tube continuous. 60 ml /hr 100q4    [provider]  OLANZapine (ZYPREXA) 10 MG tablet Take 10 mg by mouth at bedtime. 11/16/20   [provider]  pantoprazole sodium (PROTONIX) 40 mg/20 mL PACK Take 40 mg by mouth daily.    [provider]  polyethylene glycol (MIRALAX / GLYCOLAX) 17 g packet Take 17 g by mouth daily.    [provider]  Potassium Chloride 40 MEQ/15ML (20%) SOLN Take 40 mEq by mouth daily.    [provider]  pramipexole (MIRAPEX) 0.125 MG tablet Take 0.125 mg by mouth every 8 (eight) hours. 11/05/20   [provider]  propylthiouracil (PTU) 50 MG tablet Take 50 mg by mouth every 12 (twelve) hours. 10/19/20   [provider]  senna-docusate (SENOKOT-S) 8.6-50 MG tablet Take 2 tablets by mouth at bedtime.    [provider]     Critical care time: 60 min.

## 2021-03-09 NOTE — Plan of Care (Signed)

## 2021-03-09 NOTE — Progress Notes (Signed)
Admitted to 2H-18 from ED. Report received from [place], [place]. On chronic ventilator, tracheostomy 6 mm cuffed xl shiley. Nods head "no" when asked if she is in pain or having any nausea. PEG tube patent and orders to low intermittent suction. Pure wick placed. MRSA swab sent to lab. No wounds noted on skin assessment. PIV 22 G RFA intact. Call light in reach and explained call system. VS stable. Continuing to monitor.

## 2021-03-09 NOTE — Consult Note (Addendum)
Reason for Consult:SBO Referring Physician: Rilynne Lonsway is an 75 y.o. female.  HPI: 75yo F patient from [facility], vent dependent, CKD, Parkinsos's, and DM. She was brought to the ED C/O bloating for 2 days, some leaking at PEG site, lower abdominal pain and loose stools. CT A/P shows SBO and I was asked to evaluate. She is on the ventilator via a trach.  Past Medical History:  Diagnosis Date   Acute on chronic respiratory failure with hypoxia (HCC)    ARDS (adult respiratory distress syndrome) (HCC)    Chronic kidney disease, stage II (mild)    MRSA (methicillin resistant staphylococcus aureus) pneumonia (HCC)    Obstructive chronic bronchitis without exacerbation (HCC)     Past Surgical History:  Procedure Laterality Date   TRACHEOSTOMY      Family History  Problem Relation Age of Onset   CAD Mother        hx CABG    Social History:  reports that she has never smoked. She has never used smokeless tobacco. No history on file for alcohol use and drug use.  Allergies: No Known Allergies  Medications: I have reviewed the patient's current medications.  Results for orders placed or performed during the hospital encounter of 03/08/21 (from the past 48 hour(s))  I-Stat arterial blood gas, ED     Status: Abnormal   Collection Time: 03/08/21  8:04 PM  Result Value Ref Range   pH, Arterial 7.516 (H) 7.350 - 7.450   pCO2 arterial 45.1 32.0 - 48.0 mmHg   pO2, Arterial 291 (H) 83.0 - 108.0 mmHg   Bicarbonate 36.2 (H) 20.0 - 28.0 mmol/L   TCO2 38 (H) 22 - 32 mmol/L   O2 Saturation 100.0 %   Acid-Base Excess 12.0 (H) 0.0 - 2.0 mmol/L   Sodium 134 (L) 135 - 145 mmol/L   Potassium 4.3 3.5 - 5.1 mmol/L   Calcium, Ion 1.19 1.15 - 1.40 mmol/L   HCT 31.0 (L) 36.0 - 46.0 %   Hemoglobin 10.5 (L) 12.0 - 15.0 g/dL   Patient temperature 100.0 F    Sample type ARTERIAL   Comprehensive metabolic panel     Status: Abnormal   Collection Time: 03/08/21  9:01 PM  Result Value  Ref Range   Sodium 138 135 - 145 mmol/L   Potassium 5.0 3.5 - 5.1 mmol/L   Chloride 100 98 - 111 mmol/L   CO2 29 22 - 32 mmol/L   Glucose, Bld 156 (H) 70 - 99 mg/dL    Comment: Glucose reference range applies only to samples taken after fasting for at least 8 hours.   BUN 20 8 - 23 mg/dL   Creatinine, Ser 0.81 0.44 - 1.00 mg/dL   Calcium 8.9 8.9 - 10.3 mg/dL   Total Protein 7.3 6.5 - 8.1 g/dL   Albumin 3.2 (L) 3.5 - 5.0 g/dL   AST 14 (L) 15 - 41 U/L   ALT 11 0 - 44 U/L   Alkaline Phosphatase 56 38 - 126 U/L   Total Bilirubin 0.5 0.3 - 1.2 mg/dL   GFR, Estimated >60 >60 mL/min    Comment: (NOTE) Calculated using the CKD-EPI Creatinine Equation (2021)    Anion gap 9 5 - 15    Comment: Performed at [facility] [address] [place], [place] [address]  Lipase, blood     Status: None   Collection Time: 03/08/21  9:01 PM  Result Value Ref Range   Lipase  19 11 - 51 U/L    Comment: Performed at [facility], [place] [address], [place], [place] [address]  CBC with Differential     Status: Abnormal   Collection Time: 03/08/21  9:01 PM  Result Value Ref Range   WBC 9.2 4.0 - 10.5 K/uL   RBC 3.90 3.87 - 5.11 MIL/uL   Hemoglobin 10.7 (L) 12.0 - 15.0 g/dL   HCT 36.3 36.0 - 46.0 %   MCV 93.1 80.0 - 100.0 fL   MCH 27.4 26.0 - 34.0 pg   MCHC 29.5 (L) 30.0 - 36.0 g/dL   RDW 16.2 (H) 11.5 - 15.5 %   Platelets 228 150 - 400 K/uL   nRBC 0.0 0.0 - 0.2 %   Neutrophils Relative % 82 %   Neutro Abs 7.7 1.7 - 7.7 K/uL   Lymphocytes Relative 10 %   Lymphs Abs 0.9 0.7 - 4.0 K/uL   Monocytes Relative 6 %   Monocytes Absolute 0.5 0.1 - 1.0 K/uL   Eosinophils Relative 1 %   Eosinophils Absolute 0.1 0.0 - 0.5 K/uL   Basophils Relative 0 %   Basophils Absolute 0.0 0.0 - 0.1 K/uL   Immature Granulocytes 1 %   Abs Immature Granulocytes 0.05 0.00 - 0.07 K/uL    Comment: Performed at [facility] [address], [place], [place] [address]  Resp Panel by RT-PCR (Flu A&B, Covid)  Nasopharyngeal Swab     Status: None   Collection Time: 03/09/21  1:59 AM   Specimen: Nasopharyngeal Swab; Nasopharyngeal(NP) swabs in vial transport medium  Result Value Ref Range   SARS Coronavirus 2 by RT PCR NEGATIVE NEGATIVE    Comment: (NOTE) SARS-CoV-2 target nucleic acids are NOT DETECTED.  The SARS-CoV-2 RNA is generally detectable in upper respiratory specimens during the acute phase of infection. The lowest concentration of SARS-CoV-2 viral copies this assay can detect is 138 copies/mL. A negative result does not preclude SARS-Cov-2 infection and should not be used as the sole basis for treatment or other patient management decisions. A negative result may occur with  improper specimen collection/handling, submission of specimen other than nasopharyngeal swab, presence of viral mutation(s) within the areas targeted by this assay, and inadequate number of viral copies(<138 copies/mL). A negative result must be combined with clinical observations, patient history, and epidemiological information. The expected result is Negative.  Fact Sheet for Patients:  EntrepreneurPulse.com.au  Fact Sheet for Healthcare Providers:  IncredibleEmployment.be  This test is no t yet approved or cleared by the [place] FDA and  has been authorized for detection and/or diagnosis of SARS-CoV-2 by FDA under an Emergency Use Authorization (EUA). This EUA will remain  in effect (meaning this test can be used) for the duration of the COVID-19 declaration under Section 564(b)(1) of the Act, 21 U.S.C.section 360bbb-3(b)(1), unless the authorization is terminated  or revoked sooner.       Influenza A by PCR NEGATIVE NEGATIVE   Influenza B by PCR NEGATIVE NEGATIVE    Comment: (NOTE) The Xpert Xpress SARS-CoV-2/FLU/RSV plus assay is intended as an aid in the diagnosis of influenza from Nasopharyngeal swab specimens and should not be used as a sole basis for  treatment. Nasal washings and aspirates are unacceptable for Xpert Xpress SARS-CoV-2/FLU/RSV testing.  Fact Sheet for Patients: EntrepreneurPulse.com.au  Fact Sheet for Healthcare Providers: IncredibleEmployment.be  This test is not yet approved or cleared by the [place] FDA and has been authorized for detection and/or diagnosis of  SARS-CoV-2 by FDA under an Emergency Use Authorization (EUA). This EUA will remain in effect (meaning this test can be used) for the duration of the COVID-19 declaration under Section 564(b)(1) of the Act, 21 U.S.C. section 360bbb-3(b)(1), unless the authorization is terminated or revoked.  Performed at [facility], [place] [address] [place], [place] [address]   POC CBG, ED     Status: Abnormal   Collection Time: 03/09/21  3:51 AM  Result Value Ref Range   Glucose-Capillary 137 (H) 70 - 99 mg/dL    Comment: Glucose reference range applies only to samples taken after fasting for at least 8 hours.    CT Abdomen Pelvis Wo Contrast  Result Date: 03/09/2021 CLINICAL DATA:  Bowel obstruction suspected, bloating leaking around PEG tube EXAM: CT ABDOMEN AND PELVIS WITHOUT CONTRAST TECHNIQUE: Multidetector CT imaging of the abdomen and pelvis was performed following the standard protocol without IV contrast. COMPARISON:  CT 01/07/2021 FINDINGS: Lower chest: Volume loss in the left lung base is similar to prior and may reflect some chronic scarring and or architectural distortion. Small left pleural effusion. Remaining portions of the lung bases are clear. Neck small left pleural effusion noted as well. Hepatobiliary: No visible focal liver lesion with limitations of an unenhanced CT. Smooth surface contour. Normal hepatic attenuation. Normal gallbladder and biliary tree. No visible calcified gallstones. Pancreas: Mild pancreatic atrophy. No pancreatic ductal dilatation or surrounding inflammatory changes. Spleen: Normal in  size. No concerning splenic lesions. Small accessory splenules. Adrenals/Urinary Tract: Normal adrenal glands. No worrisome visible or contour deforming renal lesions. 2.1 cm fluid attenuation cyst seen in the upper pole right kidney. Atrophy of the lower pole left kidney is similar to comparison prior. No urolithiasis or hydronephrosis. Urinary bladder is largely decompressed at the time of exam and therefore poorly evaluated by CT imaging. Mild bladder wall thickening may be related to underdistention. Stomach/Bowel: Distal esophagus is unremarkable. Stomach mildly distended with air and ingested material. Contains gastrostomy noted along the anterior wall of the distal gastric body which appears appropriately apposed to the anterior abdominal wall with inflated pexy balloon in the gastric lumen. No extraluminal gas or free fluid in the vicinity to suggest erosion or malpositioning of the gastrostomy tube. There are multiple air and fluid distended loops of small bowel in the mid abdomen. Partial herniation of the anti mesenteric wall of the small bowel is noted in a right paraumbilical abdominal wall defect (3/69) abdominal wall defect measures approximately a 1.3 by 2.1 cm craniocaudal by transverse dimension. Bowel appears to transition to more decompressed distal small bowel loops at or beyond this focus. No colonic dilatation or wall thickening. Rectosigmoid anastomosis appears patent. Vascular/Lymphatic: Atherosclerotic calcifications within the abdominal aorta and branch vessels. No aneurysm or ectasia. No enlarged abdominopelvic lymph nodes. Reproductive: Normal appearance of the uterus and adnexal structures. Scattered calcified pelvic phleboliths. Other: Right periumbilical bowel containing Richter's type hernia, as above. Small fat containing umbilical hernia noted as well. No other bowel containing hernia. No abdominopelvic free air or fluid. Musculoskeletal: Multilevel degenerative changes are present  in the imaged portions of the spine. IMPRESSION: 1. Features concerning for a small-bowel obstruction with possible small bowel transition point suspected in the vicinity of a Richter's type hernia in the right mid abdomen (3/69). 2. Percutaneous gastrostomy catheter along the greater curvature of the stomach. Stomach appears well apposed to the anterior abdominal wall without gross complication visible at this time. 3. Moderate to large colonic stool burden, correlate  for features of constipation. 4. Chronic atrophy of the lower pole left kidney. 5. Chronic architectural distortion and scarring in the left lung base with associated volume loss. 6.  Aortic Atherosclerosis (ICD10-I70.0). Electronically Signed   By: Lovena Le M.D.   On: 03/09/2021 00:55   DG Chest Port 1 View  Result Date: 03/08/2021 CLINICAL DATA:  Vomiting and lower abdominal pain. EXAM: PORTABLE CHEST 1 VIEW COMPARISON:  Feb 09, 2021 FINDINGS: There is stable tracheostomy tube positioning. Low lung volumes are noted which may be secondary to the degree of patient inspiration. Mild atelectasis and/or infiltrate is seen within the retrocardiac region of the left lung base. The cardiac silhouette is borderline in size. There is mild calcification of the aortic arch. The visualized skeletal structures are unremarkable. IMPRESSION: Low lung volumes with mild left basilar atelectasis and/or infiltrate. Electronically Signed   By: Virgina Norfolk M.D.   On: 03/08/2021 22:32    Review of Systems  Unable to perform ROS: Intubated  Blood pressure (!) 124/58, pulse 84, temperature 100.2 F (37.9 C), temperature source Rectal, resp. rate 20, height 5\' 4"  (1.626 m), weight 91 kg, SpO2 98 %. Physical Exam Constitutional:      Appearance: She is obese.  HENT:     Head: Normocephalic.     Nose: Nose normal.     Mouth/Throat:     Mouth: Mucous membranes are moist.  Eyes:     Pupils: Pupils are equal, round, and reactive to light.  Neck:      Comments: Tracheostomy Cardiovascular:     Rate and Rhythm: Normal rate and regular rhythm.     Pulses: Normal pulses.     Heart sounds: Normal heart sounds.  Pulmonary:     Effort: Pulmonary effort is normal.     Breath sounds: Normal breath sounds.     Comments: On vent Abdominal:     Tenderness: There is no guarding or rebound.     Hernia: A hernia is present.     Comments: Moderate distention, mild tenderness, she has an incisional hernia to the R of her umbilicus that easily reduces but does recur  Musculoskeletal:     Cervical back: Neck supple.     Comments: Mild edema  Skin:    General: Skin is warm.  Neurological:     Mental Status: She is alert.     Comments: Does F/C  Psychiatric:        Mood and Affect: Mood normal.    Assessment/Plan: SBO, incisional hernia - her hernia easily reduces and does not seem to be causing the SBO. This is likely from adhesions. SHe also has a large stool burden and is having loose stools. Agree with NGT. Will order SBO protocol. G tube looks fine on CT. No need for emergent surgery. We will follow with you.  Zenovia Jarred 03/09/2021, 6:47 AM

## 2021-03-09 NOTE — Progress Notes (Signed)
Attending:    Subjective: Admitted overnight by my partner. From [facility], vent dependent in setting of COPD, prior history of pneumonia/ARDS and parkinson's disease.  Admitted overnight for bloating, abdominal pain, loose stools, low grade temp.  CT abdomen worrisome for SBO.  Seen by general surgery who feels that the SBO is due to adhesions and not the reducible hernia.  Since admission she has refused the NG tube.  She says she has had resolution of her abdominal pain and she has had several bowel movements.  Objective: Vitals:   03/09/21 0404 03/09/21 0500 03/09/21 0753 03/09/21 0811  BP:  (!) 124/58    Pulse: 79 84  75  Resp: 18 20  18   Temp:   98.2 F (36.8 C)   TempSrc:   Axillary   SpO2: 99% 98%  100%  Weight:  91 kg    Height:       Vent Mode: PRVC FiO2 (%):  [30 %-100 %] 40 % Set Rate:  [18 bmp] 18 bmp Vt Set:  [430 mL] 430 mL PEEP:  [5 cmH20] 5 cmH20 Plateau Pressure:  [23 cmH20-30 cmH20] 23 cmH20  Intake/Output Summary (Last 24 hours) at 03/09/2021 1017 Last data filed at 03/09/2021 0600 Gross per 24 hour  Intake 15.06 ml  Output 200 ml  Net -184.94 ml    General:  Resting comfortably in bed HENT: NCAT tracheostomy in place PULM: Rhonchi bilaterally B, normal effort CV: RRR, no mgr GI: BS+, soft, PEG in place, no tenderness on my exam, mild distension MSK: normal bulk and tone Neuro: awake, alert, no distress, MAEW    CBC    Component Value Date/Time   WBC 9.2 03/08/2021 2101   RBC 3.90 03/08/2021 2101   HGB 10.7 (L) 03/08/2021 2101   HCT 36.3 03/08/2021 2101   PLT 228 03/08/2021 2101   MCV 93.1 03/08/2021 2101   MCH 27.4 03/08/2021 2101   MCHC 29.5 (L) 03/08/2021 2101   RDW 16.2 (H) 03/08/2021 2101   LYMPHSABS 0.9 03/08/2021 2101   MONOABS 0.5 03/08/2021 2101   EOSABS 0.1 03/08/2021 2101   BASOSABS 0.0 03/08/2021 2101    BMET    Component Value Date/Time   NA 138 03/08/2021 2101   K 5.0 03/08/2021 2101   CL 100 03/08/2021 2101   CO2 29  03/08/2021 2101   GLUCOSE 156 (H) 03/08/2021 2101   BUN 20 03/08/2021 2101   CREATININE 0.81 03/08/2021 2101   CALCIUM 8.9 03/08/2021 2101   GFRNONAA >60 03/08/2021 2101    CXR images reviewed, tracheostomy in place, ?emphysema?  Impression/Plan: Small bowel obstruction due to adhesions> appears to have some clinical improvement based on improvement in pain and tenderness, WBC down, has good bowel sounds. Refusing NG tube. Will keep her NPO and hold off on placing it unless she develops pain again.  Chronic respiratory failure with hypoxemia, ventilator dependent, tracheostomy dependent (all present on admission): continue full vent support, doesn't need sedation, no plans to wean vent, trach care per routine  Parksinson's disease: carbidopa/levodopa ordered, hold as NPO  Hyperthyroid: PTU ordered, hold as NPO  Seizure disorder? On depakote at baseline, restart 6/27 if bowel function improved  DM2 with hyperglycemia: SSI  Hypertension: normal vitals right now, hold catapress for now, monitor hemodynamics  COPD: start duoneb scheduled  Labs for tomorrow ordered  My cc time n/a minutes  Roselie Awkward, MD [place] [facility] Pager: [phone] Cell: [phone] After 3pm or if no response, call [phone]

## 2021-03-09 NOTE — ED Notes (Signed)
Spoke with daughter, Baxter Flattery.  Updated on plan of care.

## 2021-03-09 NOTE — Progress Notes (Signed)
Patient refuses NGT insertion at this time and wants to wait for daughter to get here. RN left message for daughter to contact the hospital as soon as possible. Will notify physician.

## 2021-03-10 LAB — GLUCOSE, CAPILLARY
Glucose-Capillary: 138 mg/dL — ABNORMAL HIGH (ref 70–99)
Glucose-Capillary: 146 mg/dL — ABNORMAL HIGH (ref 70–99)
Glucose-Capillary: 152 mg/dL — ABNORMAL HIGH (ref 70–99)
Glucose-Capillary: 152 mg/dL — ABNORMAL HIGH (ref 70–99)
Glucose-Capillary: 156 mg/dL — ABNORMAL HIGH (ref 70–99)
Glucose-Capillary: 162 mg/dL — ABNORMAL HIGH (ref 70–99)
Glucose-Capillary: 164 mg/dL — ABNORMAL HIGH (ref 70–99)

## 2021-03-10 MED ORDER — PROPYLTHIOURACIL 50 MG PO TABS
50.0000 mg | ORAL_TABLET | Freq: Three times a day (TID) | ORAL | Status: DC
  Administered 2021-03-10: 50 mg
  Filled 2021-03-10 (×3): qty 1

## 2021-03-10 MED ORDER — ATORVASTATIN CALCIUM 10 MG PO TABS: 20.0000 mg | ORAL_TABLET | Freq: Every day | ORAL | Status: DC

## 2021-03-10 MED ORDER — PANTOPRAZOLE SODIUM 40 MG PO PACK
40.0000 mg | PACK | Freq: Two times a day (BID) | ORAL | Status: DC
  Administered 2021-03-10: 40 mg
  Filled 2021-03-10: qty 20

## 2021-03-10 MED ORDER — VITAL HIGH PROTEIN PO LIQD: 1000.0000 mL | ORAL | Status: DC

## 2021-03-10 MED ORDER — CARBIDOPA-LEVODOPA 25-100 MG PO TABS
1.0000 | ORAL_TABLET | Freq: Four times a day (QID) | ORAL | Status: DC
  Administered 2021-03-10 (×2): 1
  Filled 2021-03-10 (×3): qty 1

## 2021-03-10 MED ORDER — BISACODYL 10 MG RE SUPP: 10.0000 mg | Freq: Every day | RECTAL | Status: DC

## 2021-03-10 MED ORDER — ACETAMINOPHEN 325 MG PO TABS: 650.0000 mg | ORAL_TABLET | Freq: Four times a day (QID) | ORAL | Status: DC | PRN

## 2021-03-10 MED ORDER — MEMANTINE HCL 5 MG PO TABS
5.0000 mg | ORAL_TABLET | Freq: Two times a day (BID) | ORAL | Status: DC
  Administered 2021-03-10: 5 mg
  Filled 2021-03-10 (×2): qty 1

## 2021-03-10 MED ORDER — FLUOXETINE HCL 20 MG/5ML PO SOLN
10.0000 mg | Freq: Every day | ORAL | Status: DC
  Administered 2021-03-10: 10 mg
  Filled 2021-03-10: qty 5

## 2021-03-10 MED ORDER — DIVALPROEX SODIUM 125 MG PO CSDR
500.0000 mg | DELAYED_RELEASE_CAPSULE | Freq: Two times a day (BID) | ORAL | Status: DC
  Filled 2021-03-10: qty 4

## 2021-03-10 MED ORDER — VALPROIC ACID 250 MG/5ML PO SOLN
250.0000 mg | Freq: Four times a day (QID) | ORAL | Status: DC
  Administered 2021-03-10: 250 mg
  Filled 2021-03-10: qty 5

## 2021-03-10 MED ORDER — AMIODARONE HCL 200 MG PO TABS
200.0000 mg | ORAL_TABLET | Freq: Every day | ORAL | Status: DC
  Administered 2021-03-10: 200 mg
  Filled 2021-03-10: qty 1

## 2021-03-10 MED ORDER — POLYETHYLENE GLYCOL 3350 17 G PO PACK: 17.0000 g | PACK | Freq: Every day | ORAL | Status: DC

## 2021-03-10 MED ORDER — PRAMIPEXOLE DIHYDROCHLORIDE 0.125 MG PO TABS
0.1250 mg | ORAL_TABLET | Freq: Three times a day (TID) | ORAL | Status: DC
  Administered 2021-03-10: 0.125 mg
  Filled 2021-03-10 (×3): qty 1

## 2021-03-10 MED ORDER — ALLOPURINOL 100 MG PO TABS
200.0000 mg | ORAL_TABLET | Freq: Every day | ORAL | Status: DC
  Administered 2021-03-10: 200 mg
  Filled 2021-03-10: qty 2

## 2021-03-10 MED ORDER — VALPROIC ACID 250 MG/5ML PO SOLN: 250.0000 mg | Freq: Four times a day (QID) | ORAL | Status: DC

## 2021-03-10 MED ORDER — ENOXAPARIN SODIUM 40 MG/0.4ML IJ SOSY
40.0000 mg | PREFILLED_SYRINGE | INTRAMUSCULAR | Status: DC
  Administered 2021-03-10: 40 mg via SUBCUTANEOUS
  Filled 2021-03-10: qty 0.4

## 2021-03-10 MED ORDER — VITAL HIGH PROTEIN PO LIQD
1000.0000 mL | ORAL | Status: DC
  Administered 2021-03-10: 1000 mL

## 2021-03-10 MED ORDER — OLANZAPINE 10 MG PO TABS
10.0000 mg | ORAL_TABLET | Freq: Every day | ORAL | Status: DC
  Filled 2021-03-10: qty 1

## 2021-03-10 MED ORDER — METOPROLOL TARTRATE 25 MG PO TABS
25.0000 mg | ORAL_TABLET | Freq: Two times a day (BID) | ORAL | Status: DC
  Administered 2021-03-10: 25 mg
  Filled 2021-03-10: qty 1

## 2021-03-10 MED ORDER — MAGNESIUM OXIDE -MG SUPPLEMENT 400 (240 MG) MG PO TABS
400.0000 mg | ORAL_TABLET | Freq: Two times a day (BID) | ORAL | Status: DC
  Administered 2021-03-10: 400 mg
  Filled 2021-03-10: qty 1

## 2021-03-10 MED ORDER — SENNOSIDES-DOCUSATE SODIUM 8.6-50 MG PO TABS: 2.0000 | ORAL_TABLET | Freq: Every day | ORAL | Status: DC

## 2021-03-10 NOTE — TOC Transition Note (Signed)
Transition of Care [facility]) - CM/SW Discharge Note   Patient Details  Name: Lindsey Collins MRN: [id number] Date of Birth: 17-Mar-1946  Transition of Care [facility]) CM/SW Contact:  Bethann Berkshire, Luke Phone Number: 03/10/2021, 12:45 PM   Clinical Narrative:     Patient will DC to: [facility] Anticipated DC date: 03/10/21 Family notified: SHAWNE, BULOW (Daughter)  [phone] [facility] Phone) Transport by: Karen Chafe   Per MD patient ready for DC to [facility]. RN, patient, patient's family, and facility notified of DC. Discharge Summary and FL2 sent to facility. Townsend Roger is receiving MD and RN to call report to [phone] DC packet on chart. Ambulance transport requested for patient.   CSW will sign off for now as social work intervention is no longer needed. Please consult [place] again if new needs arise.   Final next level of care: Skilled Nursing Facility Barriers to Discharge: No Barriers Identified   Patient Goals and CMS Choice        Discharge Placement              Patient chooses bed at:  ([facility]) Patient to be transferred to facility by: [place] Name of family member notified: NIKERIA, KALMAN (Daughter)   [phone] (Home Phone) Patient and family notified of of transfer: 03/10/21  Discharge Plan and Services                                     Social Determinants of Health (SDOH) Interventions     Readmission Risk Interventions No flowsheet data found.

## 2021-03-10 NOTE — Progress Notes (Signed)
NAME:  Lindsey Collins, MRN:  [id number], DOB:  08-31-1946, LOS: 1 ADMISSION DATE:  03/08/2021, CONSULTATION DATE:  03/09/21 REFERRING MD:  Christy Gentles  CHIEF COMPLAINT:  Bloating, vomiting.     History of Present Illness:  Lindsey Collins is a 75 year old woman from [place], vent dependent, hx of CKD, Parkinson's and DM.   Presented from [place].  Bloating x 2 days, vomiting, leaking around PEG site, lower abdominal pain.   Loose stools x 2 days.   Rectal temp 100.2 in ED  Pertinent  Medical History  DM 2  Parkinson's Disease  Hx PNA, hx ARDS.  Trach, vent dependent.  CKD Stage II COPD (per chart) Gout? Afib? [place] Hospital Events: Including procedures, antibiotic start and stop dates in addition to other pertinent events   6/26 admitted  Interim History / Subjective:  Abdomen feels better with decompression.  Wants to go back to [facility].  Having Bms.  Objective   Blood pressure (!) 120/53, pulse 71, temperature 98.1 F (36.7 C), temperature source Axillary, resp. rate 18, height '5\' 4"'  (1.626 m), weight 91 kg, SpO2 100 %.    Vent Mode: PRVC FiO2 (%):  [40 %] 40 % Set Rate:  [18 bmp] 18 bmp Vt Set:  [430 mL] 430 mL PEEP:  [5 cmH20] 5 cmH20 Plateau Pressure:  [21 cmH20-23 cmH20] 21 cmH20   Intake/Output Summary (Last 24 hours) at 03/10/2021 3428 Last data filed at 03/10/2021 0600 Gross per 24 hour  Intake 1700.77 ml  Output 850 ml  Net 850.77 ml   Filed Weights   03/08/21 1918 03/09/21 0500  Weight: 95 kg 91 kg    Examination: Constitutional: chronically ill appearing woman in NAD  Eyes: EOMI, pupils equal Ears, nose, mouth, and throat: trach in place, minimal secretions Cardiovascular: RRR, ext warm Respiratory: scattered rhonci, no wheezing, triggers vent Gastrointestinal: soft, reducible hernia, +BS, minimal TTP Skin: No rashes, normal turgor Neurologic: moves all 4 ext  Psychiatric: nodding appropriately to questions   Labs/imaging that I have  personally reviewed  (right click and "Reselect all SmartList Selections" daily)  CT abd/pelv w.o contrast:  IMPRESSION: 1. Features concerning for a small-bowel obstruction with possible small bowel transition point suspected in the vicinity of a Richter's type hernia in the right mid abdomen (3/69). 2. Percutaneous gastrostomy catheter along the greater curvature of the stomach. Stomach appears well apposed to the anterior abdominal wall without gross complication visible at this time. 3. Moderate to large colonic stool burden, correlate for features of constipation. 4. Chronic atrophy of the lower pole left kidney. 5. Chronic architectural distortion and scarring in the left lung base with associated volume loss. 6.  Aortic Atherosclerosis (ICD10-I70.0).  CXR    IMPRESSION: Low lung volumes with mild left basilar atelectasis and/or infiltrate.  Refused labs draws  Resolved Hospital Problem list     Assessment & Plan:  Chronic hypoxemic respiratory failure Question of SBO but I wonder if just ileus related to Parkinson's Reducible incisional hernia DM Parkinson's CKD Hyperthyroidism RLS  - Will discuss with CCS: would rechallenge with TF and if tolerates, advance with plans to potentially return to [facility] tomorrow - Start suppositories - Continue vent support - SSI - Resume myriad of home meds  Best Practice (right click and "Reselect all SmartList Selections" daily)   Best practice:  Diet: trickles Pain/Anxiety/Delirium protocol (if indicated): n/a VAP protocol (if indicated): in place DVT prophylaxis: lovenox GI prophylaxis: PPI Glucose control: SSI for now,  adjust PRN Mobility: BR Code Status: full Family Communication: updated patient Disposition: ICU until ready to return to [facility]  Erskine Emery MD PCCM

## 2021-03-10 NOTE — Progress Notes (Signed)
Subjective/Chief Complaint: Patient is having multiple loose stools X-ray - contrast in rectum   Objective: Vital signs in last 24 hours: Temp:  [97.6 F (36.4 C)-98.2 F (36.8 C)] 98.2 F (36.8 C) (06/27 0741) Pulse Rate:  [65-91] 91 (06/27 0800) Resp:  [15-21] 17 (06/27 0800) BP: (98-128)/(44-85) 128/79 (06/27 0800) SpO2:  [92 %-100 %] 98 % (06/27 0800) FiO2 (%):  [40 %] 40 % (06/27 0800) Last BM Date: 03/09/21  Intake/Output from previous day: 06/26 0701 - 06/27 0700 In: 1700.8 [I.V.:1700.8] Out: 850 [Urine:500] Intake/Output this shift: Total I/O In: 142.5 [I.V.:142.5] Out: 50 [Other:50]  GI: Soft, non-tender, reducible hernia  Lab Results:  Recent Labs    03/08/21 2004 03/08/21 2101  WBC  --  9.2  HGB 10.5* 10.7*  HCT 31.0* 36.3  PLT  --  228   BMET Recent Labs    03/08/21 2004 03/08/21 2101  NA 134* 138  K 4.3 5.0  CL  --  100  CO2  --  29  GLUCOSE  --  156*  BUN  --  20  CREATININE  --  0.81  CALCIUM  --  8.9   PT/INR No results for input(s): LABPROT, INR in the last 72 hours. ABG Recent Labs    03/08/21 2004  PHART 7.516*  HCO3 36.2*    Studies/Results: CT Abdomen Pelvis Wo Contrast  Result Date: 03/09/2021 CLINICAL DATA:  Bowel obstruction suspected, bloating leaking around PEG tube EXAM: CT ABDOMEN AND PELVIS WITHOUT CONTRAST TECHNIQUE: Multidetector CT imaging of the abdomen and pelvis was performed following the standard protocol without IV contrast. COMPARISON:  CT 01/07/2021 FINDINGS: Lower chest: Volume loss in the left lung base is similar to prior and may reflect some chronic scarring and or architectural distortion. Small left pleural effusion. Remaining portions of the lung bases are clear. Neck small left pleural effusion noted as well. Hepatobiliary: No visible focal liver lesion with limitations of an unenhanced CT. Smooth surface contour. Normal hepatic attenuation. Normal gallbladder and biliary tree. No visible calcified  gallstones. Pancreas: Mild pancreatic atrophy. No pancreatic ductal dilatation or surrounding inflammatory changes. Spleen: Normal in size. No concerning splenic lesions. Small accessory splenules. Adrenals/Urinary Tract: Normal adrenal glands. No worrisome visible or contour deforming renal lesions. 2.1 cm fluid attenuation cyst seen in the upper pole right kidney. Atrophy of the lower pole left kidney is similar to comparison prior. No urolithiasis or hydronephrosis. Urinary bladder is largely decompressed at the time of exam and therefore poorly evaluated by CT imaging. Mild bladder wall thickening may be related to underdistention. Stomach/Bowel: Distal esophagus is unremarkable. Stomach mildly distended with air and ingested material. Contains gastrostomy noted along the anterior wall of the distal gastric body which appears appropriately apposed to the anterior abdominal wall with inflated pexy balloon in the gastric lumen. No extraluminal gas or free fluid in the vicinity to suggest erosion or malpositioning of the gastrostomy tube. There are multiple air and fluid distended loops of small bowel in the mid abdomen. Partial herniation of the anti mesenteric wall of the small bowel is noted in a right paraumbilical abdominal wall defect (3/69) abdominal wall defect measures approximately a 1.3 by 2.1 cm craniocaudal by transverse dimension. Bowel appears to transition to more decompressed distal small bowel loops at or beyond this focus. No colonic dilatation or wall thickening. Rectosigmoid anastomosis appears patent. Vascular/Lymphatic: Atherosclerotic calcifications within the abdominal aorta and branch vessels. No aneurysm or ectasia. No enlarged abdominopelvic lymph nodes. Reproductive: Normal  appearance of the uterus and adnexal structures. Scattered calcified pelvic phleboliths. Other: Right periumbilical bowel containing Richter's type hernia, as above. Small fat containing umbilical hernia noted as  well. No other bowel containing hernia. No abdominopelvic free air or fluid. Musculoskeletal: Multilevel degenerative changes are present in the imaged portions of the spine. IMPRESSION: 1. Features concerning for a small-bowel obstruction with possible small bowel transition point suspected in the vicinity of a Richter's type hernia in the right mid abdomen (3/69). 2. Percutaneous gastrostomy catheter along the greater curvature of the stomach. Stomach appears well apposed to the anterior abdominal wall without gross complication visible at this time. 3. Moderate to large colonic stool burden, correlate for features of constipation. 4. Chronic atrophy of the lower pole left kidney. 5. Chronic architectural distortion and scarring in the left lung base with associated volume loss. 6.  Aortic Atherosclerosis (ICD10-I70.0). Electronically Signed   By: Lovena Le M.D.   On: 03/09/2021 00:55   DG Chest Port 1 View  Result Date: 03/08/2021 CLINICAL DATA:  Vomiting and lower abdominal pain. EXAM: PORTABLE CHEST 1 VIEW COMPARISON:  Feb 09, 2021 FINDINGS: There is stable tracheostomy tube positioning. Low lung volumes are noted which may be secondary to the degree of patient inspiration. Mild atelectasis and/or infiltrate is seen within the retrocardiac region of the left lung base. The cardiac silhouette is borderline in size. There is mild calcification of the aortic arch. The visualized skeletal structures are unremarkable. IMPRESSION: Low lung volumes with mild left basilar atelectasis and/or infiltrate. Electronically Signed   By: Virgina Norfolk M.D.   On: 03/08/2021 22:32   DG Abd Portable 1V-Small Bowel Obstruction Protocol-initial, 8 hr delay  Result Date: 03/09/2021 CLINICAL DATA:  Small bowel obstruction EXAM: PORTABLE ABDOMEN - 1 VIEW COMPARISON:  CT abdomen pelvis, 03/08/2021 FINDINGS: Diffusely gas distended small bowel and colon, gas present to the rectum, generally similar to prior CT.  Percutaneous gastrostomy tube. No free air in the abdomen on supine radiographs. IMPRESSION: Diffusely gas distended small bowel and colon, gas present to the rectum, generally similar to prior CT and most in keeping with ileus. No free air in the abdomen on supine radiographs. Electronically Signed   By: Eddie Candle M.D.   On: 03/09/2021 10:41    Anti-infectives: Anti-infectives (From admission, onward)    None       Assessment/Plan: SBO/ incisional hernia - no sign of obstruction, hernia reducible.  May restart tube feeds and if she tolerated, may return to [facility].   No surgical indications  Will sign off for now.  Please call us back if needed.   LOS: 1 day    [name] 03/10/2021

## 2021-03-10 NOTE — Discharge Summary (Signed)
Physician Discharge Summary  Patient ID: Lindsey Collins MRN: [id number] DOB/AGE: [birth year] 75 y.o.  Admit date: 03/08/2021 Discharge date: 03/10/2021  Admission Diagnoses: SBO  Discharge Diagnoses:  Ileus  Discharged Condition: fair  Hospital Course:  Patient with chronic vent/PEG admitted for concern for SBO. Her PEG was placed on low intermittent suction but began having spontaneous bowel movements and felt much better. Surgery evaluated her and thinks this may just be ileus and constipation.  Would treat these more aggressively at [facility].  Consults: general surgery  Significant Diagnostic Studies:  CT: probable ileus rather than SBO  Treatments:  Bowel regimen, supportive care  Discharge Exam: Blood pressure 124/70, pulse 65, temperature 98.2 F (36.8 C), temperature source Oral, resp. rate 17, height _0  (1.626 m), weight 91 kg, SpO2 97 %. Constitutional: chronically ill appearing woman in NAD  Eyes: EOMI, pupils equal Ears, nose, mouth, and throat: trach in place, minimal secretions Cardiovascular: RRR, ext warm Respiratory: scattered rhonci, no wheezing, triggers vent Gastrointestinal: soft, reducible hernia, +BS, minimal TTP Skin: No rashes, normal turgor Neurologic: moves all 4 ext  Psychiatric: nodding appropriately to questions  Disposition: :LTACH   Allergies as of 03/10/2021   No Known Allergies      Medication List     TAKE these medications    acetaminophen 325 MG tablet Commonly known as: TYLENOL Place 650 mg into feeding tube every 6 (six) hours as needed for moderate pain.   allopurinol 100 MG tablet Commonly known as: ZYLOPRIM Place 200 mg into feeding tube daily.   amiodarone 200 MG tablet Commonly known as: PACERONE Place 200 mg into feeding tube daily.   atorvastatin 20 MG tablet Commonly known as: LIPITOR Place 20 mg into feeding tube at bedtime.   carbidopa-levodopa 25-100 MG tablet Commonly known as: SINEMET IR Place 1  tablet into feeding tube every 6 (six) hours. Times given : 00:00, 0600; 12:00, 1800   chlorhexidine 0.12 % solution Commonly known as: PERIDEX Use as directed 15 mLs in the mouth or throat 2 (two) times daily.   cholecalciferol 25 MCG (1000 UNIT) tablet Commonly known as: VITAMIN D3 Place 1,000 Units into feeding tube daily.   cloNIDine 0.2 MG tablet Commonly known as: CATAPRES Place 0.2 mg into feeding tube every 8 (eight) hours as needed (SBP >180).   divalproex 125 MG capsule Commonly known as: DEPAKOTE SPRINKLE 500 mg 2 (two) times daily. Via tube   FLUoxetine 10 MG tablet Commonly known as: PROZAC Place 10 mg into feeding tube daily.   furosemide 20 MG tablet Commonly known as: LASIX Place 20 mg into feeding tube every 12 (twelve) hours.   Glucagon Emergency 1 MG Kit Inject 1 mg as directed as needed (blood sugar less than 70).   guaiFENesin 200 MG tablet Place 400 mg into feeding tube every 8 (eight) hours.   insulin aspart 100 UNIT/ML injection Commonly known as: novoLOG Inject 0-10 Units into the skin every 6 (six) hours. Sliding scale: CBG 150 = 0 units, 151-200 = 2 units, 201-250 = 4 units, 251-300 = 6 units, 301-350 = 8 units, 351-400 = 10 units.   insulin glargine 100 UNIT/ML injection Commonly known as: LANTUS Inject 60 Units into the skin at bedtime.   ipratropium-albuterol 0.5-2.5 (3) MG/3ML Soln Commonly known as: DUONEB Take 3 mLs by nebulization every 6 (six) hours.   magnesium oxide 400 MG tablet Commonly known as: MAG-OX Place 400 mg into feeding tube 2 (two) times daily.   memantine 5  MG tablet Commonly known as: NAMENDA Place 5 mg into feeding tube 2 (two) times daily.   metoprolol tartrate 25 MG tablet Commonly known as: LOPRESSOR Place 25 mg into feeding tube 2 (two) times daily.   OLANZapine 10 MG tablet Commonly known as: ZYPREXA Place 10 mg into feeding tube at bedtime.   Ozempic (0.25 or 0.5 MG/DOSE) 2 MG/1.5ML Sopn Generic  drug: Semaglutide(0.25 or 0.5MG/DOS) Inject 0.25 mg into the skin once a week.   pantoprazole sodium 40 mg/20 mL Pack Commonly known as: PROTONIX Place 40 mg into feeding tube 2 (two) times daily.   polyethylene glycol 17 g packet Commonly known as: MIRALAX / GLYCOLAX Place 17 g into feeding tube daily.   Potassium Chloride 40 MEQ/15ML (20%) Soln Give 40 mEq by tube daily.   pramipexole 0.125 MG tablet Commonly known as: MIRAPEX Place 0.125 mg into feeding tube every 8 (eight) hours.   propylthiouracil 50 MG tablet Commonly known as: PTU Place 50 mg into feeding tube every 8 (eight) hours.   senna-docusate 8.6-50 MG tablet Commonly known as: Senokot-S Place 2 tablets into feeding tube at bedtime.         Signed: Candee Furbish 03/10/2021, 11:37 AM

## 2021-03-10 NOTE — NC FL2 (Signed)
[place] LEVEL OF CARE SCREENING TOOL     IDENTIFICATION  Patient Name: Lindsey Collins Birthdate: 11/29/1945 Sex: female Admission Date (Current Location): 03/08/2021  [facility] and [place] Number:  Herbalist and Address:  [facility] [place] [place] [address], [place], [place] [address]      Provider Number: [id number]  Attending Physician Name and Address:  Candee Furbish, MD  Relative Name and Phone Number:  MALONI, MUSLEH (Daughter)   [phone] [facility] Phone)    Current Level of Care: Hospital Recommended Level of Care: [place] Prior Approval Number:    Date Approved/Denied:   PASRR Number: [id number]  Discharge Plan: SNF    Current Diagnoses: Patient Active Problem List   Diagnosis Date Noted   SBO (small bowel obstruction) ([place]) 03/09/2021   ARDS (adult respiratory distress syndrome) ([place]) 01/14/2021   Community acquired pneumonia of right lung    Chronic respiratory failure with hypoxia ([place]) 12/20/2020   Status post tracheostomy ([place]) 12/20/2020   Normocytic anemia 12/20/2020   Parkinson's disease ([place]) 12/20/2020   Diabetes mellitus type 2, uncontrolled ([place]) 12/20/2020   Acute on chronic respiratory failure with hypoxia and hypercapnia (HCC)    MRSA (methicillin resistant staphylococcus aureus) pneumonia (HCC)    MRSA (methicillin resistant staphylococcus aureus) pneumonia (HCC)    Obstructive chronic bronchitis without exacerbation (HCC)    Chronic kidney disease, stage II (mild)     Orientation RESPIRATION BLADDER Height & Weight     Self  Vent (40%) Incontinent, External catheter Weight: 200 lb 9.9 oz (91 kg) Height:  _0  (162.6 cm)  BEHAVIORAL SYMPTOMS/MOOD NEUROLOGICAL BOWEL NUTRITION STATUS      Incontinent    AMBULATORY STATUS COMMUNICATION OF NEEDS Skin   Extensive Assist Verbally Other (Comment) Systems developer)                       Personal Care Assistance Level of Assistance   Bathing, Feeding, Dressing Bathing Assistance: Maximum assistance Feeding assistance: Independent Dressing Assistance: Maximum assistance     Functional Limitations Info  Sight, Hearing, Speech Sight Info: Adequate Hearing Info: Adequate Speech Info: Adequate    SPECIAL CARE FACTORS FREQUENCY  PT (By licensed PT), OT (By licensed OT)     PT Frequency: 5x/week OT Frequency: 5x/week            Contractures Contractures Info: Not present    Additional Factors Info  Code Status, Allergies, Isolation Precautions Code Status Info: Full code Allergies Info: No Known allergies     Isolation Precautions Info: MRSA     Current Medications (03/10/2021):  This is the current hospital active medication list Current Facility-Administered Medications  Medication Dose Route Frequency Provider Last Rate Last Admin   acetaminophen (TYLENOL) tablet 650 mg  650 mg Per Tube Q6H PRN Candee Furbish, MD       allopurinol (ZYLOPRIM) tablet 200 mg  200 mg Per Tube Daily Candee Furbish, MD   200 mg at 03/10/21 0109   amiodarone (PACERONE) tablet 200 mg  200 mg Per Tube Daily Candee Furbish, MD   200 mg at 03/10/21 3235   atorvastatin (LIPITOR) tablet 20 mg  20 mg Per Tube QHS Candee Furbish, MD       carbidopa-levodopa (SINEMET IR) 25-100 MG per tablet immediate release 1 tablet  1 tablet Per Tube Q6H Candee Furbish, MD   1 tablet at 03/10/21 1125   chlorhexidine  gluconate (MEDLINE KIT) (PERIDEX) 0.12 % solution 15 mL  15 mL Mouth Rinse BID Collier Bullock, MD   15 mL at 03/10/21 0829   Chlorhexidine Gluconate Cloth 2 % PADS 6 each  6 each Topical Q0600 Collier Bullock, MD   6 each at 03/10/21 1125   diatrizoate meglumine-sodium (GASTROGRAFIN) 66-10 % solution 90 mL  90 mL Per NG tube Once Georganna Skeans, MD       enoxaparin (LOVENOX) injection 40 mg  40 mg Subcutaneous Q24H Candee Furbish, MD   40 mg at 03/10/21 0953   [START ON 03/11/2021] feeding supplement (VITAL HIGH PROTEIN) liquid  1,000 mL  1,000 mL Per Tube Q24H Candee Furbish, MD       FLUoxetine (PROZAC) 20 MG/5ML solution 10 mg  10 mg Per Tube Daily Candee Furbish, MD   10 mg at 03/10/21 0953   insulin aspart (novoLOG) injection 0-24 Units  0-24 Units Subcutaneous Q4H Collier Bullock, MD   4 Units at 03/10/21 1124   ipratropium-albuterol (DUONEB) 0.5-2.5 (3) MG/3ML nebulizer solution 3 mL  3 mL Nebulization Q6H Simonne Maffucci B, MD   3 mL at 03/10/21 0757   magnesium oxide (MAG-OX) tablet 400 mg  400 mg Per Tube BID Candee Furbish, MD   400 mg at 03/10/21 1025   MEDLINE mouth rinse  15 mL Mouth Rinse 10 times per day Collier Bullock, MD   15 mL at 03/10/21 1125   memantine (NAMENDA) tablet 5 mg  5 mg Per Tube BID Candee Furbish, MD   5 mg at 03/10/21 0954   metoprolol tartrate (LOPRESSOR) tablet 25 mg  25 mg Per Tube BID Candee Furbish, MD   25 mg at 03/10/21 8527   mupirocin ointment (BACTROBAN) 2 %   Nasal BID Simonne Maffucci B, MD   1 application at 78/24/23 0952   OLANZapine (ZYPREXA) tablet 10 mg  10 mg Per Tube QHS Candee Furbish, MD       pantoprazole sodium (PROTONIX) 40 mg/20 mL oral suspension 40 mg  40 mg Per Tube BID Candee Furbish, MD   40 mg at 03/10/21 5361   pramipexole (MIRAPEX) tablet 0.125 mg  0.125 mg Per Tube Q8H Candee Furbish, MD   0.125 mg at 03/10/21 0820   propylthiouracil (PTU) tablet 50 mg  50 mg Per Tube Q8H Candee Furbish, MD   50 mg at 03/10/21 0818   valproic acid (DEPAKENE) 250 MG/5ML solution 250 mg  250 mg Per Tube QID Candee Furbish, MD   250 mg at 03/10/21 1125     Discharge Medications: Please see discharge summary for a list of discharge medications.  Relevant Imaging Results:  Relevant Lab Results:   Additional Information SSN Waldo Boykin, [place]

## 2021-03-11 LAB — GLUCOSE, CAPILLARY: Glucose-Capillary: 148 mg/dL — ABNORMAL HIGH (ref 70–99)

## 2021-03-14 LAB — CULTURE, BLOOD (ROUTINE X 2): Culture: NO GROWTH

## 2021-03-22 ENCOUNTER — Emergency Department (HOSPITAL_COMMUNITY): Payer: Medicare Other

## 2021-03-22 ENCOUNTER — Inpatient Hospital Stay (HOSPITAL_COMMUNITY)
Admission: EM | Admit: 2021-03-22 | Discharge: 2021-03-26 | DRG: 388 | Disposition: A | Discharge status: Skilled Nursing Facility | Payer: Medicare Other | Attending: Internal Medicine | Admitting: Internal Medicine

## 2021-03-22 ENCOUNTER — Observation Stay (HOSPITAL_COMMUNITY): Payer: Medicare Other

## 2021-03-22 ENCOUNTER — Encounter (HOSPITAL_COMMUNITY): Payer: Self-pay | Admitting: Emergency Medicine

## 2021-03-22 DIAGNOSIS — Z20822 Contact with and (suspected) exposure to covid-19: Secondary | ICD-10-CM | POA: Diagnosis not present

## 2021-03-22 DIAGNOSIS — Z930 Tracheostomy status: Secondary | ICD-10-CM

## 2021-03-22 DIAGNOSIS — E1165 Type 2 diabetes mellitus with hyperglycemia: Secondary | ICD-10-CM | POA: Diagnosis not present

## 2021-03-22 DIAGNOSIS — K56609 Unspecified intestinal obstruction, unspecified as to partial versus complete obstruction: Secondary | ICD-10-CM | POA: Diagnosis present

## 2021-03-22 DIAGNOSIS — D649 Anemia, unspecified: Secondary | ICD-10-CM | POA: Diagnosis not present

## 2021-03-22 DIAGNOSIS — E1122 Type 2 diabetes mellitus with diabetic chronic kidney disease: Secondary | ICD-10-CM | POA: Diagnosis not present

## 2021-03-22 DIAGNOSIS — G20 Parkinson's disease: Secondary | ICD-10-CM | POA: Diagnosis present

## 2021-03-22 DIAGNOSIS — K432 Incisional hernia without obstruction or gangrene: Secondary | ICD-10-CM | POA: Diagnosis not present

## 2021-03-22 DIAGNOSIS — N182 Chronic kidney disease, stage 2 (mild): Secondary | ICD-10-CM | POA: Diagnosis present

## 2021-03-22 DIAGNOSIS — L89151 Pressure ulcer of sacral region, stage 1: Secondary | ICD-10-CM | POA: Diagnosis not present

## 2021-03-22 DIAGNOSIS — Z79899 Other long term (current) drug therapy: Secondary | ICD-10-CM | POA: Diagnosis not present

## 2021-03-22 DIAGNOSIS — G20A1 Parkinson's disease without dyskinesia, without mention of fluctuations: Secondary | ICD-10-CM | POA: Diagnosis present

## 2021-03-22 DIAGNOSIS — I480 Paroxysmal atrial fibrillation: Secondary | ICD-10-CM | POA: Diagnosis not present

## 2021-03-22 DIAGNOSIS — Z6834 Body mass index (BMI) 34.0-34.9, adult: Secondary | ICD-10-CM

## 2021-03-22 DIAGNOSIS — L8990 Pressure ulcer of unspecified site, unspecified stage: Secondary | ICD-10-CM | POA: Insufficient documentation

## 2021-03-22 DIAGNOSIS — Z1623 Resistance to quinolones and fluoroquinolones: Secondary | ICD-10-CM | POA: Diagnosis present

## 2021-03-22 DIAGNOSIS — J449 Chronic obstructive pulmonary disease, unspecified: Secondary | ICD-10-CM | POA: Diagnosis present

## 2021-03-22 DIAGNOSIS — N179 Acute kidney failure, unspecified: Secondary | ICD-10-CM | POA: Diagnosis present

## 2021-03-22 DIAGNOSIS — E876 Hypokalemia: Secondary | ICD-10-CM | POA: Diagnosis not present

## 2021-03-22 DIAGNOSIS — Z9911 Dependence on respirator [ventilator] status: Secondary | ICD-10-CM | POA: Diagnosis not present

## 2021-03-22 DIAGNOSIS — Z931 Gastrostomy status: Secondary | ICD-10-CM | POA: Diagnosis not present

## 2021-03-22 DIAGNOSIS — J9690 Respiratory failure, unspecified, unspecified whether with hypoxia or hypercapnia: Secondary | ICD-10-CM

## 2021-03-22 DIAGNOSIS — Z9049 Acquired absence of other specified parts of digestive tract: Secondary | ICD-10-CM

## 2021-03-22 DIAGNOSIS — J690 Pneumonitis due to inhalation of food and vomit: Secondary | ICD-10-CM | POA: Diagnosis present

## 2021-03-22 DIAGNOSIS — I5032 Chronic diastolic (congestive) heart failure: Secondary | ICD-10-CM | POA: Diagnosis not present

## 2021-03-22 DIAGNOSIS — K56600 Partial intestinal obstruction, unspecified as to cause: Principal | ICD-10-CM | POA: Diagnosis present

## 2021-03-22 DIAGNOSIS — J9611 Chronic respiratory failure with hypoxia: Secondary | ICD-10-CM | POA: Diagnosis not present

## 2021-03-22 DIAGNOSIS — Z933 Colostomy status: Secondary | ICD-10-CM

## 2021-03-22 DIAGNOSIS — Z8719 Personal history of other diseases of the digestive system: Secondary | ICD-10-CM

## 2021-03-22 DIAGNOSIS — Z794 Long term (current) use of insulin: Secondary | ICD-10-CM

## 2021-03-22 DIAGNOSIS — E860 Dehydration: Secondary | ICD-10-CM | POA: Diagnosis not present

## 2021-03-22 DIAGNOSIS — J9621 Acute and chronic respiratory failure with hypoxia: Secondary | ICD-10-CM | POA: Diagnosis present

## 2021-03-22 DIAGNOSIS — Z0189 Encounter for other specified special examinations: Secondary | ICD-10-CM

## 2021-03-22 DIAGNOSIS — IMO0002 Reserved for concepts with insufficient information to code with codable children: Secondary | ICD-10-CM | POA: Diagnosis present

## 2021-03-22 LAB — COMPREHENSIVE METABOLIC PANEL
ALT: 12 U/L (ref 0–44)
AST: 19 U/L (ref 15–41)
Albumin: 3.6 g/dL (ref 3.5–5.0)
Alkaline Phosphatase: 56 U/L (ref 38–126)
Anion gap: 14 (ref 5–15)
BUN: 37 mg/dL — ABNORMAL HIGH (ref 8–23)
CO2: 28 mmol/L (ref 22–32)
Calcium: 10 mg/dL (ref 8.9–10.3)
Chloride: 97 mmol/L — ABNORMAL LOW (ref 98–111)
Creatinine, Ser: 1.27 mg/dL — ABNORMAL HIGH (ref 0.44–1.00)
GFR, Estimated: 44 mL/min — ABNORMAL LOW (ref >60)
Glucose, Bld: 332 mg/dL — ABNORMAL HIGH (ref 70–99)
Potassium: 4.5 mmol/L (ref 3.5–5.1)
Sodium: 139 mmol/L (ref 135–145)
Total Bilirubin: 0.7 mg/dL (ref 0.3–1.2)
Total Protein: 8.3 g/dL — ABNORMAL HIGH (ref 6.5–8.1)

## 2021-03-22 LAB — CBC
HCT: 41 % (ref 36.0–46.0)
Hemoglobin: 11.7 g/dL — ABNORMAL LOW (ref 12.0–15.0)
MCH: 26.7 pg (ref 26.0–34.0)
MCHC: 28.5 g/dL — ABNORMAL LOW (ref 30.0–36.0)
MCV: 93.6 fL (ref 80.0–100.0)
Platelets: 406 10*3/uL — ABNORMAL HIGH (ref 150–400)
RBC: 4.38 MIL/uL (ref 3.87–5.11)
RDW: 16.3 % — ABNORMAL HIGH (ref 11.5–15.5)
WBC: 11.5 10*3/uL — ABNORMAL HIGH (ref 4.0–10.5)
nRBC: 0 % (ref 0.0–0.2)

## 2021-03-22 LAB — RESP PANEL BY RT-PCR (FLU A&B, COVID) ARPGX2
Influenza A by PCR: NEGATIVE
Influenza B by PCR: NEGATIVE
SARS Coronavirus 2 by RT PCR: NEGATIVE

## 2021-03-22 LAB — CBG MONITORING, ED
Glucose-Capillary: 218 mg/dL — ABNORMAL HIGH (ref 70–99)
Glucose-Capillary: 255 mg/dL — ABNORMAL HIGH (ref 70–99)
Glucose-Capillary: 267 mg/dL — ABNORMAL HIGH (ref 70–99)

## 2021-03-22 LAB — LIPASE, BLOOD: Lipase: 24 U/L (ref 11–51)

## 2021-03-22 MED ORDER — CHLORHEXIDINE GLUCONATE 0.12 % MT SOLN
15.0000 mL | Freq: Two times a day (BID) | OROMUCOSAL | Status: DC
  Administered 2021-03-22 – 2021-03-25 (×5): 15 mL via OROMUCOSAL
  Filled 2021-03-22 (×6): qty 15

## 2021-03-22 MED ORDER — ENOXAPARIN SODIUM 40 MG/0.4ML IJ SOSY
40.0000 mg | PREFILLED_SYRINGE | INTRAMUSCULAR | Status: DC
  Administered 2021-03-22 – 2021-03-25 (×4): 40 mg via SUBCUTANEOUS
  Filled 2021-03-22 (×4): qty 0.4

## 2021-03-22 MED ORDER — INSULIN GLARGINE 100 UNIT/ML ~~LOC~~ SOLN
60.0000 [IU] | Freq: Every day | SUBCUTANEOUS | Status: DC
  Administered 2021-03-22: 60 [IU] via SUBCUTANEOUS
  Filled 2021-03-22 (×2): qty 0.6

## 2021-03-22 MED ORDER — LACTATED RINGERS IV SOLN: INTRAVENOUS | Status: DC

## 2021-03-22 MED ORDER — PIPERACILLIN-TAZOBACTAM 3.375 G IVPB
3.3750 g | Freq: Three times a day (TID) | INTRAVENOUS | Status: DC
  Administered 2021-03-22 – 2021-03-25 (×8): 3.375 g via INTRAVENOUS
  Filled 2021-03-22 (×8): qty 50

## 2021-03-22 MED ORDER — ACETAMINOPHEN 650 MG RE SUPP: 650.0000 mg | Freq: Four times a day (QID) | RECTAL | Status: DC | PRN

## 2021-03-22 MED ORDER — INSULIN ASPART 100 UNIT/ML IJ SOLN
0.0000 [IU] | INTRAMUSCULAR | Status: DC
  Administered 2021-03-22: 8 [IU] via SUBCUTANEOUS
  Administered 2021-03-23 (×2): 3 [IU] via SUBCUTANEOUS
  Administered 2021-03-23 – 2021-03-24 (×2): 2 [IU] via SUBCUTANEOUS
  Administered 2021-03-24: 3 [IU] via SUBCUTANEOUS
  Administered 2021-03-24 (×4): 2 [IU] via SUBCUTANEOUS
  Administered 2021-03-25 (×6): 3 [IU] via SUBCUTANEOUS
  Administered 2021-03-26 (×3): 5 [IU] via SUBCUTANEOUS
  Administered 2021-03-26: 3 [IU] via SUBCUTANEOUS

## 2021-03-22 MED ORDER — SEMAGLUTIDE(0.25 OR 0.5MG/DOS) 2 MG/1.5ML ~~LOC~~ SOPN: 0.2500 mg | PEN_INJECTOR | SUBCUTANEOUS | Status: DC

## 2021-03-22 MED ORDER — ONDANSETRON HCL 4 MG/2ML IJ SOLN: 4.0000 mg | Freq: Four times a day (QID) | INTRAMUSCULAR | Status: DC | PRN

## 2021-03-22 MED ORDER — ACETAMINOPHEN 325 MG PO TABS: 650.0000 mg | ORAL_TABLET | Freq: Four times a day (QID) | ORAL | Status: DC | PRN

## 2021-03-22 MED ORDER — INSULIN ASPART 100 UNIT/ML IJ SOLN
10.0000 [IU] | Freq: Once | INTRAMUSCULAR | Status: AC
  Administered 2021-03-22: 10 [IU] via INTRAVENOUS

## 2021-03-22 MED ORDER — DIATRIZOATE MEGLUMINE & SODIUM 66-10 % PO SOLN
90.0000 mL | Freq: Once | ORAL | Status: AC
Start: 2021-03-22 — End: 2021-03-22
  Administered 2021-03-22: 90 mL via NASOGASTRIC
  Filled 2021-03-22: qty 90

## 2021-03-22 MED ORDER — METOPROLOL TARTRATE 5 MG/5ML IV SOLN
2.5000 mg | Freq: Four times a day (QID) | INTRAVENOUS | Status: DC
  Administered 2021-03-23 (×3): 2.5 mg via INTRAVENOUS
  Filled 2021-03-22 (×3): qty 5

## 2021-03-22 MED ORDER — METOPROLOL TARTRATE 5 MG/5ML IV SOLN: 10.0000 mg | Freq: Two times a day (BID) | INTRAVENOUS | Status: DC

## 2021-03-22 NOTE — ED Triage Notes (Signed)
Pt arrives via [facility] from [facility] with reports of abd pain/distention and emesis. Pt had xray done at facility this morning showing bowel obstruction. Reports fecal matter was in trach.

## 2021-03-22 NOTE — Progress Notes (Signed)
Reason for Consult/Chief Complaint: SBO Consultant: Alvino Chapel, MD  Lindsey Collins is an 75 y.o. female.   HPI: 87F who is chronically vent-dependent, residing at [facility], who presented with a 2 day h/o n/v. She reports her last BM was 7/8 and states it was normal in color, quality, and consistency. She reports most of her nutrition is via her g-tube, but that she also does take some nutrition by mouth. She denies any prior SBOs and does have a history of abdominal surgery. On exam she has bilious vomitus present on her gown. Obtaining a history is challenging due to patient being mechanically ventilated and trying to mouth lengthy sentences.   Past Medical History:  Diagnosis Date   Acute on chronic respiratory failure with hypoxia (HCC)    ARDS (adult respiratory distress syndrome) (HCC)    Chronic kidney disease, stage II (mild)    MRSA (methicillin resistant staphylococcus aureus) pneumonia (HCC)    Obstructive chronic bronchitis without exacerbation (HCC)     Past Surgical History:  Procedure Laterality Date   TRACHEOSTOMY      Family History  Problem Relation Age of Onset   CAD Mother        hx CABG    Social History:  reports that she has never smoked. She has never used smokeless tobacco. No history on file for alcohol use and drug use.  Allergies: No Known Allergies  Medications: I have reviewed the patient's current medications.  Results for orders placed or performed during the hospital encounter of 03/22/21 (from the past 48 hour(s))  Lipase, blood     Status: None   Collection Time: 03/22/21 12:55 PM  Result Value Ref Range   Lipase 24 11 - 51 U/L    Comment: Performed at [facility], [place] [address], [place], [place] [address]  Comprehensive metabolic panel     Status: Abnormal   Collection Time: 03/22/21 12:55 PM  Result Value Ref Range   Sodium 139 135 - 145 mmol/L   Potassium 4.5 3.5 - 5.1 mmol/L   Chloride 97 (L) 98 - 111 mmol/L   CO2 28 22 -  32 mmol/L   Glucose, Bld 332 (H) 70 - 99 mg/dL    Comment: Glucose reference range applies only to samples taken after fasting for at least 8 hours.   BUN 37 (H) 8 - 23 mg/dL   Creatinine, Ser 1.27 (H) 0.44 - 1.00 mg/dL   Calcium 10.0 8.9 - 10.3 mg/dL   Total Protein 8.3 (H) 6.5 - 8.1 g/dL   Albumin 3.6 3.5 - 5.0 g/dL   AST 19 15 - 41 U/L   ALT 12 0 - 44 U/L   Alkaline Phosphatase 56 38 - 126 U/L   Total Bilirubin 0.7 0.3 - 1.2 mg/dL   GFR, Estimated 44 (L) >60 mL/min    Comment: (NOTE) Calculated using the CKD-EPI Creatinine Equation (2021)    Anion gap 14 5 - 15    Comment: Performed at [facility] [address], [place], [place] [address]  CBC     Status: Abnormal   Collection Time: 03/22/21 12:55 PM  Result Value Ref Range   WBC 11.5 (H) 4.0 - 10.5 K/uL   RBC 4.38 3.87 - 5.11 MIL/uL   Hemoglobin 11.7 (L) 12.0 - 15.0 g/dL   HCT 41.0 36.0 - 46.0 %   MCV 93.6 80.0 - 100.0 fL   MCH 26.7 26.0 - 34.0 pg   MCHC 28.5 (  L) 30.0 - 36.0 g/dL   RDW 16.3 (H) 11.5 - 15.5 %   Platelets 406 (H) 150 - 400 K/uL   nRBC 0.0 0.0 - 0.2 %    Comment: Performed at [facility], [place] [address], [place], [place] [address]  CBG monitoring, ED     Status: Abnormal   Collection Time: 03/22/21  3:35 PM  Result Value Ref Range   Glucose-Capillary 255 (H) 70 - 99 mg/dL    Comment: Glucose reference range applies only to samples taken after fasting for at least 8 hours.  Resp Panel by RT-PCR (Flu A&B, Covid) Nasopharyngeal Swab     Status: None   Collection Time: 03/22/21  4:33 PM   Specimen: Nasopharyngeal Swab; Nasopharyngeal(NP) swabs in vial transport medium  Result Value Ref Range   SARS Coronavirus 2 by RT PCR NEGATIVE NEGATIVE    Comment: (NOTE) SARS-CoV-2 target nucleic acids are NOT DETECTED.  The SARS-CoV-2 RNA is generally detectable in upper respiratory specimens during the acute phase of infection. The lowest concentration of SARS-CoV-2 viral copies this assay can detect  is 138 copies/mL. A negative result does not preclude SARS-Cov-2 infection and should not be used as the sole basis for treatment or other patient management decisions. A negative result may occur with  improper specimen collection/handling, submission of specimen other than nasopharyngeal swab, presence of viral mutation(s) within the areas targeted by this assay, and inadequate number of viral copies(<138 copies/mL). A negative result must be combined with clinical observations, patient history, and epidemiological information. The expected result is Negative.  Fact Sheet for Patients:  EntrepreneurPulse.com.au  Fact Sheet for Healthcare Providers:  IncredibleEmployment.be  This test is no t yet approved or cleared by the [place] FDA and  has been authorized for detection and/or diagnosis of SARS-CoV-2 by FDA under an Emergency Use Authorization (EUA). This EUA will remain  in effect (meaning this test can be used) for the duration of the COVID-19 declaration under Section 564(b)(1) of the Act, 21 U.S.C.section 360bbb-3(b)(1), unless the authorization is terminated  or revoked sooner.       Influenza A by PCR NEGATIVE NEGATIVE   Influenza B by PCR NEGATIVE NEGATIVE    Comment: (NOTE) The Xpert Xpress SARS-CoV-2/FLU/RSV plus assay is intended as an aid in the diagnosis of influenza from Nasopharyngeal swab specimens and should not be used as a sole basis for treatment. Nasal washings and aspirates are unacceptable for Xpert Xpress SARS-CoV-2/FLU/RSV testing.  Fact Sheet for Patients: EntrepreneurPulse.com.au  Fact Sheet for Healthcare Providers: IncredibleEmployment.be  This test is not yet approved or cleared by the [place] FDA and has been authorized for detection and/or diagnosis of SARS-CoV-2 by FDA under an Emergency Use Authorization (EUA). This EUA will remain in effect (meaning this  test can be used) for the duration of the COVID-19 declaration under Section 564(b)(1) of the Act, 21 U.S.C. section 360bbb-3(b)(1), unless the authorization is terminated or revoked.  Performed at [facility], [place] [address] [place], [place] [address]   CBG monitoring, ED     Status: Abnormal   Collection Time: 03/22/21  6:50 PM  Result Value Ref Range   Glucose-Capillary 218 (H) 70 - 99 mg/dL    Comment: Glucose reference range applies only to samples taken after fasting for at least 8 hours.    CT Abdomen Pelvis Wo Contrast  Result Date: 03/22/2021 CLINICAL DATA:  Bowel obstruction suspected EXAM: CT ABDOMEN AND PELVIS WITHOUT CONTRAST TECHNIQUE: Multidetector  CT imaging of the abdomen and pelvis was performed following the standard protocol without IV contrast. COMPARISON:  03/08/2021 FINDINGS: Lower chest: Airspace opacity with air bronchograms again noted in the left lower lobe, stable since prior study. No acute findings Hepatobiliary: No focal hepatic abnormality. Gallbladder unremarkable. Pancreas: No focal abnormality or ductal dilatation. Spleen: No focal abnormality.  Normal size. Adrenals/Urinary Tract: No adrenal abnormality. No focal renal abnormality. No stones or hydronephrosis. Urinary bladder is unremarkable. Stomach/Bowel: Dilated small bowel loops in the abdomen and pelvis. Distal small bowel loops are decompressed. Findings compatible with distal small bowel obstruction. Exact transition not visualized. Postoperative changes in the sigmoid colon. Gastrostomy tube within the stomach. Vascular/Lymphatic: Aortic atherosclerosis. No evidence of aneurysm or adenopathy. Reproductive: Uterus and adnexa unremarkable.  No mass. Other: No free fluid or free air. Musculoskeletal: No acute bony abnormality. IMPRESSION: Dilated fluid-filled small bowel loops with air-fluid levels into the pelvis. Distal small bowel is decompressed. Findings compatible with distal small bowel  obstruction. Aortic atherosclerosis. Chronic opacity in the left lower lobe, likely scarring. Electronically Signed   By: Rolm Baptise M.D.   On: 03/22/2021 16:39    ROS 10 point review of systems is negative except as listed above in HPI.   Physical Exam Blood pressure 124/79, pulse (!) 105, temperature 99.3 F (37.4 C), temperature source Axillary, resp. rate (!) 28, height 5\' 4"  (1.626 m), weight 91 kg, SpO2 99 %. Constitutional: well-developed, well-nourished HEENT: pupils equal, round, reactive to light, 64mm b/l, moist conjunctiva, external inspection of ears and nose normal, hearing intact Oropharynx: normal oropharyngeal mucosa, poor dentition Neck: no thyromegaly, trachea midline, no midline cervical tenderness to palpation Chest: breath sounds equal bilaterally, normal respiratory effort, mechanically ventilated, no midline or lateral chest wall tenderness to palpation/deformity Abdomen: soft, distended, tympanitic, g-tube clamped with bilious output in tubing, no bruising, no hepatosplenomegaly, midline scar GU: normal female genitalia  Back: no wounds, no thoracic/lumbar spine tenderness to palpation, no thoracic/lumbar spine stepoffs Rectal: deferred Extremities: 2+ radial and pedal pulses bilaterally, motor and sensation intact to bilateral UE and LE, no peripheral edema MSK: unable to assess gait/station, no clubbing/cyanosis of fingers/toes, normal ROM of all four extremities Skin: warm, dry, no rashes Psych: normal memory, normal mood/affect    Assessment/Plan: 64F with likely partial SBO. Recommend placing g-tube to LIS x30-67min, then administer gastrografin via SB protocol. Will follow abdominal exam and imaging.   Jesusita Oka, MD General and [facility]

## 2021-03-22 NOTE — Progress Notes (Signed)
Assist with transport to CT and back to room. No noted respiratory issues at this time.

## 2021-03-22 NOTE — Progress Notes (Signed)
   03/22/21 1241  Vent Select  $ Ventilator Initial/Subsequent  Initial  $ Ventilator Set Up with TRMT Adapter  Yes  Invasive or Noninvasive Invasive  Adult Vent Y  Tracheostomy Shiley XLT Proximal 6 mm Cuffed  No placement date or time found.   Inserted prior to hospital arrival?: Placed at another facility  Placed By: (c) Other (Comment)  Brand: Shiley XLT Proximal  Size (mm): 6 mm  Style: Cuffed  Status Secured  Site Assessment Clean  Ties Assessment Secure  Cuff Pressure (cm H2O) Clear OR 27-39 CmH2O  Adult Ventilator Settings  Vent Type Servo i  Humidity HME  Vent Mode PRVC  Vt Set 500 mL  Set Rate 26 bmp  FiO2 (%) 28 %  I Time 0.8 Sec(s)  PEEP 5 cmH20  Adult Ventilator Measurements  Peak Airway Pressure 34 L/min  Mean Airway Pressure 14 cmH20  Resp Rate Spontaneous 0 br/min  Resp Rate Total 26 br/min  Exhaled Vt 505 mL  Measured Ve 12 mL  I:E Ratio Measured 1:1.4  Auto PEEP 0 cmH20  Total PEEP 5 cmH20  SpO2 99 %  Adult Ventilator Alarms  Alarms On Y  Ve High Alarm 25 L/min  Ve Low Alarm 3 L/min  Resp Rate High Alarm 35 br/min  Resp Rate Low Alarm 8  PEEP Low Alarm 3 cmH2O  Press High Alarm 50 cmH2O  Breath Sounds  Bilateral Breath Sounds Diminished    Pt received from [facility]. Placed pt on vent settings per care link reporting from staff at [facility], also states pt has chonic high peak pressures on vent. No noted respiratory issues at this time

## 2021-03-22 NOTE — Progress Notes (Signed)
Pharmacy Antibiotic Note  Lindsey Collins is a 75 y.o. female admitted on 03/22/2021 presenting from [facility], chronic trach, inc abdominal pain, concern for aspiration.  Pharmacy has been consulted for zosyn dosing.  Plan: Zosyn 3.375g IV q 8h (extended infusion) Monitor renal function, clinical progression and LOT  Height: 5\' 4"  (162.6 cm) Weight: 91 kg (200 lb 9.9 oz) IBW/kg (Calculated) : 54.7  Temp (24hrs), Avg:99.3 F (37.4 C), Min:99.3 F (37.4 C), Max:99.3 F (37.4 C)  Recent Labs  Lab 03/22/21 1255  WBC 11.5*  CREATININE 1.27*    Estimated Creatinine Clearance: 42.5 mL/min (A) (by C-G formula based on SCr of 1.27 mg/dL (H)).    No Known Allergies  Bertis Ruddy, PharmD Clinical Pharmacist ED Pharmacist Phone # [phone] 03/22/2021 7:07 PM

## 2021-03-22 NOTE — ED Provider Notes (Addendum)
  Physical Exam  BP 125/82   Pulse (!) 108   Temp 99.3 F (37.4 C) (Axillary)   Resp (!) 26   Ht 5\' 4"  (1.626 m)   Wt 91 kg   SpO2 99%   BMI 34.44 kg/m   Physical Exam  ED Course/Procedures     Procedures  MDM  Received patient signout.  From [facility] for chronically trached and pegged.  Increasing abdominal pain and distention.  X-ray reportedly showed obstruction.  CT scan shows obstruction.  History of same.  Will admit to intensivist since patient is chronically trached and vented.  Have discussed with general surgery, Dr. Grandville Silos.  Discussed with Whitney from critical care.  She discussed with Dr. Halford Chessman the intensivist.  Rozell Searing that the patient should be a medicine admission and they will manage ventilator settings.  Will discuss with hospitalist       Davonna Belling, MD 03/22/21 [place] Mulligan    Davonna Belling, MD 03/22/21 [phone]

## 2021-03-22 NOTE — Consult Note (Signed)
NAME:  Lindsey Collins, MRN:  [id number], DOB:  02/03/1946, LOS: 0 ADMISSION DATE:  03/22/2021, CONSULTATION DATE:  03/22/2021 REFERRING MD:  Dr. Alvino Chapel, CHIEF COMPLAINT:  SBO with chronic Vent dependent though trach   History of Present Illness:  Lindsey Collins is a 75 y.o. female from [place] with a PMH significant for acute on chronic respiratory failure with hypoxia now vent dependent through trach, CKD stage II, Parkinson's, and diabetes who presented from [facility] with reports of abdominal pain with distention and emesis. No longer vomiting, but still having abdominal pain.  Her breathing feels at baseline. Of note patient was recently treated at this facility 6/26 - 6/27 for similar presentation and treated with medical management with improvement and discharge back to [facility]. Admission in May 2022 for ARDS due to Serratia pneumonia.  On ED arrival patient was seen with temp of 99.3 and mild tachypnea with respiratory rate of 26 on vent through trach. Labwork significant for glucose 332, creatinine 1.27, WBC 11.5, HGB 11.7. CT ABD/pelvis was obtained and consistent with distal small bowel obstruction, left lower lobe opacity also seen. PCCM consulted to help manage chronic vent.  Pertinent  Medical History  Acute on chronic respiratory failure with hypoxia now vent dependent through trach, CKD stage II, Parkinson's, and diabetes  Significant Hospital Events:  7/9 admitted for recurrent small bowel obstruction   Interim History / Subjective:  As above   Objective   Blood pressure 125/82, pulse (!) 108, temperature 99.3 F (37.4 C), temperature source Axillary, resp. rate (!) 26, height '5\' 4"'  (1.626 m), weight 91 kg, SpO2 99 %.    Vent Mode: PRVC FiO2 (%):  [28 %] 28 % Set Rate:  [26 bmp] 26 bmp Vt Set:  [500 mL] 500 mL PEEP:  [5 cmH20] 5 cmH20 Plateau Pressure:  [28 cmH20] 28 cmH20  No intake or output data in the 24 hours ending 03/22/21 1835 Filed Weights   03/22/21 1235   Weight: 91 kg    Examination: General: chronically ill appearing woman sitting up in bed in NAD HEENT: [place]/AT, eyes anicteric Neck: cuffed shiley #6 in place, no bleeding Cardiac: tachycardic, reg rhythm, no murmurs Resp: rhonchi on the right with yellow purulent secretions. Peak pressures 45> 30s with suctioning. Mild tachypnea. Abd: distended, TTP Extremities: mild pitting edema, no cyanosis or clubbing Derm: warm, pallor  CT reviewed: LLL opacity with air bronchograms, multiple bowel loops with air fluid levels.  Resolved Hospital Problem list     Assessment & Plan:  Acute SBO-POA -medical management with NGT needed -NPO -appreciate surgery's management  Acute on chronic respiratory failure with hypoxia,  POA Vent dependent with chronic tracheostomy, POA -6 cuffed XLT Shiley trach  P: -Continue ventilator support with lung protective strategies; 4-8cc/kg IBW  -Routine trach care. Still has Shiley in place since May. -VAP prevention protocol -sedation not needed on vent -No plans for vent wean given chronic baseline respiratory failure -titrate FiO2 as needed to maintain SpO2 >88%; currently doing well on 28%.  Likely LLL aspiration pneumonia-POA -trach aspirate & blood cultures -empiric pip-tazo  AKI, likely pre-renal-POA -will need volume resuscitation per primary  Chronic anemia- at baseline, POA -transfuse for Hb<7 or hemodynamically significant bleeding  Best Practice  Diet/type: NPO Code Status:  full code Last date of multidisciplinary goals of care discussion Per Primary   Labs   CBC: Recent Labs  Lab 03/22/21 1255  WBC 11.5*  HGB 11.7*  HCT 41.0  MCV 93.6  PLT 406*    Basic Metabolic Panel: Recent Labs  Lab 03/22/21 1255  NA 139  K 4.5  CL 97*  CO2 28  GLUCOSE 332*  BUN 37*  CREATININE 1.27*  CALCIUM 10.0   GFR: Estimated Creatinine Clearance: 42.5 mL/min (A) (by C-G formula based on SCr of 1.27 mg/dL (H)). Recent Labs  Lab  03/22/21 1255  WBC 11.5*    Liver Function Tests: Recent Labs  Lab 03/22/21 1255  AST 19  ALT 12  ALKPHOS 56  BILITOT 0.7  PROT 8.3*  ALBUMIN 3.6   Recent Labs  Lab 03/22/21 1255  LIPASE 24   No results for input(s): AMMONIA in the last 168 hours.  ABG    Component Value Date/Time   PHART 7.516 (H) 03/08/2021 2004   PCO2ART 45.1 03/08/2021 2004   PO2ART 291 (H) 03/08/2021 2004   HCO3 36.2 (H) 03/08/2021 2004   TCO2 38 (H) 03/08/2021 2004   O2SAT 100.0 03/08/2021 2004     Coagulation Profile: No results for input(s): INR, PROTIME in the last 168 hours.  Cardiac Enzymes: No results for input(s): CKTOTAL, CKMB, CKMBINDEX, TROPONINI in the last 168 hours.  HbA1C: Hgb A1c MFr Bld  Date/Time Value Ref Range Status  12/20/2020 01:17 PM 7.2 (H) 4.8 - 5.6 % Final    Comment:    (NOTE) Pre diabetes:          5.7%-6.4%  Diabetes:              >6.4%  Glycemic control for   <7.0% adults with diabetes     CBG: Recent Labs  Lab 03/22/21 1535  GLUCAP 255*    Review of Systems:   Limited due to patient's communication barriers related to tracheostomy.   Past Medical History:  She,  has a past medical history of Acute on chronic respiratory failure with hypoxia ([place]), ARDS (adult respiratory distress syndrome) ([place]), Chronic kidney disease, stage II (mild), MRSA (methicillin resistant staphylococcus aureus) pneumonia ([place]), and Obstructive chronic bronchitis without exacerbation ([place]).   Surgical History:   Past Surgical History:  Procedure Laterality Date   TRACHEOSTOMY       Social History:   reports that she has never smoked. She has never used smokeless tobacco.   Family History:  Her family history includes CAD in her mother.   Allergies No Known Allergies   Home Medications  Prior to Admission medications   Medication Sig Start Date End Date Taking? Authorizing Provider  acetaminophen (TYLENOL) 325 MG tablet Place 650 mg into feeding tube  every 6 (six) hours as needed for moderate pain.   Yes [provider]  allopurinol (ZYLOPRIM) 100 MG tablet Place 200 mg into feeding tube daily.   Yes [provider]  amiodarone (PACERONE) 200 MG tablet Place 200 mg into feeding tube daily. 10/25/20  Yes [provider]  atorvastatin (LIPITOR) 20 MG tablet Place 20 mg into feeding tube at bedtime. 11/26/20  Yes [provider]  carbidopa-levodopa (SINEMET IR) 25-100 MG tablet Place 1 tablet into feeding tube every 6 (six) hours. Times given : 00:00, 0600; 12:00, 1800 10/28/20  Yes [provider]  chlorhexidine (PERIDEX) 0.12 % solution Use as directed 15 mLs in the mouth or throat 2 (two) times daily.   Yes [provider]  cholecalciferol (VITAMIN D3) 25 MCG (1000 UNIT) tablet Place 1,000 Units into feeding tube daily.   Yes [provider]  cloNIDine (CATAPRES) 0.1 MG tablet Place 0.1 mg into  feeding tube every 8 (eight) hours as needed (SBP >180).   Yes [provider]  divalproex (DEPAKOTE SPRINKLE) 125 MG capsule 500 mg 2 (two) times daily. Via tube 11/24/20  Yes [provider]  FLUoxetine (PROZAC) 10 MG tablet Place 10 mg into feeding tube daily. 11/08/20  Yes [provider]  furosemide (LASIX) 20 MG tablet Place 20 mg into feeding tube every 12 (twelve) hours. 12/02/20  Yes [provider]  Glucagon, rDNA, (GLUCAGON EMERGENCY) 1 MG KIT Inject 1 mg as directed as needed (blood sugar less than 70).   Yes [provider]  guaiFENesin 200 MG tablet Place 400 mg into feeding tube every 8 (eight) hours.   Yes [provider]  insulin aspart (NOVOLOG) 100 UNIT/ML injection Inject 0-10 Units into the skin every 6 (six) hours. Sliding scale: CBG 150 = 0 units, 151-200 = 2 units, 201-250 = 4 units, 251-300 = 6 units, 301-350 = 8 units, 351-400 = 10 units.   Yes [provider]  insulin glargine (LANTUS) 100 UNIT/ML injection Inject  60 Units into the skin at bedtime.   Yes [provider]  ipratropium-albuterol (DUONEB) 0.5-2.5 (3) MG/3ML SOLN Take 3 mLs by nebulization every 6 (six) hours.   Yes [provider]  magnesium oxide (MAG-OX) 400 MG tablet Place 400 mg into feeding tube 2 (two) times daily.   Yes [provider]  memantine (NAMENDA) 5 MG tablet Place 5 mg into feeding tube 2 (two) times daily. 11/26/20  Yes [provider]  metoprolol tartrate (LOPRESSOR) 25 MG tablet Place 25 mg into feeding tube 2 (two) times daily.   Yes [provider]  OLANZapine (ZYPREXA) 10 MG tablet Place 10 mg into feeding tube at bedtime. 11/16/20  Yes [provider]  ondansetron (ZOFRAN) 4 MG/2ML SOLN injection Inject 4 mg into the vein every 6 (six) hours as needed for nausea or vomiting.   Yes [provider]  ondansetron (ZOFRAN) 8 MG tablet Place 8 mg into feeding tube every 8 (eight) hours as needed for nausea or vomiting.   Yes [provider]  pantoprazole sodium (PROTONIX) 40 mg/20 mL PACK Place 40 mg into feeding tube 2 (two) times daily.   Yes [provider]  polyethylene glycol (MIRALAX / GLYCOLAX) 17 g packet Place 17 g into feeding tube daily.   Yes [provider]  Potassium Chloride 40 MEQ/15ML (20%) SOLN Give 40 mEq by tube daily.   Yes [provider]  pramipexole (MIRAPEX) 0.125 MG tablet Place 0.125 mg into feeding tube every 8 (eight) hours. 11/05/20  Yes [provider]  propylthiouracil (PTU) 50 MG tablet Place 50 mg into feeding tube every 8 (eight) hours. 10/19/20  Yes [provider]  Semaglutide,0.25 or 0.5MG/DOS, (OZEMPIC, 0.25 OR 0.5 MG/DOSE,) 2 MG/1.5ML SOPN Inject 0.25 mg into the skin once a week.   Yes [provider]  senna-docusate (SENOKOT-S) 8.6-50 MG tablet Place 2 tablets into feeding tube at bedtime.   Yes [provider]  Sodium Chloride Flush (NORMAL SALINE FLUSH IV)  Inject 10 mLs into the vein See admin instructions. Flush with 32ms before and after medications if needed to flush IV   Yes [provider]     This patient is critically ill with multiple organ system failure which requires frequent high complexity decision making, assessment, support, evaluation, and titration of therapies. This was completed through the application of advanced monitoring technologies and extensive interpretation of multiple databases.  During this encounter critical care time was devoted to patient care services described in this note for 40 minutes.   Julian Hy, DO 03/22/21 7:15 PM [facility]

## 2021-03-22 NOTE — H&P (Signed)
Date: 03/22/2021               Patient Name:  Lindsey Collins MRN: [id number]  DOB: April 21, 1946 Age / Sex: 75 y.o., female   PCP: Lindsey Roger, MD         Medical Service: Internal Medicine Teaching Service         Attending Physician: Dr. Evette Collins, Lindsey Collins, *    First Contact: Lindsey Denis, MD Pager: [phone]  Second Contact: Lindsey Snider, MD Pager: [phone]       After Hours (After 5p/  First Contact Pager: [phone]  weekends / holidays): Second Contact Pager: [phone]   Chief Complaint: abdominal pain with distention, vomiting  History of Present Illness: Lindsey Collins. [name] is a 75 yo female with a PMHx of acute on chronic respiratory failure with hypoxia on vent with trach placement, CKD stage II, Parkinson's, type 2 diabetes, HFpEF. A limited history was obtained from the patient as she is non-verbal, but she is able to communicate through writing. Most of the history was obtained from chart review. Patient is presenting from [place] after having episodes of abdominal pain with distention, nausea, and vomiting that began about 2 days ago. X-ray done at the facility concerning for small bowel obstruction. Of note, she was recently admitted from 6/26-6/27 with a similar presentation. At this visit, she was admitted to critical care and had PEG tube placed and hooked to suction. Patient was discharged after sxs resolved 1 day later. Today the patient is reporting her abdominal pain has returned, mostly limited to the right lower quadrant. She is nauseous and has had multiple episodes of emesis. Patient denies any hematemesis. She is also reporting loose stools, but denies any melena or hematochezia.   In the ED, patient had temperature of 99.3, tachypnea with resp rate of 26 on vent, and tachycardia with HR 105. Labwork significant for elevated glucose level of 332 on arrival, elevated Cr at 1.27 (last Cr 0.81 on 6/25), leukocytosis of 11.5, Hb 11.7, and platelets elevated at 406. CT  abd/pelvis showing dilated fluid-filled small bowel loops with air-fluid levels into pelvis and distal small bowel decompressed, concerning for distal small bowel obstruction. PCCM consulted for vent management, surgery consulted for SBO management, and IMTS consulted for admission.   Meds:  Current Meds  Medication Sig   acetaminophen (TYLENOL) 325 MG tablet Place 650 mg into feeding tube every 6 (six) hours as needed for moderate pain.   allopurinol (ZYLOPRIM) 100 MG tablet Place 200 mg into feeding tube daily.   amiodarone (PACERONE) 200 MG tablet Place 200 mg into feeding tube daily.   atorvastatin (LIPITOR) 20 MG tablet Place 20 mg into feeding tube at bedtime.   carbidopa-levodopa (SINEMET IR) 25-100 MG tablet Place 1 tablet into feeding tube every 6 (six) hours. Times given : 00:00, 0600; 12:00, 1800   chlorhexidine (PERIDEX) 0.12 % solution Use as directed 15 mLs in the mouth or throat 2 (two) times daily.   cholecalciferol (VITAMIN D3) 25 MCG (1000 UNIT) tablet Place 1,000 Units into feeding tube daily.   cloNIDine (CATAPRES) 0.1 MG tablet Place 0.1 mg into feeding tube every 8 (eight) hours as needed (SBP >180).   divalproex (DEPAKOTE SPRINKLE) 125 MG capsule 500 mg 2 (two) times daily. Via tube   FLUoxetine (PROZAC) 10 MG tablet Place 10 mg into feeding tube daily.   furosemide (LASIX) 20 MG tablet Place 20 mg into feeding tube every 12 (twelve) hours.  Glucagon, rDNA, (GLUCAGON EMERGENCY) 1 MG KIT Inject 1 mg as directed as needed (blood sugar less than 70).   guaiFENesin 200 MG tablet Place 400 mg into feeding tube every 8 (eight) hours.   insulin aspart (NOVOLOG) 100 UNIT/ML injection Inject 0-10 Units into the skin every 6 (six) hours. Sliding scale: CBG 150 = 0 units, 151-200 = 2 units, 201-250 = 4 units, 251-300 = 6 units, 301-350 = 8 units, 351-400 = 10 units.   insulin glargine (LANTUS) 100 UNIT/ML injection Inject 60 Units into the skin at bedtime.   ipratropium-albuterol  (DUONEB) 0.5-2.5 (3) MG/3ML SOLN Take 3 mLs by nebulization every 6 (six) hours.   magnesium oxide (MAG-OX) 400 MG tablet Place 400 mg into feeding tube 2 (two) times daily.   memantine (NAMENDA) 5 MG tablet Place 5 mg into feeding tube 2 (two) times daily.   metoprolol tartrate (LOPRESSOR) 25 MG tablet Place 25 mg into feeding tube 2 (two) times daily.   OLANZapine (ZYPREXA) 10 MG tablet Place 10 mg into feeding tube at bedtime.   ondansetron (ZOFRAN) 4 MG/2ML SOLN injection Inject 4 mg into the vein every 6 (six) hours as needed for nausea or vomiting.   ondansetron (ZOFRAN) 8 MG tablet Place 8 mg into feeding tube every 8 (eight) hours as needed for nausea or vomiting.   pantoprazole sodium (PROTONIX) 40 mg/20 mL PACK Place 40 mg into feeding tube 2 (two) times daily.   polyethylene glycol (MIRALAX / GLYCOLAX) 17 g packet Place 17 g into feeding tube daily.   Potassium Chloride 40 MEQ/15ML (20%) SOLN Give 40 mEq by tube daily.   pramipexole (MIRAPEX) 0.125 MG tablet Place 0.125 mg into feeding tube every 8 (eight) hours.   propylthiouracil (PTU) 50 MG tablet Place 50 mg into feeding tube every 8 (eight) hours.   Semaglutide,0.25 or 0.5MG/DOS, (OZEMPIC, 0.25 OR 0.5 MG/DOSE,) 2 MG/1.5ML SOPN Inject 0.25 mg into the skin once a week.   senna-docusate (SENOKOT-S) 8.6-50 MG tablet Place 2 tablets into feeding tube at bedtime.   Sodium Chloride Flush (NORMAL SALINE FLUSH IV) Inject 10 mLs into the vein See admin instructions. Flush with 22ms before and after medications if needed to flush IV     Allergies: Allergies as of 03/22/2021   (No Known Allergies)   Past Medical History:  Diagnosis Date   Acute on chronic respiratory failure with hypoxia (HCC)    ARDS (adult respiratory distress syndrome) (HCC)    Chronic kidney disease, stage II (mild)    MRSA (methicillin resistant staphylococcus aureus) pneumonia (H[facility])    Obstructive chronic bronchitis without exacerbation (H[facility])     Family  History: CAD, mother  Social History: Patient comes from [facility] she is nonverbal at baseline due to chronic trach. No tobacco use history. Has been at [facility], prior to this was at another nursing facility due to Parkinson's. Pt unable to complete any of her ADLs as patient is bed bound.    Review of Systems: A complete ROS was negative except as per HPI.   Physical Exam: Blood pressure 124/79, pulse (!) 105, temperature 99.3 F (37.4 C), temperature source Axillary, resp. rate (!) 28, height _0  (1.626 m), weight 91 kg, SpO2 99 %. Physical Exam Constitutional:      General: She is not in acute distress.    Appearance: She is ill-appearing.  HENT:     Mouth/Throat:     Comments: Mouth full of yellow secretions  Eyes:  Pupils: Pupils are equal, round, and reactive to light.  Neck:     Comments: Trach in place Cardiovascular:     Rate and Rhythm: Regular rhythm. Tachycardia present.     Pulses: Normal pulses.     Heart sounds: Normal heart sounds. No murmur heard. Pulmonary:     Effort: No respiratory distress.     Breath sounds: Rhonchi present.  Abdominal:     General: There is distension.     Tenderness: There is abdominal tenderness in the right lower quadrant and left lower quadrant.     Comments: PEG tube on left  Musculoskeletal:     Right lower leg: Edema present.     Left lower leg: Edema present.     Comments: Trace bilateral lower extremity edema  Skin:    General: Skin is warm and dry.     Comments: Decreased skin turgor   Neurological:     Mental Status: She is alert.    EKG: personally reviewed my interpretation is: normal sinus rhythm, HR 97 bpm. Biphasic p waves noted likely consistent with LA enlargement   Assessment & Plan by Problem: Active Problems:   Small bowel obstruction ([place])  Lindsey Collins. [place] is a 75 yo female with a PMHx of acute on chronic respiratory failure with hypoxia on vent with trach placement, CKD stage II,  Parkinson's, type 2 diabetes, HFpEF who presented with abdominal pain with distention and vomiting and was found to have a SBO on imaging.   #Small bowel obstruction Patient presenting with abdominal pain with distention and vomiting. CT abd/pelvis showing dilated fluid filled small bowel loops with air fluid levels into pelvis. Distal small bowel is decompressed, findings consistent with small bowel obstruction - NPO, bowel rest  - IV fluid resuscitation 100cc/hr LR - Zofran q6h PRN for nausea - Trending electrolytes  - CBC, BMP, Mg, and Phos ordered for AM - Surgery consulted, appreciate recommendations   - PEG placed to low intermittent suction to empty out stomach  - Gastrografin study  #Acute on chronic respiratory failure with hypoxia, vent dependent with tracheostomy Patient's breathing remains at baseline. - Critical care consulted for ventilator support management   #Probable aspiration pneumonia in LLL  LLL infiltrate seen on CT abd/pelvis, likely secondary to aspiration pneumonia. Mild leukocytosis of 11.7 noted on CBC.  - Pending trach aspirate and blood cultures - Empiric zosyn started, deescalate after cultures come back   #AKI likely prerenal 2/2 to dehydration  Patient presented with elevated Cr at 1.27, last Cr during hospital stay (6/25) was 0.81. Patient likely dehydrated secondary to multiple episodes of emesis and diarrhea.  - IV fluid resuscitation as above - Monitor renal function    #Diabetes mellitus, type 2 Glucose level on presentation to the ED 332. Patient was given 10 units of novoLOG and glucose level decreased to 218. Last A1c in April 7.2 - Lantus 60 units at bedtime - HbA1c ordered  - Continue to monitor glucose levels   #Diastolic heart failure Patient had echo done at [facility] in March showing preserved EF with mild LVH noted.  - Hold home lasix for now  - Strict I&Os and daily weights  #Atrial fibrillation, paroxysmal  Patient found to  have Afib with RVR on admission in May. At that time, Afib converted back to sinus rhythm after patient was treated for sepsis. Patient is on amiodarone 219m and metoprolol 2670mat home.  - Hold amiodarone for now - Start IV lopressor [id number]  -  TSH level pending   #Parkinsons  - Will resume home meds once patient is able to take PO meds   Diet: NPO Code status: Full code  Dispo: Admit patient to Observation with expected length of stay less than 2 midnights.  Signed: Dorethea Clan, DO 03/22/2021, 10:02 PM  After 5pm on weekdays and 1pm on weekends: On Call pager: [phone]

## 2021-03-22 NOTE — ED Provider Notes (Signed)
[facility] EMERGENCY DEPARTMENT Provider Note   CSN: [id number] Arrival date & time: 03/22/21  1230     History Chief Complaint  Patient presents with   Abdominal Pain    Lindsey Collins is a 75 y.o. female.  Patient brought in by [facility] from [place] [facility].  Patient with reports of abdominal pain distention and vomiting.  Patient had an x-ray done at the facility showing bowel obstruction.  Patient's temp here 99.3.  Respirations 24 blood pressure 134/86.  Heart rate 96.  Oxygen saturations is 100%.  Patient was admitted June 25 of this year for similar presentation.  Patient was admitted by critical care they put the PEG tube was hooked up to suctioning.  General surgery was consulted.  Symptoms resolved.  Patient had a CT scan abdomen without contrast done at that time that was concerning for small bowel obstruction.  Patient's symptoms resolved while in the hospital.  Patient is a chronic vent patient has a trach in place.  Has a PEG tube as well.  Past medical history is significant for acute on chronic respiratory failure with hypoxia.  Methicillin-resistant staph aureus pneumonia.  Adult respiratory distress syndrome.  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  In addition based on medications I think patient also has diabetes.      Past Medical History:  Diagnosis Date   Acute on chronic respiratory failure with hypoxia (HCC)    ARDS (adult respiratory distress syndrome) (HCC)    Chronic kidney disease, stage II (mild)    MRSA (methicillin resistant staphylococcus aureus) pneumonia (HCC)    Obstructive chronic bronchitis without exacerbation ([place])     Patient Active Problem List   Diagnosis Date Noted   SBO (small bowel obstruction) ([place]) 03/09/2021   ARDS (adult respiratory distress syndrome) ([place]) 01/14/2021   Community acquired pneumonia of right lung    Chronic respiratory failure with hypoxia ([place])  12/20/2020   Status post tracheostomy ([place]) 12/20/2020   Normocytic anemia 12/20/2020   Parkinson's disease ([place]) 12/20/2020   Diabetes mellitus type 2, uncontrolled ([place]) 12/20/2020   Acute on chronic respiratory failure with hypoxia and hypercapnia (HCC)    MRSA (methicillin resistant staphylococcus aureus) pneumonia (HCC)    MRSA (methicillin resistant staphylococcus aureus) pneumonia (HCC)    Obstructive chronic bronchitis without exacerbation (HCC)    Chronic kidney disease, stage II (mild)     Past Surgical History:  Procedure Laterality Date   TRACHEOSTOMY       OB History   No obstetric history on file.     Family History  Problem Relation Age of Onset   CAD Mother        hx CABG    Social History   Tobacco Use   Smoking status: Never   Smokeless tobacco: Never    Home Medications Prior to Admission medications   Medication Sig Start Date End Date Taking? Authorizing Provider  acetaminophen (TYLENOL) 325 MG tablet Place 650 mg into feeding tube every 6 (six) hours as needed for moderate pain.   Yes [provider]  allopurinol (ZYLOPRIM) 100 MG tablet Place 200 mg into feeding tube daily.   Yes [provider]  amiodarone (PACERONE) 200 MG tablet Place 200 mg into feeding tube daily. 10/25/20  Yes [provider]  atorvastatin (LIPITOR) 20 MG tablet Place 20 mg into feeding tube at bedtime. 11/26/20  Yes [provider]  carbidopa-levodopa (SINEMET IR) 25-100 MG tablet Place 1 tablet  into feeding tube every 6 (six) hours. Times given : 00:00, 0600; 12:00, 1800 10/28/20  Yes [provider]  chlorhexidine (PERIDEX) 0.12 % solution Use as directed 15 mLs in the mouth or throat 2 (two) times daily.   Yes [provider]  cholecalciferol (VITAMIN D3) 25 MCG (1000 UNIT) tablet Place 1,000 Units into feeding tube daily.   Yes [provider]  cloNIDine (CATAPRES) 0.1 MG tablet Place 0.1 mg into feeding tube  every 8 (eight) hours as needed (SBP >180).   Yes [provider]  divalproex (DEPAKOTE SPRINKLE) 125 MG capsule 500 mg 2 (two) times daily. Via tube 11/24/20  Yes [provider]  FLUoxetine (PROZAC) 10 MG tablet Place 10 mg into feeding tube daily. 11/08/20  Yes [provider]  furosemide (LASIX) 20 MG tablet Place 20 mg into feeding tube every 12 (twelve) hours. 12/02/20  Yes [provider]  Glucagon, rDNA, (GLUCAGON EMERGENCY) 1 MG KIT Inject 1 mg as directed as needed (blood sugar less than 70).   Yes [provider]  guaiFENesin 200 MG tablet Place 400 mg into feeding tube every 8 (eight) hours.   Yes [provider]  insulin aspart (NOVOLOG) 100 UNIT/ML injection Inject 0-10 Units into the skin every 6 (six) hours. Sliding scale: CBG 150 = 0 units, 151-200 = 2 units, 201-250 = 4 units, 251-300 = 6 units, 301-350 = 8 units, 351-400 = 10 units.   Yes [provider]  insulin glargine (LANTUS) 100 UNIT/ML injection Inject 60 Units into the skin at bedtime.   Yes [provider]  ipratropium-albuterol (DUONEB) 0.5-2.5 (3) MG/3ML SOLN Take 3 mLs by nebulization every 6 (six) hours.   Yes [provider]  magnesium oxide (MAG-OX) 400 MG tablet Place 400 mg into feeding tube 2 (two) times daily.   Yes [provider]  memantine (NAMENDA) 5 MG tablet Place 5 mg into feeding tube 2 (two) times daily. 11/26/20  Yes [provider]  metoprolol tartrate (LOPRESSOR) 25 MG tablet Place 25 mg into feeding tube 2 (two) times daily.   Yes [provider]  OLANZapine (ZYPREXA) 10 MG tablet Place 10 mg into feeding tube at bedtime. 11/16/20  Yes [provider]  ondansetron (ZOFRAN) 4 MG/2ML SOLN injection Inject 4 mg into the vein every 6 (six) hours as needed for nausea or vomiting.   Yes [provider]  ondansetron (ZOFRAN) 8 MG tablet Place 8 mg into feeding tube every 8 (eight) hours as  needed for nausea or vomiting.   Yes [provider]  pantoprazole sodium (PROTONIX) 40 mg/20 mL PACK Place 40 mg into feeding tube 2 (two) times daily.   Yes [provider]  polyethylene glycol (MIRALAX / GLYCOLAX) 17 g packet Place 17 g into feeding tube daily.   Yes [provider]  Potassium Chloride 40 MEQ/15ML (20%) SOLN Give 40 mEq by tube daily.   Yes [provider]  pramipexole (MIRAPEX) 0.125 MG tablet Place 0.125 mg into feeding tube every 8 (eight) hours. 11/05/20  Yes [provider]  propylthiouracil (PTU) 50 MG tablet Place 50 mg into feeding tube every 8 (eight) hours. 10/19/20  Yes [provider]  Semaglutide,0.25 or 0.5MG/DOS, (OZEMPIC, 0.25 OR 0.5 MG/DOSE,) 2 MG/1.5ML SOPN Inject 0.25 mg into the skin once a week.   Yes [provider]  senna-docusate (SENOKOT-S) 8.6-50 MG tablet Place 2 tablets into feeding tube at bedtime.   Yes [provider]  Sodium Chloride Flush (NORMAL SALINE FLUSH IV) Inject 10 mLs into the vein See admin instructions. Flush with 38ms before and after medications if needed to flush IV   Yes [provider]    Allergies    Patient has no known allergies.  Review of Systems   Review of Systems  Unable to perform ROS: Patient nonverbal  Gastrointestinal:  Positive for abdominal distention, abdominal pain and vomiting.   Physical Exam Updated Vital Signs BP 132/83   Pulse (!) 108   Temp 99.3 F (37.4 C) (Axillary)   Resp 20   Ht 1.626 m (_0 )   Wt 91 kg   SpO2 96%   BMI 34.44 kg/m   Physical Exam Vitals and nursing note reviewed.  Constitutional:      General: She is not in acute distress.    Appearance: Normal appearance. She is well-developed. She is not toxic-appearing.  HENT:     Head: Normocephalic and atraumatic.  Eyes:     Extraocular Movements: Extraocular movements intact.     Conjunctiva/sclera: Conjunctivae normal.     Pupils: Pupils are  equal, round, and reactive to light.  Neck:     Comments: Trach in place. Cardiovascular:     Rate and Rhythm: Normal rate and regular rhythm.     Heart sounds: No murmur heard. Pulmonary:     Effort: Pulmonary effort is normal. No respiratory distress.     Breath sounds: Normal breath sounds.  Abdominal:     General: There is distension.     Palpations: Abdomen is soft.     Tenderness: There is abdominal tenderness.     Comments: PEG tube in place on the left side of the abdomen.  Musculoskeletal:        General: Normal range of motion.     Cervical back: Normal range of motion and neck supple.  Skin:    General: Skin is warm and dry.  Neurological:     Mental Status: She is alert and oriented to person, place, and time. Mental status is at baseline.    ED Results / Procedures / Treatments   Labs (all labs ordered are listed, but only abnormal results are displayed) Labs Reviewed  COMPREHENSIVE METABOLIC PANEL - Abnormal; Notable for the following components:      Result Value   Chloride 97 (*)    Glucose, Bld 332 (*)    BUN 37 (*)    Creatinine, Ser 1.27 (*)    Total Protein 8.3 (*)    GFR, Estimated 44 (*)    All other components within normal limits  CBC - Abnormal; Notable for the following components:   WBC 11.5 (*)    Hemoglobin 11.7 (*)    MCHC 28.5 (*)    RDW 16.3 (*)    Platelets 406 (*)    All other components within normal limits  LIPASE, BLOOD    EKG EKG Interpretation  Date/Time:  Saturday March 22 2021 12:40:05 EDT Ventricular Rate:  97 PR Interval:  151 QRS Duration: 83 QT Interval:  386 QTC Calculation: 491 R Axis:   69 Text Interpretation: Sinus rhythm Low voltage, precordial leads Borderline T abnormalities, anterior leads Borderline prolonged QT interval Confirmed by ZFredia Sorrow([id number] on 03/22/2021 2:45:58 PM  Radiology No results found.  Procedures Procedures   Medications Ordered in ED Medications - No data to display  ED  Course  I have reviewed the triage vital signs and the nursing notes.  Pertinent labs &  imaging results that were available during my care of the patient were reviewed by me and considered in my medical decision making (see chart for details).    MDM Rules/Calculators/A&P                          Patient with abdominal distention and some pain.  Patient was admitted June 25 through the critical care service with consult by general surgery for presumed small bowel obstruction.  They were able to apply suction to her PEG tube and it resolved on its own.  Patient is sent back here today for similar findings.  Had an x-ray done they are which raise concerns for bowel obstruction.  I will get CT scan abdomen we will do it without contrast as Earnest Bailey did it before and it was helpful patient's renal function is good enough at 44 GFR to do it with IV contrast.  But her IV says she has 2 of them are a little bit peculiar.  Patient's lipase is normal liver function test are normal electrolytes significant for glucose of 332.  We will give a touch of insulin.  Sodium and potassium are normal.  Patient has a mild leukocytosis at 11.5. Final Clinical Impression(s) / ED Diagnoses Final diagnoses:  Hx SBO    Rx / DC Orders ED Discharge Orders     None        Fredia Sorrow, MD 03/22/21 1511

## 2021-03-23 ENCOUNTER — Observation Stay (HOSPITAL_COMMUNITY): Payer: Medicare Other

## 2021-03-23 DIAGNOSIS — J189 Pneumonia, unspecified organism: Secondary | ICD-10-CM | POA: Diagnosis not present

## 2021-03-23 DIAGNOSIS — E860 Dehydration: Secondary | ICD-10-CM | POA: Diagnosis present

## 2021-03-23 DIAGNOSIS — E1122 Type 2 diabetes mellitus with diabetic chronic kidney disease: Secondary | ICD-10-CM | POA: Diagnosis present

## 2021-03-23 DIAGNOSIS — N179 Acute kidney failure, unspecified: Secondary | ICD-10-CM | POA: Diagnosis present

## 2021-03-23 DIAGNOSIS — Z9911 Dependence on respirator [ventilator] status: Secondary | ICD-10-CM

## 2021-03-23 DIAGNOSIS — I5032 Chronic diastolic (congestive) heart failure: Secondary | ICD-10-CM | POA: Diagnosis present

## 2021-03-23 DIAGNOSIS — G20 Parkinson's disease: Secondary | ICD-10-CM | POA: Diagnosis present

## 2021-03-23 DIAGNOSIS — J449 Chronic obstructive pulmonary disease, unspecified: Secondary | ICD-10-CM | POA: Diagnosis present

## 2021-03-23 DIAGNOSIS — Z6834 Body mass index (BMI) 34.0-34.9, adult: Secondary | ICD-10-CM | POA: Diagnosis not present

## 2021-03-23 DIAGNOSIS — D649 Anemia, unspecified: Secondary | ICD-10-CM | POA: Diagnosis present

## 2021-03-23 DIAGNOSIS — L8990 Pressure ulcer of unspecified site, unspecified stage: Secondary | ICD-10-CM | POA: Insufficient documentation

## 2021-03-23 DIAGNOSIS — J9611 Chronic respiratory failure with hypoxia: Secondary | ICD-10-CM | POA: Diagnosis not present

## 2021-03-23 DIAGNOSIS — K56600 Partial intestinal obstruction, unspecified as to cause: Secondary | ICD-10-CM | POA: Diagnosis present

## 2021-03-23 DIAGNOSIS — I480 Paroxysmal atrial fibrillation: Secondary | ICD-10-CM | POA: Diagnosis present

## 2021-03-23 DIAGNOSIS — Z20822 Contact with and (suspected) exposure to covid-19: Secondary | ICD-10-CM | POA: Diagnosis present

## 2021-03-23 DIAGNOSIS — J9621 Acute and chronic respiratory failure with hypoxia: Secondary | ICD-10-CM | POA: Diagnosis present

## 2021-03-23 DIAGNOSIS — Z930 Tracheostomy status: Secondary | ICD-10-CM | POA: Diagnosis not present

## 2021-03-23 DIAGNOSIS — E1165 Type 2 diabetes mellitus with hyperglycemia: Secondary | ICD-10-CM | POA: Diagnosis present

## 2021-03-23 DIAGNOSIS — Z1623 Resistance to quinolones and fluoroquinolones: Secondary | ICD-10-CM | POA: Diagnosis present

## 2021-03-23 DIAGNOSIS — K56609 Unspecified intestinal obstruction, unspecified as to partial versus complete obstruction: Secondary | ICD-10-CM

## 2021-03-23 DIAGNOSIS — N182 Chronic kidney disease, stage 2 (mild): Secondary | ICD-10-CM | POA: Diagnosis present

## 2021-03-23 DIAGNOSIS — Z79899 Other long term (current) drug therapy: Secondary | ICD-10-CM | POA: Diagnosis not present

## 2021-03-23 DIAGNOSIS — J690 Pneumonitis due to inhalation of food and vomit: Secondary | ICD-10-CM | POA: Diagnosis present

## 2021-03-23 DIAGNOSIS — L89151 Pressure ulcer of sacral region, stage 1: Secondary | ICD-10-CM | POA: Diagnosis present

## 2021-03-23 DIAGNOSIS — K432 Incisional hernia without obstruction or gangrene: Secondary | ICD-10-CM | POA: Diagnosis present

## 2021-03-23 DIAGNOSIS — Z931 Gastrostomy status: Secondary | ICD-10-CM | POA: Diagnosis not present

## 2021-03-23 LAB — T4, FREE: Free T4: 1.18 ng/dL — ABNORMAL HIGH (ref 0.61–1.12)

## 2021-03-23 LAB — BASIC METABOLIC PANEL
Anion gap: 16 — ABNORMAL HIGH (ref 5–15)
BUN: 44 mg/dL — ABNORMAL HIGH (ref 8–23)
CO2: 27 mmol/L (ref 22–32)
Calcium: 10.4 mg/dL — ABNORMAL HIGH (ref 8.9–10.3)
Chloride: 97 mmol/L — ABNORMAL LOW (ref 98–111)
Creatinine, Ser: 1.55 mg/dL — ABNORMAL HIGH (ref 0.44–1.00)
GFR, Estimated: 35 mL/min — ABNORMAL LOW (ref >60)
Glucose, Bld: 260 mg/dL — ABNORMAL HIGH (ref 70–99)
Potassium: 3.3 mmol/L — ABNORMAL LOW (ref 3.5–5.1)
Sodium: 140 mmol/L (ref 135–145)

## 2021-03-23 LAB — MRSA NEXT GEN BY PCR, NASAL: MRSA by PCR Next Gen: NOT DETECTED

## 2021-03-23 LAB — CBC WITH DIFFERENTIAL/PLATELET
Abs Immature Granulocytes: 0.05 10*3/uL (ref 0.00–0.07)
Basophils Absolute: 0 10*3/uL (ref 0.0–0.1)
Basophils Relative: 0 %
Eosinophils Absolute: 0 10*3/uL (ref 0.0–0.5)
Eosinophils Relative: 0 %
HCT: 40.1 % (ref 36.0–46.0)
Hemoglobin: 12.1 g/dL (ref 12.0–15.0)
Immature Granulocytes: 0 %
Lymphocytes Relative: 16 %
Lymphs Abs: 1.9 10*3/uL (ref 0.7–4.0)
MCH: 27.3 pg (ref 26.0–34.0)
MCHC: 30.2 g/dL (ref 30.0–36.0)
MCV: 90.5 fL (ref 80.0–100.0)
Monocytes Absolute: 1 10*3/uL (ref 0.1–1.0)
Monocytes Relative: 9 %
Neutro Abs: 8.6 10*3/uL — ABNORMAL HIGH (ref 1.7–7.7)
Neutrophils Relative %: 75 %
Platelets: 438 10*3/uL — ABNORMAL HIGH (ref 150–400)
RBC: 4.43 MIL/uL (ref 3.87–5.11)
RDW: 16.4 % — ABNORMAL HIGH (ref 11.5–15.5)
WBC: 11.6 10*3/uL — ABNORMAL HIGH (ref 4.0–10.5)
nRBC: 0 % (ref 0.0–0.2)

## 2021-03-23 LAB — CBG MONITORING, ED
Glucose-Capillary: 185 mg/dL — ABNORMAL HIGH (ref 70–99)
Glucose-Capillary: 124 mg/dL — ABNORMAL HIGH (ref 70–99)
Glucose-Capillary: 103 mg/dL — ABNORMAL HIGH (ref 70–99)

## 2021-03-23 LAB — HEMOGLOBIN A1C
Hgb A1c MFr Bld: 7.2 % — ABNORMAL HIGH (ref 4.8–5.6)
Mean Plasma Glucose: 159.94 mg/dL

## 2021-03-23 LAB — PROCALCITONIN: Procalcitonin: <0.1 ng/mL

## 2021-03-23 LAB — PHOSPHORUS
Phosphorus: 1.7 mg/dL — ABNORMAL LOW (ref 2.5–4.6)
Phosphorus: 1.8 mg/dL — ABNORMAL LOW (ref 2.5–4.6)

## 2021-03-23 LAB — MAGNESIUM
Magnesium: 2.6 mg/dL — ABNORMAL HIGH (ref 1.7–2.4)
Magnesium: 2.5 mg/dL — ABNORMAL HIGH (ref 1.7–2.4)

## 2021-03-23 LAB — TSH: TSH: 7.927 u[IU]/mL — ABNORMAL HIGH (ref 0.350–4.500)

## 2021-03-23 MED ORDER — CHLORHEXIDINE GLUCONATE 0.12% ORAL RINSE (MEDLINE KIT)
15.0000 mL | Freq: Two times a day (BID) | OROMUCOSAL | Status: DC
  Administered 2021-03-23 – 2021-03-26 (×6): 15 mL via OROMUCOSAL

## 2021-03-23 MED ORDER — SODIUM CHLORIDE 3 % IN NEBU
4.0000 mL | INHALATION_SOLUTION | Freq: Three times a day (TID) | RESPIRATORY_TRACT | Status: AC
  Administered 2021-03-23 – 2021-03-25 (×8): 4 mL via RESPIRATORY_TRACT
  Filled 2021-03-23 (×9): qty 4

## 2021-03-23 MED ORDER — ORAL CARE MOUTH RINSE
15.0000 mL | OROMUCOSAL | Status: DC
  Administered 2021-03-24 – 2021-03-26 (×26): 15 mL via OROMUCOSAL

## 2021-03-23 MED ORDER — POTASSIUM PHOSPHATES 15 MMOLE/5ML IV SOLN
20.0000 mmol | Freq: Once | INTRAVENOUS | Status: AC
  Administered 2021-03-23: 20 mmol via INTRAVENOUS
  Filled 2021-03-23: qty 6.67

## 2021-03-23 MED ORDER — CHLORHEXIDINE GLUCONATE CLOTH 2 % EX PADS
6.0000 | MEDICATED_PAD | Freq: Every day | CUTANEOUS | Status: DC
  Administered 2021-03-23 – 2021-03-26 (×5): 6 via TOPICAL

## 2021-03-23 MED ORDER — POTASSIUM CHLORIDE 10 MEQ/100ML IV SOLN
10.0000 meq | INTRAVENOUS | Status: AC
  Administered 2021-03-23 (×4): 10 meq via INTRAVENOUS
  Filled 2021-03-23 (×5): qty 100

## 2021-03-23 MED ORDER — IPRATROPIUM-ALBUTEROL 0.5-2.5 (3) MG/3ML IN SOLN
3.0000 mL | Freq: Four times a day (QID) | RESPIRATORY_TRACT | Status: DC
  Administered 2021-03-23 – 2021-03-26 (×14): 3 mL via RESPIRATORY_TRACT
  Filled 2021-03-23 (×14): qty 3

## 2021-03-23 MED ORDER — DEXTROSE 50 % IV SOLN
12.5000 g | INTRAVENOUS | Status: AC
  Administered 2021-03-23: 12.5 g via INTRAVENOUS
  Filled 2021-03-23: qty 50

## 2021-03-23 MED ORDER — DEXTROSE-NACL 5-0.45 % IV SOLN: INTRAVENOUS | Status: DC

## 2021-03-23 NOTE — Progress Notes (Signed)
Subjective: Patient indicates she is not in pain, and feeling a little better. Not able to fully understand some of what the patient says. She is ventilator-dependent on trach and not able to produce audible sounds. Patient asked in writing if she would need  ostomy bag. Her facial expression showed she is probably not a fan of ostomy bags. Care team reassured her there is no indication at this time. Objective: Vital signs in last 24 hours: Vitals:   03/23/21 1200 03/23/21 1230 03/23/21 1300 03/23/21 1330  BP: 128/72 117/63 (!) 143/69 (!) 143/59  Pulse: 79 76 74 77  Resp: (!) 26 19 (!) 26 (!) 24  Temp:      TempSrc:      SpO2: 100% 100% 99% 100%  Weight:      Height:       Weight change:   Intake/Output Summary (Last 24 hours) at 03/23/2021 1345 Last data filed at 03/23/2021 1140 Gross per 24 hour  Intake --  Output 1525 ml  Net -1525 ml   Medications:  Scheduled Meds:  chlorhexidine  15 mL Mouth/Throat BID   enoxaparin (LOVENOX) injection  40 mg Subcutaneous Q24H   insulin aspart  0-15 Units Subcutaneous Q4H   insulin glargine  60 Units Subcutaneous QHS   ipratropium-albuterol  3 mL Nebulization Q6H   metoprolol tartrate  2.5 mg Intravenous Q6H   sodium chloride HYPERTONIC  4 mL Nebulization TID   Continuous Infusions:  lactated ringers 100 mL/hr at 03/23/21 1342   piperacillin-tazobactam (ZOSYN)  IV 3.375 g (03/23/21 1341)   PRN Meds:.acetaminophen **OR** acetaminophen, ondansetron  General:  well-appearing middle-aged female on ventilator connected to trach,  laying in bed. No acute distress. Communicates via writing. Could make out some words by lip reading. Head: Normocephalic. Atraumatic. CV: RRR. No murmurs, rubs, or gallops. No LE edema Pulmonary: Lungs CTAB. Trach collar connected to ventilator Abdominal: Distended non-tender abdomen, hypoactive bowel sounds. Midline abdominal scar with periumbilical bulging. PEG tube present in LUQ  Extremities: Palpable pulses.  Normal ROM. Skin: Warm and dry. No obvious rash or lesions. Neuro: A&Ox3. Moves all extremities. Normal sensation. No focal deficit. Psych: Normal mood and affect  Assessment/Plan: Lindsey Collins is 75 y.o. female with history of acute on chronic respiratory failure now vent dependent through trach, CKD stage II, Parkinson's, and abdominal surgery who presented with abdominal pain and found to have small bowel obstruction. She is currently undergoing medical management with g-tube suction with close following by surgery team. Principal Problem:   Small bowel obstruction ([place]) Active Problems:   Status post tracheostomy ([place])   Parkinson's disease ([place])   Diabetes mellitus type 2, uncontrolled ([place])   Ventilator dependent ([place])  Acute small bowel obstruction. There is interval improvement in abdominal distention. G-tube suction with up to 4L output of gastric content and nursing reports patient having several large bowel movements.  -NPO -PEG tube to LIS -Daily BMP, replete electrolytes prn -Surgery following, appreciate involvement.  Acute on chronic respiratory failure with hypoxia  Vent dependent with chronic tracheostomy  Dependent on ventilator with minimal settings (PRVC mode, PEEP of 5, FiO2 28% and Vt 500). No acute exacerbation of COPD.  -vent managed by pulmonology.  Probable LLL pneumonia CT abd/pelvis with incidental finding of left lower lobe infiltrate, likely secondary to aspiration pneumonia. Mild leukocytosis of 11.7 noted on CBC. Patient was started on empiric antibiotics in the ED. -continue zosyn 3.375 g IV q8hrs x 7 days per pulmonology team.  -f/u blood  culture  AKI, likely pre-renal Elevated creatine likely due to dehydration from poor oral intake in context of bowel obstruction. Her PEG tube put out close to 4L of gastric content so would replaced lost fluid to prevent further dehydration which may precipitate further kidney injury.  -Daily BMP -IV fluids    Parkinson Disease Continue home regimen  Atrial fibrillation, paroxysmal Patient found to have Afib with RVR on previous hospital stay in May while admitted for sepsis and has been on amiodarone since. Patient now back in sinus rhythm. Holding amiodarone and put telemetry with tentative plan to discontinue amiodarone altogether if no A.fib captured. Prior A.fib likely precipitated by stress from illness. -hold amiodarone -telemetry  This is a Careers information officer Note.  The care of the patient was discussed with Dr. Lalla Brothers MD and the assessment and plan formulated with their assistance.   LOS: 0 days   AsempahCatalina Pizza, Medical Student 03/23/2021, 1:45 PM

## 2021-03-23 NOTE — Progress Notes (Signed)
NAME:  Lindsey Collins, MRN:  [id number], DOB:  [birth year], LOS: 0 ADMISSION DATE:  03/22/2021, CONSULTATION DATE:  03/22/2021 REFERRING MD:  Dr. Alvino Chapel, CHIEF COMPLAINT:  SBO with chronic Vent dependent    History of Present Illness:  Lindsey Collins is a 75 y.o. female from [place] with a PMH significant for acute on chronic respiratory failure with hypoxia now vent dependent through trach, CKD stage II, Parkinson's, and diabetes who presented from [facility] with reports of abdominal pain with distention and emesis. No longer vomiting, but still having abdominal pain.  Her breathing feels at baseline. Of note patient was recently treated at this facility 6/26 - 6/27 for similar presentation and treated with medical management with improvement and discharge back to [facility]. Admission in May 2022 for ARDS due to [place] pneumonia.  On ED arrival patient was seen with temp of 99.3 and mild tachypnea with respiratory rate of 26 on vent through trach. Labwork significant for glucose 332, creatinine 1.27, WBC 11.5, HGB 11.7. CT ABD/pelvis was obtained and consistent with distal small bowel obstruction, left lower lobe opacity also seen. PCCM consulted to help manage chronic vent.  Pertinent  Medical History  Acute on chronic respiratory failure with hypoxia now vent dependent through trach, CKD stage II, Parkinson's, and diabetes  Significant Hospital Events:  7/9 admitted for recurrent small bowel obstruction; PEG to suction  Interim History / Subjective:  3L fluid from PEG overnight, has now had bowel movements. Still having some pain.  Objective   Blood pressure 116/70, pulse 82, temperature 99.3 F (37.4 C), temperature source Axillary, resp. rate (!) 26, height 5\' 4"  (1.626 m), weight 91 kg, SpO2 99 %.    Vent Mode: PRVC FiO2 (%):  [28 %] 28 % Set Rate:  [26 bmp] 26 bmp Vt Set:  [500 mL] 500 mL PEEP:  [5 cmH20] 5 cmH20 Plateau Pressure:  [28 cmH20-36 cmH20] 36 cmH20  No intake or output  data in the 24 hours ending 03/23/21 0754 Filed Weights   03/22/21 1235  Weight: 91 kg    Examination: General: chronically ill appearing woman lying in bed in NAD HEENT: [place]/AT, eyes anicteric Neck: cuffed Shiley #6, no bleeding Cardiac: reg rate & rhythm, no murmurs Resp: less rhonchi, still copious yellow sputum when suctioned Abd: distended, less tender to palpation Extremities: no clubbing or cyanosis Derm: warm, dry  KUB after gastrograffin> still multiple dilated loops of bowel  Resolved Hospital Problem list     Assessment & Plan:  Acute SBO-POA -con't medical management per surgery. PEG has been used to suction and administration of gastrograffin. -con't NPO status  Acute on chronic respiratory failure with hypoxia,  POA Vent dependent with chronic tracheostomy, POA COPD-no acutely exacerbated -6 cuffed XLT Shiley trach  P: -Con't current vent settings; LTVV 4-8cc/kg IBW  -Routine trach care. Still has Shiley in place since May. Should probably be changed prior to discharge. -VAP prevention protocol -Sedation not required; avoid opiates -No plans for vent wean given chronic baseline respiratory failure. -Titrate FiO2 PRN to maintain SpO2 >88%; still doing well on 28%. -duonebs Q6h  Likely LLL aspiration pneumonia-POA -trach aspirate not collected & blood cultures pending> appear to have been collected after antibiotics were started -con't empiric pip-tazo; at this point cultures are unlikely to be helpful and would plan on her needing a 7-day course -hypertonic saline nebs to help with secretion management; may need CPT as well  AKI, likely pre-renal-POA -management per primary; may still be  dry given high GI losses  Hypophosphatemia hypokalemia -repletion per primary  Chronic anemia- currently hemoconcentrated; POA -transfuse for Hb<7 or hemodynamically significant bleeding  Best Practice  Diet/type: NPO Code Status:  full code Last date of  multidisciplinary goals of care discussion Per Primary   Labs   CBC: Recent Labs  Lab 03/22/21 1255 03/23/21 0059  WBC 11.5* 11.6*  NEUTROABS  --  8.6*  HGB 11.7* 12.1  HCT 41.0 40.1  MCV 93.6 90.5  PLT 406* 438*     Basic Metabolic Panel: Recent Labs  Lab 03/22/21 0102 03/22/21 1255 03/23/21 0059  NA  --  139 140  K  --  4.5 3.3*  CL  --  97* 97*  CO2  --  28 27  GLUCOSE  --  332* 260*  BUN  --  37* 44*  CREATININE  --  1.27* 1.55*  CALCIUM  --  10.0 10.4*  MG 2.6*  --  2.5*  PHOS 1.7*  --  1.8*    GFR: Estimated Creatinine Clearance: 34.8 mL/min (A) (by C-G formula based on SCr of 1.55 mg/dL (H)). Recent Labs  Lab 03/22/21 0102 03/22/21 1255 03/23/21 0059  PROCALCITON <0.10  --   --   WBC  --  11.5* 11.6*      This patient is critically ill with multiple organ system failure which requires frequent high complexity decision making, assessment, support, evaluation, and titration of therapies. This was completed through the application of advanced monitoring technologies and extensive interpretation of multiple databases. During this encounter critical care time was devoted to patient care services described in this note for 35 minutes.    Julian Hy, DO 03/23/21 7:54 AM [facility]

## 2021-03-23 NOTE — Progress Notes (Signed)
eLink Physician-Brief Progress Note Patient Name: Lindsey Collins DOB: [birth year] MRN: [id number]   Date of Service  03/23/2021  HPI/Events of Note  73F from [facility], hx of ventilator dependence with trach, Parkinson's, who presented with abdominal pain and emesis. Imaging showed distal SBO + possible LLL pneumonia.  RN informs me she just had a hypoglycemic episode with CBG 67 in setting of her being NPO (given SBO) and without dextrose fluids.  On exam, patient is awake and alert, mechanically ventilated via trach. HR 89, MAP 67, RR 26, SpO2 100% on 28% FiO2.   eICU Interventions  # Respiratory: - Mechanical ventilation via trach - PNA: Zosyn.  # GI/Endocrine: - SBO: NPO. PEG is to suction. Surgery consulted and following. - Hypoglycemia: Start D5 1/2 NS at 40 mL/hr. Monitor CBGs.  DVT PPX: Lovenox 40 [place] daily GI PPX: Not indicated (chronic vent)      Intervention Category Evaluation Type: New Patient Evaluation  [name] 03/23/2021, 8:48 PM

## 2021-03-23 NOTE — Progress Notes (Signed)
Subjective/Chief Complaint: Patient on vent via trach Communicates that her abdomen feels better Per nursing, she had large output from her G-tube, followed by several large bowel movements.  It does not appear that any gastrografin was administered per SBO protocol   Objective: Vital signs in last 24 hours: Temp:  [99.3 F (37.4 C)] 99.3 F (37.4 C) (07/09 1237) Pulse Rate:  [82-112] 82 (07/10 0630) Resp:  [15-28] 26 (07/10 0630) BP: (105-152)/(64-98) 116/70 (07/10 0630) SpO2:  [96 %-100 %] 99 % (07/10 0723) FiO2 (%):  [28 %] 28 % (07/10 0723) Weight:  [91 kg] 91 kg (07/09 1235)    Intake/Output from previous day: No intake/output data recorded. Intake/Output this shift: No intake/output data recorded.  General appearance: alert, cooperative, morbidly obese, and trached GI: obese, soft, non-tender; reducible incisional hernia to right of umbilicus G-tube to gravity  Lab Results:  Recent Labs    03/22/21 1255 03/23/21 0059  WBC 11.5* 11.6*  HGB 11.7* 12.1  HCT 41.0 40.1  PLT 406* 438*   BMET Recent Labs    03/22/21 1255 03/23/21 0059  NA 139 140  K 4.5 3.3*  CL 97* 97*  CO2 28 27  GLUCOSE 332* 260*  BUN 37* 44*  CREATININE 1.27* 1.55*  CALCIUM 10.0 10.4*   PT/INR No results for input(s): LABPROT, INR in the last 72 hours. ABG No results for input(s): PHART, HCO3 in the last 72 hours.  Invalid input(s): PCO2, PO2  Studies/Results: CT Abdomen Pelvis Wo Contrast  Result Date: 03/22/2021 CLINICAL DATA:  Bowel obstruction suspected EXAM: CT ABDOMEN AND PELVIS WITHOUT CONTRAST TECHNIQUE: Multidetector CT imaging of the abdomen and pelvis was performed following the standard protocol without IV contrast. COMPARISON:  03/08/2021 FINDINGS: Lower chest: Airspace opacity with air bronchograms again noted in the left lower lobe, stable since prior study. No acute findings Hepatobiliary: No focal hepatic abnormality. Gallbladder unremarkable. Pancreas: No focal  abnormality or ductal dilatation. Spleen: No focal abnormality.  Normal size. Adrenals/Urinary Tract: No adrenal abnormality. No focal renal abnormality. No stones or hydronephrosis. Urinary bladder is unremarkable. Stomach/Bowel: Dilated small bowel loops in the abdomen and pelvis. Distal small bowel loops are decompressed. Findings compatible with distal small bowel obstruction. Exact transition not visualized. Postoperative changes in the sigmoid colon. Gastrostomy tube within the stomach. Vascular/Lymphatic: Aortic atherosclerosis. No evidence of aneurysm or adenopathy. Reproductive: Uterus and adnexa unremarkable.  No mass. Other: No free fluid or free air. Musculoskeletal: No acute bony abnormality. IMPRESSION: Dilated fluid-filled small bowel loops with air-fluid levels into the pelvis. Distal small bowel is decompressed. Findings compatible with distal small bowel obstruction. Aortic atherosclerosis. Chronic opacity in the left lower lobe, likely scarring. Electronically Signed   By: Rolm Baptise M.D.   On: 03/22/2021 16:39   DG Abd Portable 1V-Small Bowel Protocol-Position Verification  Result Date: 03/22/2021 CLINICAL DATA:  Abdominal pain, confirmed nasogastric placement. EXAM: PORTABLE ABDOMEN - 1 VIEW COMPARISON:  Same day CT abdomen pelvis. FINDINGS: No radiopaque nasogastric tube visualized. Left upper quadrant percutaneous gastrostomy tube. Similar dilated loops of small bowel, consistent with small bowel obstruction. IMPRESSION: 1. No radiopaque nasogastric tube visualized. 2. Similar dilated loops of small bowel, consistent with small bowel obstruction. Electronically Signed   By: Dahlia Bailiff MD   On: 03/22/2021 22:01    Anti-infectives: Anti-infectives (From admission, onward)    Start     Dose/Rate Route Frequency Ordered Stop   03/22/21 1915  piperacillin-tazobactam (ZOSYN) IVPB 3.375 g  3.375 g 12.5 mL/hr over 240 Minutes Intravenous Every 8 hours 03/22/21 1904          Assessment/Plan: Distal SBO - appears to be resolving Ventral hernia - reducible; may be intermittently obstructing One more day of bowel rest before restarting tube feeds Will follow-up tomorrow to see if the hernia remains reducible   LOS: 0 days    [name] 03/23/2021

## 2021-03-24 ENCOUNTER — Inpatient Hospital Stay (HOSPITAL_COMMUNITY): Payer: Medicare Other

## 2021-03-24 ENCOUNTER — Other Ambulatory Visit: Payer: Self-pay

## 2021-03-24 DIAGNOSIS — K56609 Unspecified intestinal obstruction, unspecified as to partial versus complete obstruction: Secondary | ICD-10-CM | POA: Diagnosis not present

## 2021-03-24 LAB — CBC
HCT: 30.1 % — ABNORMAL LOW (ref 36.0–46.0)
Hemoglobin: 9.2 g/dL — ABNORMAL LOW (ref 12.0–15.0)
MCH: 26.8 pg (ref 26.0–34.0)
MCHC: 30.6 g/dL (ref 30.0–36.0)
MCV: 87.8 fL (ref 80.0–100.0)
Platelets: 331 10*3/uL (ref 150–400)
RBC: 3.43 MIL/uL — ABNORMAL LOW (ref 3.87–5.11)
RDW: 17.2 % — ABNORMAL HIGH (ref 11.5–15.5)
WBC: 9 10*3/uL (ref 4.0–10.5)
nRBC: 0 % (ref 0.0–0.2)

## 2021-03-24 LAB — GLUCOSE, CAPILLARY
Glucose-Capillary: 133 mg/dL — ABNORMAL HIGH (ref 70–99)
Glucose-Capillary: 128 mg/dL — ABNORMAL HIGH (ref 70–99)
Glucose-Capillary: 148 mg/dL — ABNORMAL HIGH (ref 70–99)

## 2021-03-24 LAB — BASIC METABOLIC PANEL
Anion gap: 11 (ref 5–15)
BUN: 36 mg/dL — ABNORMAL HIGH (ref 8–23)
CO2: 27 mmol/L (ref 22–32)
Calcium: 8.6 mg/dL — ABNORMAL LOW (ref 8.9–10.3)
Chloride: 102 mmol/L (ref 98–111)
Creatinine, Ser: 1.47 mg/dL — ABNORMAL HIGH (ref 0.44–1.00)
GFR, Estimated: 37 mL/min — ABNORMAL LOW (ref >60)
Glucose, Bld: 130 mg/dL — ABNORMAL HIGH (ref 70–99)
Potassium: 2.5 mmol/L — CL (ref 3.5–5.1)
Sodium: 140 mmol/L (ref 135–145)

## 2021-03-24 LAB — PHOSPHORUS: Phosphorus: 3.4 mg/dL (ref 2.5–4.6)

## 2021-03-24 LAB — MAGNESIUM: Magnesium: 3.1 mg/dL — ABNORMAL HIGH (ref 1.7–2.4)

## 2021-03-24 MED ORDER — LACTATED RINGERS IV BOLUS
500.0000 mL | Freq: Once | INTRAVENOUS | Status: AC
  Administered 2021-03-24: 500 mL via INTRAVENOUS

## 2021-03-24 MED ORDER — INSULIN GLARGINE 100 UNIT/ML ~~LOC~~ SOLN
30.0000 [IU] | Freq: Every day | SUBCUTANEOUS | Status: DC
  Administered 2021-03-24 – 2021-03-25 (×2): 30 [IU] via SUBCUTANEOUS
  Filled 2021-03-24 (×3): qty 0.3

## 2021-03-24 MED ORDER — POTASSIUM CHLORIDE CRYS ER 20 MEQ PO TBCR
40.0000 meq | EXTENDED_RELEASE_TABLET | ORAL | Status: AC
  Administered 2021-03-24 (×2): 40 meq via ORAL
  Filled 2021-03-24 (×2): qty 2

## 2021-03-24 MED ORDER — MAGNESIUM SULFATE 2 GM/50ML IV SOLN
2.0000 g | Freq: Once | INTRAVENOUS | Status: AC
  Administered 2021-03-24: 2 g via INTRAVENOUS
  Filled 2021-03-24: qty 50

## 2021-03-24 MED ORDER — VITAL 1.5 CAL PO LIQD
1000.0000 mL | ORAL | Status: DC
  Administered 2021-03-24: 1000 mL

## 2021-03-24 MED ORDER — POTASSIUM CHLORIDE 10 MEQ/100ML IV SOLN
10.0000 meq | INTRAVENOUS | Status: AC
  Administered 2021-03-24 (×2): 10 meq via INTRAVENOUS
  Filled 2021-03-24: qty 100

## 2021-03-24 MED ORDER — PROSOURCE TF PO LIQD
45.0000 mL | Freq: Two times a day (BID) | ORAL | Status: DC
  Administered 2021-03-24 – 2021-03-26 (×4): 45 mL
  Filled 2021-03-24 (×4): qty 45

## 2021-03-24 MED ORDER — LACTATED RINGERS IV BOLUS
1000.0000 mL | Freq: Once | INTRAVENOUS | Status: AC
  Administered 2021-03-24: 1000 mL via INTRAVENOUS

## 2021-03-24 MED ORDER — LACTATED RINGERS IV SOLN: INTRAVENOUS | Status: DC

## 2021-03-24 MED ORDER — POTASSIUM CHLORIDE 10 MEQ/100ML IV SOLN
10.0000 meq | INTRAVENOUS | Status: AC
  Administered 2021-03-24 (×2): 10 meq via INTRAVENOUS
  Filled 2021-03-24 (×3): qty 100

## 2021-03-24 NOTE — Progress Notes (Signed)
PHARMACY - PHYSICIAN COMMUNICATION CRITICAL VALUE ALERT - BLOOD CULTURE IDENTIFICATION (BCID)  Lindsey Collins is an 75 y.o. female who presented to [facility] on [facility]/22/2021 with a chief complaint of acute SBO.  Assessment:  Blood cultures 1 of 4 aerobic growing GNR. Patient is on zosyn. WBC normalized, afebrile.   Name of physician (or Provider) Contacted: Erskine Emery  Current antibiotics: Zosyn  Changes to prescribed antibiotics recommended:  Patient is on recommended antibiotics - No changes needed  Benetta Spar, PharmD, BCPS, [facility] Clinical Pharmacist  Please check AMION for all [place] phone numbers After 10:00 PM, call [place] [phone]

## 2021-03-24 NOTE — Plan of Care (Signed)

## 2021-03-24 NOTE — Progress Notes (Signed)
[facility] Progress Note Patient Name: Lindsey Collins DOB: Aug 04, 1946 MRN: [id number]   Date of Service  03/24/2021  HPI/Events of Note  BP 90/48 (MAP 62). Patient is NPO / not on tube feeds and only on D5 1/2 NS at 40 mL/hr.   eICU Interventions  May be slightly volume down. Give 500 mL LR bolus. Obtain AM labs (CBC + BPM).     Intervention Category Intermediate Interventions: Hypotension - evaluation and management  Marily Lente Katalena Malveaux 03/24/2021, 5:14 AM

## 2021-03-24 NOTE — Progress Notes (Signed)
[facility] Progress Note Patient Name: Lindsey Collins DOB: 07/28/1946 MRN: [id number]   Date of Service  03/24/2021  HPI/Events of Note  Patient with large liquid stools x 3. Bedside RN requesting order for Flexiseal.  eICU Interventions  Flexiseal ordered.        Kerry Kass Joanna Hall 03/24/2021, 11:05 PM

## 2021-03-24 NOTE — Progress Notes (Signed)
Patient transported from  [place] to 2H18 without incidence.

## 2021-03-24 NOTE — Progress Notes (Signed)
NAME:  Lindsey Collins, MRN:  [id number], DOB:  Dec 13, 1945, LOS: 1 ADMISSION DATE:  03/22/2021, CONSULTATION DATE:  03/22/2021 REFERRING MD:  Dr. Alvino Chapel, CHIEF COMPLAINT:  SBO with chronic Vent dependent    History of Present Illness:  Lindsey Collins is a 75 y.o. female from [place] with a PMH significant for acute on chronic respiratory failure with hypoxia now vent dependent through trach, CKD stage II, Parkinson's, and diabetes who presented from [facility] with reports of abdominal pain with distention and emesis. No longer vomiting, but still having abdominal pain.  Her breathing feels at baseline. Of note patient was recently treated at this facility 6/26 - 6/27 for similar presentation and treated with medical management with improvement and discharge back to [facility]. Admission in May 2022 for ARDS due to Serratia pneumonia.  On ED arrival patient was seen with temp of 99.3 and mild tachypnea with respiratory rate of 26 on vent through trach. Labwork significant for glucose 332, creatinine 1.27, WBC 11.5, HGB 11.7. CT ABD/pelvis was obtained and consistent with distal small bowel obstruction, left lower lobe opacity also seen. PCCM consulted to help manage chronic vent.  Pertinent  Medical History  Acute on chronic respiratory failure with hypoxia now vent dependent through trach, CKD stage II, Parkinson's, and diabetes  Significant Hospital Events:  7/9 admitted for recurrent small bowel obstruction; PEG to suction 7/11 awake, stable from respiratory standpoint  Interim History / Subjective:  3L fluid from PEG overnight, has now had bowel movements. Still having some pain.  Objective   Blood pressure (!) 137/57, pulse 86, temperature (!) 97.1 F (36.2 C), temperature source Axillary, resp. rate (!) 23, height 5\' 4"  (1.626 m), weight 91 kg, SpO2 99 %.    Vent Mode: PRVC FiO2 (%):  [28 %] 28 % Set Rate:  [26 bmp] 26 bmp Vt Set:  [500 mL] 500 mL PEEP:  [5 cmH20] 5 cmH20 Plateau  Pressure:  [24 cmH20-33 cmH20] 24 cmH20   Intake/Output Summary (Last 24 hours) at 03/24/2021 1012 Last data filed at 03/24/2021 0700 Gross per 24 hour  Intake 2429.37 ml  Output 1025 ml  Net 1404.37 ml   Filed Weights   03/22/21 1235  Weight: 91 kg    Examination: General: Obese, chronically ill-appearing female awake and in no acute distress HEENT: MM pink/moist, trach in place Neuro: Nods to questions appropriately and follows commands, no obvious focal deficits CV: s1s2 RRR, no m/r/g PULM: On full vent support, no elevated vent pressures on PEEP of 5, lungs clear bilaterally, some thick secretions suctioned GI: soft, bsx4 active, minimally tender to palpation Extremities: warm/dry, 1+ edema  Skin: no rashes or lesions  KUB after gastrograffin> still multiple dilated loops of bowel  Resolved Hospital Problem list     Assessment & Plan:     Acute small bowel obstruction, POA 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 -HOB elevated 30 degrees. -Plateau pressures less than 30 cm H20.  -Follow chest x-ray, ABG prn.   -Bronchial hygiene and RT/bronchodilator protocol. -Not requiring sedation,  no plans to attempt weaning as patient has chronic baseline respiratory failure -Vent settings have been stable, continue duo nebs every 6 -Likely need trach exchange before discharge       Likely LLL aspiration pneumonia-POA Blood cultures with no growth to date P: -Continuing Zosyn for 7-day  course to finish 7/16 -Has copious secretions, continue hypertonic saline nebs and add chest PT  -trach aspirate not collected & blood cultures pending> appear to have been collected after antibiotics were started -con't empiric pip-tazo; at this point cultures are unlikely to be helpful and would plan on her needing a 7-day course -hypertonic saline nebs to help with secretion management; may need CPT as well  AKI, POA, likely prerenal Creatinine up trended to 1.5 from 1.2 yesterday, labs pending for today 500 cc urine output P: -Suspect may be dry, primary team started IV fluids  -Monitor renal indices, urine output and electrolytes and avoid nephrotoxins as able   Hypophosphatemia hypokalemia -Replete as needed  Chronic anemia-  -transfuse for Hb<7 or hemodynamically significant bleeding  Best Practice  Diet/type: NPO Code Status:  full code Last date of multidisciplinary goals of care discussion Per Primary   Labs   CBC: Recent Labs  Lab 03/22/21 1255 03/23/21 0059  WBC 11.5* 11.6*  NEUTROABS  --  8.6*  HGB 11.7* 12.1  HCT 41.0 40.1  MCV 93.6 90.5  PLT 406* 438*     Basic Metabolic Panel: Recent Labs  Lab 03/22/21 0102 03/22/21 1255 03/23/21 0059  NA  --  139 140  K  --  4.5 3.3*  CL  --  97* 97*  CO2  --  28 27  GLUCOSE  --  332* 260*  BUN  --  37* 44*  CREATININE  --  1.27* 1.55*  CALCIUM  --  10.0 10.4*  MG 2.6*  --  2.5*  PHOS 1.7*  --  1.8*    GFR: Estimated Creatinine Clearance: 34.8 mL/min (A) (by C-G formula based on SCr of 1.55 mg/dL (H)). Recent Labs  Lab 03/22/21 0102 03/22/21 1255 03/23/21 0059  PROCALCITON <0.10  --   --   WBC  --  11.5* 11.6*     Lindsey Collins R Lindsey Sylva, PA-C [facility] See Amion for pager If no response to pager , please call [phone] until 7pm After 7:00 pm call [place]  [phone]?[place]

## 2021-03-24 NOTE — Progress Notes (Signed)
Subjective: CC: Hx obtained both with patient writing on a sheet of paper and mouthing responses.   Had some abdominal pain yesterday that resolved this am. Some intermittent nausea yesterday that has also resolved this am. Only 100cc out from gastric tube on LIWS since yesterday per RN. She is passing flatus and patient reports that she had a small bm yesterday (this is also on I/O).   Notes prior hx of ex-lap colectomy and colostomy for diverticulitis followed by a reversal. She has also had a g-tube placed. No other abdominal surgeries.   Objective: Vital signs in last 24 hours: Temp:  [97.7 F (36.5 C)-98.7 F (37.1 C)] 97.7 F (36.5 C) (07/11 0413) Pulse Rate:  [73-92] 80 (07/11 0715) Resp:  [13-26] 19 (07/11 0715) BP: (84-143)/(34-79) 137/57 (07/11 0715) SpO2:  [94 %-100 %] 97 % (07/11 0715) FiO2 (%):  [28 %] 28 % (07/11 0413) Last BM Date:  (currently has SBO--last BM unknown)  Intake/Output from previous day: 07/10 0701 - 07/11 0700 In: 2429.4 [I.V.:1868.1; IV Piggyback:561.3] Out: 1025 [Urine:500] Intake/Output this shift: No intake/output data recorded.  PE: Gen: Chronically ill patient on vent Lungs: On vent Heart: Reg Abd: Soft, mild distension, very minimal tenderness of the right abdomen without peritonitis. Incisional hernia reduced when patient lying flat. +BS. Prior midline and colostomy scar well healed. G-tube present on LIWS with minimal bilious output in cannister.   Lab Results:  Recent Labs    03/22/21 1255 03/23/21 0059  WBC 11.5* 11.6*  HGB 11.7* 12.1  HCT 41.0 40.1  PLT 406* 438*   BMET Recent Labs    03/22/21 1255 03/23/21 0059  NA 139 140  K 4.5 3.3*  CL 97* 97*  CO2 28 27  GLUCOSE 332* 260*  BUN 37* 44*  CREATININE 1.27* 1.55*  CALCIUM 10.0 10.4*   PT/INR No results for input(s): LABPROT, INR in the last 72 hours. CMP     Component Value Date/Time   NA 140 03/23/2021 0059   K 3.3 (L) 03/23/2021 0059   CL 97 (L)  03/23/2021 0059   CO2 27 03/23/2021 0059   GLUCOSE 260 (H) 03/23/2021 0059   BUN 44 (H) 03/23/2021 0059   CREATININE 1.55 (H) 03/23/2021 0059   CALCIUM 10.4 (H) 03/23/2021 0059   PROT 8.3 (H) 03/22/2021 1255   ALBUMIN 3.6 03/22/2021 1255   AST 19 03/22/2021 1255   ALT 12 03/22/2021 1255   ALKPHOS 56 03/22/2021 1255   BILITOT 0.7 03/22/2021 1255   GFRNONAA 35 (L) 03/23/2021 0059   Lipase     Component Value Date/Time   LIPASE 24 03/22/2021 1255       Studies/Results: CT Abdomen Pelvis Wo Contrast  Result Date: 03/22/2021 CLINICAL DATA:  Bowel obstruction suspected EXAM: CT ABDOMEN AND PELVIS WITHOUT CONTRAST TECHNIQUE: Multidetector CT imaging of the abdomen and pelvis was performed following the standard protocol without IV contrast. COMPARISON:  03/08/2021 FINDINGS: Lower chest: Airspace opacity with air bronchograms again noted in the left lower lobe, stable since prior study. No acute findings Hepatobiliary: No focal hepatic abnormality. Gallbladder unremarkable. Pancreas: No focal abnormality or ductal dilatation. Spleen: No focal abnormality.  Normal size. Adrenals/Urinary Tract: No adrenal abnormality. No focal renal abnormality. No stones or hydronephrosis. Urinary bladder is unremarkable. Stomach/Bowel: Dilated small bowel loops in the abdomen and pelvis. Distal small bowel loops are decompressed. Findings compatible with distal small bowel obstruction. Exact transition not visualized. Postoperative changes in the sigmoid colon. Gastrostomy  tube within the stomach. Vascular/Lymphatic: Aortic atherosclerosis. No evidence of aneurysm or adenopathy. Reproductive: Uterus and adnexa unremarkable.  No mass. Other: No free fluid or free air. Musculoskeletal: No acute bony abnormality. IMPRESSION: Dilated fluid-filled small bowel loops with air-fluid levels into the pelvis. Distal small bowel is decompressed. Findings compatible with distal small bowel obstruction. Aortic atherosclerosis.  Chronic opacity in the left lower lobe, likely scarring. Electronically Signed   By: Rolm Baptise M.D.   On: 03/22/2021 16:39   DG Abd Portable 1V-Small Bowel Obstruction Protocol-initial, 8 hr delay  Result Date: 03/23/2021 CLINICAL DATA:  Small-bowel obstruction.  8 hour delay. EXAM: PORTABLE ABDOMEN - 1 VIEW COMPARISON:  Plain film of the abdomen dated 03/22/2021 at 9:40 p.m. CT abdomen dated 03/22/2021. FINDINGS: Continued small bowel dilatation without appreciable change. There is seen within the decompressed RIGHT colon. The presumed oral contrast material within the distended small bowel of the LEFT upper quadrant. No appreciable contrast within the more distal small bowel. IMPRESSION: Continued evidence of small bowel obstruction. Presumed oral contrast material within the distended small bowel of the LEFT upper quadrant. No appreciable contrast passage into the more distal small bowel. Electronically Signed   By: Franki Cabot M.D.   On: 03/23/2021 07:59   DG Abd Portable 1V-Small Bowel Protocol-Position Verification  Result Date: 03/22/2021 CLINICAL DATA:  Abdominal pain, confirmed nasogastric placement. EXAM: PORTABLE ABDOMEN - 1 VIEW COMPARISON:  Same day CT abdomen pelvis. FINDINGS: No radiopaque nasogastric tube visualized. Left upper quadrant percutaneous gastrostomy tube. Similar dilated loops of small bowel, consistent with small bowel obstruction. IMPRESSION: 1. No radiopaque nasogastric tube visualized. 2. Similar dilated loops of small bowel, consistent with small bowel obstruction. Electronically Signed   By: Dahlia Bailiff MD   On: 03/22/2021 22:01    Anti-infectives: Anti-infectives (From admission, onward)    Start     Dose/Rate Route Frequency Ordered Stop   03/22/21 1915  piperacillin-tazobactam (ZOSYN) IVPB 3.375 g        3.375 g 12.5 mL/hr over 240 Minutes Intravenous Every 8 hours 03/22/21 1904          Assessment/Plan Recurrent SBO - Patient reports hx of prior hx  of ex-lap colectomy and colostomy for diverticulitis followed by a reversal. She has also had a g-tube placed. No other abdominal surgeries.  - Initially seen on 6/27 for this and resolved with conservative tx. Represented on 7/9. CT w/ distal sbo - SBO protocol initiated. Xray pending this am - Patient abdominal pain and nausea have resolved this am. She has only had 100cc/24 hours from g-tube on liws. Began passing flatus and had a small bm yesterday. Minimal tenderness on exam with normoactive bowel sounds. Appears to be resolving clinically. Clamp G-tube. Likely start TF's this afternoon if tolerates.   Incisional/ventral hernia - reducible   FEN - NPO, clamp g-tube  VTE - SCDs, per [facility] ID - Zosyn per [facility]  - Per Primary -  LLL PNA AKI Parkinson A. Fib Vent dependent    LOS: 1 day    Jillyn Ledger , [facility] 03/24/2021, 7:58 AM Please see Amion for pager number during day hours 7:00am-4:30pm

## 2021-03-24 NOTE — Progress Notes (Signed)
Initial Nutrition Assessment  DOCUMENTATION CODES:   Not applicable  INTERVENTION:   Tube feeding:  -Vital 1.5 @ 20 ml/hr via PEG -Advance to goal of 50 ml/hr per surgery (1200 ml) -ProSource TF 45 ml BID  Provides: 1880 kcals, 103 grams protein, 917 ml free water.   NUTRITION DIAGNOSIS:   Increased nutrient needs related to wound healing as evidenced by estimated needs.  GOAL:   Patient will meet greater than or equal to 90% of their needs  MONITOR:   Skin, TF tolerance, Weight trends, Labs, I & O's  REASON FOR ASSESSMENT:   Consult, Ventilator Enteral/tube feeding initiation and management  ASSESSMENT:   Patient with PMH significant for acute on chronic respiratory filure with hypoxia, vent dependent via trach, CKD II, Parkinson's disease, DM,  ex-lap colectomy and colostomy for diverticulitis, and recent admission for ARDS due to serratia PNA. Presents this admission with recurrent SBO.  Denies nausea/vomiting. Had 100 ml out of PEG x 24 hrs. Okay to start trickles per surgery. Unsure what patient was on at [facility] as home medications do not reflect any tube feeding orders. Will start with Vital product and transition to standard formula once tolerating.   Patient endorses a UBW of 200 lb and is unsure of recent weight loss. Records lack weight history over the last year (last two readings look to be stated).   UOP: 500 ml x 24 hrs   Drips: LR @ 125 ml/hr  Medications: SS novolog, lantus Labs: K 2.5 (L) CBG 103-133  NUTRITION - FOCUSED PHYSICAL EXAM:  Flowsheet Row Most Recent Value  Orbital Region No depletion  Upper Arm Region No depletion  Thoracic and Lumbar Region No depletion  Buccal Region No depletion  Temple Region No depletion  Clavicle Bone Region No depletion  Clavicle and Acromion Bone Region No depletion  Scapular Bone Region No depletion  Dorsal Hand No depletion  Patellar Region No depletion  Anterior Thigh Region No depletion  Posterior  Calf Region No depletion  Edema (RD Assessment) Mild  Hair Reviewed  Eyes Reviewed  Mouth Reviewed  Skin Reviewed  Nails Reviewed      Diet Order:   Diet Order             Diet NPO time specified  Diet effective now                   EDUCATION NEEDS:   Not appropriate for education at this time  Skin:  Skin Assessment: Skin Integrity Issues: Skin Integrity Issues:: Stage I Stage I: coccyx  Last BM:  7/11- small BM  Height:   Ht Readings from Last 1 Encounters:  03/22/21 5\' 4"  (1.626 m)    Weight:   Wt Readings from Last 1 Encounters:  03/22/21 91 kg    BMI:  Body mass index is 34.44 kg/m.  Estimated Nutritional Needs:   Kcal:  1800-2000 kcal  Protein:  90-105 grams  Fluid:  >/= 1.8 L/day  Mariana Single MS, RD, LDN, CNSC Clinical Nutrition Pager listed in [place]

## 2021-03-25 DIAGNOSIS — K56609 Unspecified intestinal obstruction, unspecified as to partial versus complete obstruction: Secondary | ICD-10-CM | POA: Diagnosis not present

## 2021-03-25 LAB — GLUCOSE, CAPILLARY
Glucose-Capillary: 125 mg/dL — ABNORMAL HIGH (ref 70–99)
Glucose-Capillary: 133 mg/dL — ABNORMAL HIGH (ref 70–99)
Glucose-Capillary: 149 mg/dL — ABNORMAL HIGH (ref 70–99)
Glucose-Capillary: 159 mg/dL — ABNORMAL HIGH (ref 70–99)
Glucose-Capillary: 159 mg/dL — ABNORMAL HIGH (ref 70–99)
Glucose-Capillary: 161 mg/dL — ABNORMAL HIGH (ref 70–99)
Glucose-Capillary: 180 mg/dL — ABNORMAL HIGH (ref 70–99)
Glucose-Capillary: 183 mg/dL — ABNORMAL HIGH (ref 70–99)
Glucose-Capillary: 189 mg/dL — ABNORMAL HIGH (ref 70–99)
Glucose-Capillary: 199 mg/dL — ABNORMAL HIGH (ref 70–99)
Glucose-Capillary: 64 mg/dL — ABNORMAL LOW (ref 70–99)

## 2021-03-25 LAB — MAGNESIUM
Magnesium: 2.4 mg/dL (ref 1.7–2.4)
Magnesium: 2.3 mg/dL (ref 1.7–2.4)

## 2021-03-25 LAB — BASIC METABOLIC PANEL
Anion gap: 10 (ref 5–15)
BUN: 24 mg/dL — ABNORMAL HIGH (ref 8–23)
CO2: 23 mmol/L (ref 22–32)
Calcium: 8.2 mg/dL — ABNORMAL LOW (ref 8.9–10.3)
Chloride: 104 mmol/L (ref 98–111)
Creatinine, Ser: 1.19 mg/dL — ABNORMAL HIGH (ref 0.44–1.00)
GFR, Estimated: 48 mL/min — ABNORMAL LOW (ref >60)
Glucose, Bld: 162 mg/dL — ABNORMAL HIGH (ref 70–99)
Potassium: 3 mmol/L — ABNORMAL LOW (ref 3.5–5.1)
Sodium: 137 mmol/L (ref 135–145)

## 2021-03-25 LAB — PHOSPHORUS
Phosphorus: 3.7 mg/dL (ref 2.5–4.6)
Phosphorus: 3.1 mg/dL (ref 2.5–4.6)

## 2021-03-25 MED ORDER — POTASSIUM CHLORIDE 10 MEQ/100ML IV SOLN
10.0000 meq | INTRAVENOUS | Status: AC
  Administered 2021-03-25 (×3): 10 meq via INTRAVENOUS
  Filled 2021-03-25 (×3): qty 100

## 2021-03-25 MED ORDER — VITAL 1.5 CAL PO LIQD
1000.0000 mL | ORAL | Status: DC
  Administered 2021-03-25: 1000 mL

## 2021-03-25 MED ORDER — POTASSIUM CHLORIDE 10 MEQ/100ML IV SOLN
INTRAVENOUS | Status: AC
  Administered 2021-03-25: 10 meq via INTRAVENOUS
  Filled 2021-03-25: qty 100

## 2021-03-25 MED ORDER — SODIUM CHLORIDE 0.9 % IV SOLN
2.0000 g | Freq: Two times a day (BID) | INTRAVENOUS | Status: DC
  Administered 2021-03-25 – 2021-03-26 (×3): 2 g via INTRAVENOUS
  Filled 2021-03-25 (×3): qty 2

## 2021-03-25 MED ORDER — POTASSIUM CHLORIDE 20 MEQ PO PACK
20.0000 meq | PACK | ORAL | Status: AC
  Administered 2021-03-25 (×2): 20 meq
  Filled 2021-03-25 (×2): qty 1

## 2021-03-25 NOTE — Progress Notes (Signed)
Subjective: CC: Doing well. Tolerating TF's without abdominal pain or emesis. Multiple BM's yesterday and this AM.   Objective: Vital signs in last 24 hours: Temp:  [98 F (36.7 C)-99.4 F (37.4 C)] 98 F (36.7 C) (07/12 0848) Pulse Rate:  [69-84] 75 (07/12 0835) Resp:  [21-29] 26 (07/12 0835) BP: (99-135)/(45-101) 106/60 (07/12 0835) SpO2:  [94 %-100 %] 99 % (07/12 0835) FiO2 (%):  [28 %-58 %] 28 % (07/12 0835) Weight:  [87 kg] 87 kg (07/12 0500) Last BM Date: 03/24/21  Intake/Output from previous day: 07/11 0701 - 07/12 0700 In: 2963.8 [I.V.:1442.9; NG/GT:224; IV Piggyback:1296.9] Out: 625 [Urine:625] Intake/Output this shift: No intake/output data recorded.  PE: Gen: Chronically ill patient on vent Lungs: On vent Heart: Reg Abd: Soft, protuberant, ND, NT, no peritonitis. Incisional hernia reduced when patient lying flat. +BS. Prior midline and colostomy scar well healed. G-tube present w/ TF's running at 20cc/hr.   Lab Results:  Recent Labs    03/23/21 0059 03/24/21 1055  WBC 11.6* 9.0  HGB 12.1 9.2*  HCT 40.1 30.1*  PLT 438* 331   BMET Recent Labs    03/24/21 1055 03/25/21 0516  NA 140 137  K 2.5* 3.0*  CL 102 104  CO2 27 23  GLUCOSE 130* 162*  BUN 36* 24*  CREATININE 1.47* 1.19*  CALCIUM 8.6* 8.2*   PT/INR No results for input(s): LABPROT, INR in the last 72 hours. CMP     Component Value Date/Time   NA 137 03/25/2021 0516   K 3.0 (L) 03/25/2021 0516   CL 104 03/25/2021 0516   CO2 23 03/25/2021 0516   GLUCOSE 162 (H) 03/25/2021 0516   BUN 24 (H) 03/25/2021 0516   CREATININE 1.19 (H) 03/25/2021 0516   CALCIUM 8.2 (L) 03/25/2021 0516   PROT 8.3 (H) 03/22/2021 1255   ALBUMIN 3.6 03/22/2021 1255   AST 19 03/22/2021 1255   ALT 12 03/22/2021 1255   ALKPHOS 56 03/22/2021 1255   BILITOT 0.7 03/22/2021 1255   GFRNONAA 48 (L) 03/25/2021 0516   Lipase     Component Value Date/Time   LIPASE 24 03/22/2021 1255        Studies/Results: DG Abd Portable 1V  Result Date: 03/24/2021 CLINICAL DATA:  Small-bowel obstruction follow-up EXAM: PORTABLE ABDOMEN - 1 VIEW COMPARISON:  03/23/2021 FINDINGS: Improved distension of the small bowel and colon, now with gas-filled, although nondistended loops of bowel throughout the abdomen, and gas present to the distal colon. No free air on supine radiographs. IMPRESSION: Improved distension of the small bowel and colon, now with gas-filled, although nondistended loops of bowel throughout the abdomen, and gas present to the distal colon. Electronically Signed   By: Eddie Candle M.D.   On: 03/24/2021 08:29    Anti-infectives: Anti-infectives (From admission, onward)    Start     Dose/Rate Route Frequency Ordered Stop   03/22/21 1915  piperacillin-tazobactam (ZOSYN) IVPB 3.375 g        3.375 g 12.5 mL/hr over 240 Minutes Intravenous Every 8 hours 03/22/21 1904          Assessment/Plan Recurrent SBO - Patient reports hx of prior hx of ex-lap colectomy and colostomy for diverticulitis followed by a reversal. She has also had a g-tube placed. No other abdominal surgeries. - Initially seen on 6/27 for this and resolved with conservative tx. Represented on 7/9. CT w/ distal sbo - Patient appears to have clinically resolved SBO. Tolerating TF's without abdominal  pain or emesis. ND/NT on exam with normoactive bowel sounds. Had multiple BM's yesterday and this AM. Will adv TF's to goal. Spoke with RN and CCM about this. Our team will be available as needed moving forward.    Incisional/ventral hernia - reducible   FEN - TF's VTE - SCDs, per CCM ID - Zosyn per CCM   - Per Primary - LLL PNA AKI Parkinson A. Fib Vent dependent    LOS: 2 days    Jillyn Ledger , [facility] 03/25/2021, 9:53 AM Please see Amion for pager number during day hours 7:00am-4:30pm

## 2021-03-25 NOTE — Progress Notes (Signed)
Brief Nutrition Follow Up  Consult received to advance tube feeding per surgery.   Patient currently tolerating Vital 1.5 @ 20 ml/hr. Having multiple bowel movements. Denies abdominal pain, nausea, or vomiting.   Tube feeding:  -Vital 1.5 @ 30 ml/hr via PEG -Increase by 10 ml Q6 hours to goal rate of 50 ml/hr (1200 ml) -ProSource TF 45 ml BID  Provides: 1880 kcals, 103 grams protein, 917 ml free water.   Mariana Single MS, RD, LDN, CNSC Clinical Nutrition Pager listed in [place]

## 2021-03-25 NOTE — Progress Notes (Addendum)
NAME:  Lindsey Collins, MRN:  [id number], DOB:  19-Mar-1946, LOS: 2 ADMISSION DATE:  03/22/2021, CONSULTATION DATE:  03/22/2021 REFERRING MD:  Dr. Alvino Chapel, CHIEF COMPLAINT:  SBO with chronic Vent dependent    History of Present Illness:  Lindsey Collins is a 75 y.o. female from [place] with a PMH significant for acute on chronic respiratory failure with hypoxia now vent dependent through trach, CKD stage II, Parkinson's, and diabetes who presented from [facility] with reports of abdominal pain with distention and emesis. No longer vomiting, but still having abdominal pain.  Her breathing feels at baseline. Of note patient was recently treated at this facility 6/26 - 6/27 for similar presentation and treated with medical management with improvement and discharge back to [facility]. Admission in May 2022 for ARDS due to Serratia pneumonia.  On ED arrival patient was seen with temp of 99.3 and mild tachypnea with respiratory rate of 26 on vent through trach. Labwork significant for glucose 332, creatinine 1.27, WBC 11.5, HGB 11.7. CT ABD/pelvis was obtained and consistent with distal small bowel obstruction, left lower lobe opacity also seen. PCCM consulted to help manage chronic vent.  Pertinent  Medical History  Acute on chronic respiratory failure with hypoxia now vent dependent through trach, CKD stage II, Parkinson's, and diabetes  Significant Hospital Events:  7/9 admitted for recurrent small bowel obstruction; PEG to suction 7/11 awake, stable from respiratory standpoint Cultures  7/10 Blood>> GS with GRAM POSITIVE RODS  CULTURE REINCUBATED FOR BETTER GROWTH 7/9 tracheal Aspirate >> SERRATIA MARCESCENS ( resistant to Cipro and Cefazolin) Interim History / Subjective:  3 liquid stools overnight  Flexi seal ordered, but not placed overnight Per nursing secretions are not as big an issue today Pt states she is comfortable at present and she is resting.  K was 3.0, orders have been placed to  replete Net + 2743 WBC 9, T max 99.4 K of 3, creatinine 1.19 Phos 3.7, Mag 2.4 Surgery plan to increase TF to goal, as they feel obstruction has resolved.   Objective   Blood pressure 106/60, pulse 75, temperature 98 F (36.7 C), temperature source Oral, resp. rate (!) 26, height 5\' 4"  (1.626 m), weight 87 kg, SpO2 99 %.    Vent Mode: PRVC FiO2 (%):  [28 %-58 %] 28 % Set Rate:  [26 bmp] 26 bmp Vt Set:  [500 mL] 500 mL PEEP:  [5 cmH20] 5 cmH20 Plateau Pressure:  [22 cmH20-26 cmH20] 26 cmH20   Intake/Output Summary (Last 24 hours) at 03/25/2021 0850 Last data filed at 03/25/2021 3419 Gross per 24 hour  Intake 2774.16 ml  Output 625 ml  Net 2149.16 ml   Filed Weights   03/22/21 1235 03/25/21 0500  Weight: 91 kg 87 kg    Examination: General: Obese, chronically ill-appearing female awake and in no acute distress, pleasant HEENT: MM pink/moist, trach is secure and intact Neuro: Nods to questions appropriately and follows commands, no obvious focal deficits CV: s1s2 RRR, no m/r/g PULM: On full vent support, no elevated vent pressures on PEEP of 5, lungs with rhonchi, exp wheeze,  some thick secretions suctioned GI: soft, bsx4 active, minimally tender to palpation, liquid stools Extremities: warm/dry, 1+ edema  Skin: no rashes or lesions    Resolved Hospital Problem list     Assessment & Plan:   Acute small bowel obstruction, POA History of colectomy and colostomy for diverticulitis and reversal P: -Management per general surgery -Tolerating TF at 20 cc per hour - 3  liquid stools overnight 7/12 -Plan to continue medical management and conservative treatment -Surgery may advance TF after evaluation   Acute on chronic respiratory failure with hypoxia,  POA Vent dependent with chronic tracheostomy, POA COPD-no acutely exacerbated -6 cuffed XLT Shiley trach  Currently on 28% per vent P: -Maintain full vent support with LTVV 4-6 cc/KG -titrate Vent setting to  maintain SpO2 greater than or equal to 90%. -HOB elevated 30 degrees. -Plateau pressures less than 30 cm H20.  -Follow chest x-ray, ABG prn.   -Bronchial hygiene and RT/bronchodilator protocol. -Not requiring sedation, no plans to attempt weaning as patient has chronic baseline respiratory failure -Vent settings have been stable, continue duo nebs every 6 -Likely need trach exchange before discharge   Likely LLL aspiration pneumonia-POA Blood cultures with gram - rods, culture pending 7/9 tracheal Aspirate >> SERRATIA MARCESCENS P: -Continuing Zosyn for 7-day course to finish 7/16 -Has copious secretions, continue hypertonic saline nebs and add chest PT -trach aspirate not collected & blood cultures pending> appear to have been collected after antibiotics were started -con't empiric pip-tazo; at this point cultures are unlikely to be helpful and would plan on her needing a 7-day course -hypertonic saline nebs to help with secretion management; may need CPT as well  AKI, POA, likely prerenal Creatinine down trending to 1.19 629 of UO overnight P: -Suspect may be dry, ( Diarrhea) primary team started IV fluids  -Monitor renal indices, urine output and electrolytes and avoid nephrotoxins as able   Hypophosphatemia hypokalemia -Replete as needed  Chronic anemia-  -transfuse for Hb<7 or hemodynamically significant bleeding - monitor for bleeding  - trend CBC  Best Practice  Diet/type: NPO>> Tube feeds at trickle Code Status:  full code Last date of multidisciplinary goals of care discussion Per Primary   Labs   CBC: Recent Labs  Lab 03/22/21 1255 03/23/21 0059 03/24/21 1055  WBC 11.5* 11.6* 9.0  NEUTROABS  --  8.6*  --   HGB 11.7* 12.1 9.2*  HCT 41.0 40.1 30.1*  MCV 93.6 90.5 87.8  PLT 406* 438* 211    Basic Metabolic Panel: Recent Labs  Lab 03/22/21 0102 03/22/21 1255 03/23/21 0059 03/24/21 1055 03/24/21 1628 03/25/21 0516  NA  --  139 140 140  --  137   K  --  4.5 3.3* 2.5*  --  3.0*  CL  --  97* 97* 102  --  104  CO2  --  28 27 27   --  23  GLUCOSE  --  332* 260* 130*  --  162*  BUN  --  37* 44* 36*  --  24*  CREATININE  --  1.27* 1.55* 1.47*  --  1.19*  CALCIUM  --  10.0 10.4* 8.6*  --  8.2*  MG 2.6*  --  2.5*  --  3.1* 2.4  PHOS 1.7*  --  1.8*  --  3.4 3.7   GFR: Estimated Creatinine Clearance: 44.3 mL/min (A) (by C-G formula based on SCr of 1.19 mg/dL (H)). Recent Labs  Lab 03/22/21 0102 03/22/21 1255 03/23/21 0059 03/24/21 1055  PROCALCITON <0.10  --   --   --   WBC  --  11.5* 11.6* 9.0   PCCM APP time 35 minutes  Magdalen Spatz, AGACNP=BC [place] See Amion for pager If no response to pager , please call [phone] until [facility] Use only After 7:00 pm call Elink  [phone]?[facility] use only 03/25/2021 8:50 AM

## 2021-03-25 NOTE — Progress Notes (Signed)
K+ 3.0 °Replaced per protocol ° °

## 2021-03-26 ENCOUNTER — Inpatient Hospital Stay (HOSPITAL_COMMUNITY): Payer: Medicare Other

## 2021-03-26 DIAGNOSIS — K56609 Unspecified intestinal obstruction, unspecified as to partial versus complete obstruction: Secondary | ICD-10-CM | POA: Diagnosis not present

## 2021-03-26 LAB — BASIC METABOLIC PANEL
Anion gap: 8 (ref 5–15)
BUN: 15 mg/dL (ref 8–23)
CO2: 19 mmol/L — ABNORMAL LOW (ref 22–32)
Calcium: 8.5 mg/dL — ABNORMAL LOW (ref 8.9–10.3)
Chloride: 111 mmol/L (ref 98–111)
Creatinine, Ser: 0.86 mg/dL (ref 0.44–1.00)
GFR, Estimated: >60 mL/min (ref >60)
Glucose, Bld: 193 mg/dL — ABNORMAL HIGH (ref 70–99)
Potassium: 3.7 mmol/L (ref 3.5–5.1)
Sodium: 138 mmol/L (ref 135–145)

## 2021-03-26 LAB — CULTURE, RESPIRATORY W GRAM STAIN

## 2021-03-26 LAB — CBC
HCT: 30.3 % — ABNORMAL LOW (ref 36.0–46.0)
Hemoglobin: 8.9 g/dL — ABNORMAL LOW (ref 12.0–15.0)
MCH: 26.6 pg (ref 26.0–34.0)
MCHC: 29.4 g/dL — ABNORMAL LOW (ref 30.0–36.0)
MCV: 90.7 fL (ref 80.0–100.0)
Platelets: 280 10*3/uL (ref 150–400)
RBC: 3.34 MIL/uL — ABNORMAL LOW (ref 3.87–5.11)
RDW: 17 % — ABNORMAL HIGH (ref 11.5–15.5)
WBC: 8.2 10*3/uL (ref 4.0–10.5)
nRBC: 0 % (ref 0.0–0.2)

## 2021-03-26 LAB — GLUCOSE, CAPILLARY
Glucose-Capillary: 160 mg/dL — ABNORMAL HIGH (ref 70–99)
Glucose-Capillary: 204 mg/dL — ABNORMAL HIGH (ref 70–99)
Glucose-Capillary: 208 mg/dL — ABNORMAL HIGH (ref 70–99)
Glucose-Capillary: 215 mg/dL — ABNORMAL HIGH (ref 70–99)

## 2021-03-26 LAB — SARS CORONAVIRUS 2 (TAT 6-24 HRS): SARS Coronavirus 2: NEGATIVE

## 2021-03-26 LAB — MAGNESIUM: Magnesium: 2.1 mg/dL (ref 1.7–2.4)

## 2021-03-26 LAB — PHOSPHORUS: Phosphorus: 3 mg/dL (ref 2.5–4.6)

## 2021-03-26 MED ORDER — POTASSIUM CHLORIDE 20 MEQ PO PACK
40.0000 meq | PACK | Freq: Once | ORAL | Status: AC
  Administered 2021-03-26: 40 meq
  Filled 2021-03-26: qty 2

## 2021-03-26 MED ORDER — SODIUM CHLORIDE 0.9 % IV SOLN: 2.0000 g | Freq: Two times a day (BID) | INTRAVENOUS | 0 refills | Status: AC

## 2021-03-26 MED ORDER — INSULIN GLARGINE 100 UNIT/ML ~~LOC~~ SOLN: 30.0000 [IU] | Freq: Every day | SUBCUTANEOUS | 11 refills | Status: AC

## 2021-03-26 MED ORDER — CHLORHEXIDINE GLUCONATE CLOTH 2 % EX PADS: 6.0000 | MEDICATED_PAD | Freq: Every day | CUTANEOUS | Status: DC

## 2021-03-26 NOTE — Discharge Summary (Signed)
Physician Discharge Summary  Patient ID: Lindsey Collins MRN: [id number] DOB/AGE: 1945-09-15 75 y.o.  Admit date: 03/22/2021 Discharge date: 03/26/2021  Problem List Principal Problem:   Small bowel obstruction ([place]) Active Problems:   Status post tracheostomy ([place])   Parkinson's disease ([place])   Diabetes mellitus type 2, uncontrolled ([place])   Ventilator dependent ([place])   Pressure injury of skin  HPI:  Lindsey Collins is a 75 yo female with a PMHx of acute on chronic respiratory failure with hypoxia on vent with trach placement, CKD stage II, Parkinson's, type 2 diabetes, HFpEF. A limited history was obtained from the patient as she is non-verbal, but she is able to communicate through writing. Most of the history was obtained from chart review. Patient is presenting from [facility] after having episodes of abdominal pain with distention, nausea, and vomiting that began about 2 days ago. X-ray done at the facility concerning for small bowel obstruction. Of note, she was recently admitted from 6/26-6/27 with a similar presentation. At this visit, she was admitted to critical care and had PEG tube placed and hooked to suction. Patient was discharged after sxs resolved 1 day later. Today the patient is reporting her abdominal pain has returned, mostly limited to the right lower quadrant. She is nauseous and has had multiple episodes of emesis. Patient denies any hematemesis. She is also reporting loose stools, but denies any melena or hematochezia.   In the ED, patient had temperature of 99.3, tachypnea with resp rate of 26 on vent, and tachycardia with HR 105. Labwork significant for elevated glucose level of 332 on arrival, elevated Cr at 1.27 (last Cr 0.81 on 6/25), leukocytosis of 11.5, Hb 11.7, and platelets elevated at 406. CT abd/pelvis showing dilated fluid-filled small bowel loops with air-fluid levels into pelvis and distal small bowel decompressed, concerning for distal small bowel obstruction.  PCCM consulted for vent management, surgery consulted for SBO management, and IMTS consulted for admission.    Hospital Course: Significant Hospital Events:  7/9 admitted for recurrent small bowel obstruction; PEG to suction 7/11 awake, stable from respiratory standpoint Cultures 7/10 Blood>> GS with GRAM POSITIVE RODS  CULTURE REINCUBATED FOR BETTER GROWTH 7/9 tracheal Aspirate >> SERRATIA MARCESCENS ( resistant to Cipro and Cefazolin) Interim History / Subjective:  3 liquid stools overnight Flexi seal ordered, but not placed overnight Per nursing secretions are not as big an issue today Pt states she is comfortable at present and she is resting. K was 3.0, orders have been placed to replete Net + 2743 WBC 9, T max 99.4 K of 3, creatinine 1.19 Phos 3.7, Mag 2.4 Surgery plan to increase TF to goal, as they feel obstruction has resolved.  03/26/2021 plan to transfer to [facility] when bed available   Objective   Blood pressure (!) 128/53, pulse 75, temperature 98.5 F (36.9 C), temperature source Oral, resp. rate (!) 26, height '5\' 4"'  (1.626 m), weight 89.5 kg, SpO2 99 %.     >  Vent Mode: PRVC FiO2 (%):  [28 %] 28 % Set Rate:  [26 bmp] 26 bmp Vt Set:  [500 mL] 500 mL PEEP:  [5 cmH20] 5 cmH20 Plateau Pressure:  [13 cmH20-28 cmH20] 28 cmH20      Intake/Output Summary (Last 24 hours) at 03/26/2021 0909 Last data filed at 03/26/2021 0900    Gross per 24 hour  Intake 4013.83 ml  Output 900 ml  Net 3113.83 ml         [employer]  03/22/21 1235 03/25/21 0500 03/26/21 0500  Weight: 91 kg 87 kg 89.5 kg      Examination: General: Obese female on full vent support without any obvious distress HEENT: MM pink/moist Shiley trach is unremarkable Neuro: Grossly intact without focal defect CV: Heart sounds are distant PULM: Decreased breath sounds in the bases Vent pressure regulated volume control FIO2 28% PEEP 5 RATE 26 VT 500   GI: soft, bsx4 active GU: Extremities:  warm/dry,  edema Skin: no rashes or lesions         Resolved Hospital Problem list       Assessment & Plan:    Acute small bowel obstruction, POA History of colectomy and colostomy for diverticulitis and reversal P: Managed per general surgery Ready for transfer back to [facility]     Acute on chronic respiratory failure with hypoxia,  POA Vent dependent with chronic tracheostomy, POA COPD-no acutely exacerbated -6 cuffed XLT Shiley trach Currently on 28% per vent P: Continue current support Intermittent chest x-ray Bronchodilator therapy No plans for weaning           Likely LLL aspiration pneumonia-POA Blood cultures with gram - rods, culture pending 7/9 tracheal Aspirate >> SERRATIA MARCESCENS P: -Continue cefepime for 7 total doses ending on 03/29/2021 -Pulmonary: Toilet   AKI, Labs (Brief)       Lab Results  Component Value Date    CREATININE 0.86 03/26/2021    CREATININE 1.19 (H) 03/25/2021    CREATININE 1.47 (H) 03/24/2021          P: Resolved     Hypophosphatemia Hypokalemia Last Labs        Recent Labs  Lab 03/24/21 1055 03/25/21 0516 03/26/21 0607  K 2.5* 3.0* 3.7        Monitor replete as needed   Chronic anemia-  Recent Labs (last 2 labs)       Recent Labs    03/24/21 1055 03/26/21 0607  HGB 9.2* 8.9*        Transfuse per protocol      Labs at discharge Lab Results  Component Value Date   CREATININE 0.86 03/26/2021   BUN 15 03/26/2021   NA 138 03/26/2021   K 3.7 03/26/2021   CL 111 03/26/2021   CO2 19 (L) 03/26/2021   Lab Results  Component Value Date   WBC 8.2 03/26/2021   HGB 8.9 (L) 03/26/2021   HCT 30.3 (L) 03/26/2021   MCV 90.7 03/26/2021   PLT 280 03/26/2021   Lab Results  Component Value Date   ALT 12 03/22/2021   AST 19 03/22/2021   ALKPHOS 56 03/22/2021   BILITOT 0.7 03/22/2021   Lab Results  Component Value Date   INR 1.2 01/07/2021    Current radiology studies DG CHEST  PORT 1 VIEW  Result Date: 03/26/2021 CLINICAL DATA:  Shortness of breath, respiratory failure EXAM: PORTABLE CHEST 1 VIEW COMPARISON:  03/08/2021 FINDINGS: Tracheostomy remains in place, unchanged. No confluent airspace opacities or effusions. Heart is normal size. Aortic atherosclerosis. IMPRESSION: No active disease. Electronically Signed   By: Rolm Baptise M.D.   On: 03/26/2021 09:01    Disposition:   There are no questions and answers to display.        Allergies as of 03/26/2021   No Known Allergies      Medication List     STOP taking these medications    amiodarone 200 MG tablet Commonly known as: PACERONE   furosemide 20 MG tablet  Commonly known as: LASIX   guaiFENesin 200 MG tablet   magnesium oxide 400 MG tablet Commonly known as: MAG-OX   memantine 5 MG tablet Commonly known as: NAMENDA   metoprolol tartrate 25 MG tablet Commonly known as: LOPRESSOR   NORMAL SALINE FLUSH IV   Ozempic (0.25 or 0.5 MG/DOSE) 2 MG/1.5ML Sopn Generic drug: Semaglutide(0.25 or 0.5MG/DOS)   Potassium Chloride 40 MEQ/15ML (20%) Soln       TAKE these medications    acetaminophen 325 MG tablet Commonly known as: TYLENOL Place 650 mg into feeding tube every 6 (six) hours as needed for moderate pain.   allopurinol 100 MG tablet Commonly known as: ZYLOPRIM Place 200 mg into feeding tube daily.   atorvastatin 20 MG tablet Commonly known as: LIPITOR Place 20 mg into feeding tube at bedtime.   carbidopa-levodopa 25-100 MG tablet Commonly known as: SINEMET IR Place 1 tablet into feeding tube every 6 (six) hours. Times given : 00:00, 0600; 12:00, 1800   ceFEPIme 2 g in sodium chloride 0.9 % 100 mL Inject 2 g into the vein every 12 (twelve) hours for 3 days.   chlorhexidine 0.12 % solution Commonly known as: PERIDEX Use as directed 15 mLs in the mouth or throat 2 (two) times daily.   cholecalciferol 25 MCG (1000 UNIT) tablet Commonly known as: VITAMIN D3 Place 1,000  Units into feeding tube daily.   cloNIDine 0.1 MG tablet Commonly known as: CATAPRES Place 0.1 mg into feeding tube every 8 (eight) hours as needed (SBP >180).   divalproex 125 MG capsule Commonly known as: DEPAKOTE SPRINKLE 500 mg 2 (two) times daily. Via tube   FLUoxetine 10 MG tablet Commonly known as: PROZAC Place 10 mg into feeding tube daily.   Glucagon Emergency 1 MG Kit Inject 1 mg as directed as needed (blood sugar less than 70).   insulin aspart 100 UNIT/ML injection Commonly known as: novoLOG Inject 0-10 Units into the skin every 6 (six) hours. Sliding scale: CBG 150 = 0 units, 151-200 = 2 units, 201-250 = 4 units, 251-300 = 6 units, 301-350 = 8 units, 351-400 = 10 units.   insulin glargine 100 UNIT/ML injection Commonly known as: LANTUS Inject 0.3 mLs (30 Units total) into the skin at bedtime. What changed: how much to take   ipratropium-albuterol 0.5-2.5 (3) MG/3ML Soln Commonly known as: DUONEB Take 3 mLs by nebulization every 6 (six) hours.   OLANZapine 10 MG tablet Commonly known as: ZYPREXA Place 10 mg into feeding tube at bedtime.   ondansetron 4 MG/2ML Soln injection Commonly known as: ZOFRAN Inject 4 mg into the vein every 6 (six) hours as needed for nausea or vomiting.   ondansetron 8 MG tablet Commonly known as: ZOFRAN Place 8 mg into feeding tube every 8 (eight) hours as needed for nausea or vomiting.   pantoprazole sodium 40 mg/20 mL Pack Commonly known as: PROTONIX Place 40 mg into feeding tube 2 (two) times daily.   polyethylene glycol 17 g packet Commonly known as: MIRALAX / GLYCOLAX Place 17 g into feeding tube daily.   pramipexole 0.125 MG tablet Commonly known as: MIRAPEX Place 0.125 mg into feeding tube every 8 (eight) hours.   propylthiouracil 50 MG tablet Commonly known as: PTU Place 50 mg into feeding tube every 8 (eight) hours.   senna-docusate 8.6-50 MG tablet Commonly known as: Senokot-S Place 2 tablets into feeding tube  at bedtime.          Discharged Condition: fair  Time  spent on discharge 40 minutes.  Vital signs at Discharge. Temp:  [98.2 F (36.8 C)-98.9 F (37.2 C)] 98.5 F (36.9 C) (07/13 0747) Pulse Rate:  [65-94] 94 (07/13 1000) Resp:  [17-27] 22 (07/13 1000) BP: (93-137)/(52-94) 137/94 (07/13 1000) SpO2:  [97 %-100 %] 99 % (07/13 1000) FiO2 (%):  [28 %] 28 % (07/13 0800) Weight:  [89.5 kg] 89.5 kg (07/13 0500) Office follow up Special Information or instructions.  No follow up with PCCM She has been discharged back to [facility] on 03/26/2021. Multiple medications have been stopped while in the hospital due to recent ileus.  She will need her MAR reviewed by [facility] and medications added back as tolerated.  She been discharged back to [facility] in improved condition no acute distress.  Signed: Richardson Landry Deyona Soza ACNP Acute Care Nurse Practitioner [employer] Please consult Amion 03/26/2021, 10:40 AM

## 2021-03-26 NOTE — Progress Notes (Signed)
[facility] at bedside to take patient back to [facility]. Report given to [facility] and RN at [place]. All pt belongings transferred with patient.

## 2021-03-26 NOTE — Evaluation (Signed)
[facility]y-[facility]ir Speaking Valve - Evaluation Patient Details  Name: Lindsey Collins MRN: [id number] Date of Birth: 04-Dec-1945  Today's Date: 03/26/2021 Time: 4680-3212 SLP Time Calculation (min) (ACUTE ONLY): 23 min  Past Medical History:  Past Medical History:  Diagnosis Date   Acute on chronic respiratory failure with hypoxia (HCC)    ARDS (adult respiratory distress syndrome) (HCC)    Chronic kidney disease, stage II (mild)    MRSA (methicillin resistant staphylococcus aureus) pneumonia (HCC)    Obstructive chronic bronchitis without exacerbation (HCC)    Past Surgical History:  Past Surgical History:  Procedure Laterality Date   TRACHEOSTOMY     HPI:  Pt is a 75 yo female with a h/o chronic respiratory failure requiring trach/vent, admitted with recurrent SBO and possible LLL PNA. Pt had recent admission in June 2022 with a similar presentation, at which time PEG was placed. PMH also includes: Parkinson's disease, CKD, DM   Assessment / Plan / Recommendation Clinical Impression  Pt is not sure if she uses a PMV at [facility], but per RN she has been using one inline with the vent. Pt was seen in conjunction with RT to attempt placement. With cuff deflation pt shows an adequate drop in VTe but pt is not able to move air through upper airway as PMV is placed. SpO2 quickly dropped as low as 77%, although also returned to 99% quickly when valve was doffed and cuff was reinflated. After reinflation, SLP and RT did still note audible phonation with pt able to vocalize at the sentence level. Her vocal quality sounded strong and her speech was intelligible. RT continued to make adjustments until evidence of leak was reduced. Although pt showed good phonation, she also showed signs of intolerance with inline PMV. Given that plan is to return to [facility] as early as today, will hold on any further placement until pt returns to [facility] where they may also know what she was doing with the PMV PTA. SLP Visit  Diagnosis: Aphonia (R49.1)    SLP Assessment  All further Speech Lanaguage Pathology  needs can be addressed in the next venue of care    Follow Up Recommendations  Other (comment) (returning to [facility])    Frequency and Duration         PMSV Trial PMSV was placed for: <1 minute Able to redirect subglottic air through upper airway: Yes (with PMV off) Able to Attain Phonation: Yes Voice Quality: Normal Able to Expectorate Secretions: Yes Level of Secretion Expectoration with PMSV: Oral Breath Support for Phonation: Adequate Intelligibility: Intelligible Respirations During Trial:  (high 20s) SpO2 During Trial:  (dropped to 77%, back up to 100% with valve off and cuff reinflated) Pulse During Trial:  (WNL) Behavior: Alert   Tracheostomy Tube       Vent Dependency  Vent Mode: PRVC Set Rate: 26 bmp PEEP: 5 cmH20 FiO2 (%): 28 % Vt Set: 500 mL    Cuff Deflation Trial  GO Tolerated Cuff Deflation: No Length of Time for Cuff Deflation Trial: 1-2 minutes Behavior: Alert        Osie Bond., M.A. [place] [facility] Pager [phone] Office [phone]  03/26/2021, 2:49 PM

## 2021-03-26 NOTE — TOC Transition Note (Signed)
Transition of Care [facility]) - CM/SW Discharge Note   Patient Details  Name: Lindsey Collins MRN: [id number] Date of Birth: [birth year]  [facility]) CM/SW Contact:  Trula Ore, [place] Phone Number: 03/26/2021, 1:11 PM   Clinical Narrative:     CSW spoke with Prem with [facility] who confirmed patient can return when medically ready. Per Prem at [facility] Patient does not need FL2 or PASSR to return . Prem confirmed covid test from 7/9 is good to return.CSW notified MD.  Patient will DC to: [facility]   Anticipated DC date: 03/26/2021  Family notified: Insurance claims handler by: Karen Chafe  ?  Per MD patient ready for DC to [facility]. RN, patient, patient's family, and facility notified of DC. Discharge Summary sent to facility. RN given number for report tele# [phone] RM# [phone]. Accepting physician is Dr. Barbie Haggis. DC packet on chart. [facility] transport requested for patient.  CSW signing off.     Barriers to Discharge: No Barriers Identified   Patient Goals and CMS Choice   CMS Medicare.gov Compare Post Acute Care list provided to:: Patient Represenative (must comment) Baxter Flattery Daughter) Choice offered to / list presented to : Adult Children Baxter Flattery daughter)  Discharge Placement                       Discharge Plan and Services In-house Referral: Clinical Social Work                                   Social Determinants of Health (SDOH) Interventions     Readmission Risk Interventions No flowsheet data found.

## 2021-03-26 NOTE — Progress Notes (Signed)
NAME:  Lindsey Collins, MRN:  [id number], DOB:  07-23-1946, LOS: 3 ADMISSION DATE:  03/22/2021, CONSULTATION DATE:  03/22/2021 REFERRING MD:  Dr. Alvino Chapel, CHIEF COMPLAINT:  SBO with chronic Vent dependent    History of Present Illness:  Lindsey Collins is a 75 y.o. female from [place] with a PMH significant for acute on chronic respiratory failure with hypoxia now vent dependent through trach, CKD stage II, Parkinson's, and diabetes who presented from [facility] with reports of abdominal pain with distention and emesis. No longer vomiting, but still having abdominal pain.  Her breathing feels at baseline. Of note patient was recently treated at this facility 6/26 - 6/27 for similar presentation and treated with medical management with improvement and discharge back to [facility]. Admission in May 2022 for ARDS due to Serratia pneumonia.  On ED arrival patient was seen with temp of 99.3 and mild tachypnea with respiratory rate of 26 on vent through trach. Labwork significant for glucose 332, creatinine 1.27, WBC 11.5, HGB 11.7. CT ABD/pelvis was obtained and consistent with distal small bowel obstruction, left lower lobe opacity also seen. PCCM consulted to help manage chronic vent.  Pertinent  Medical History  Acute on chronic respiratory failure with hypoxia now vent dependent through trach, CKD stage II, Parkinson's, and diabetes  Significant Hospital Events:  7/9 admitted for recurrent small bowel obstruction; PEG to suction 7/11 awake, stable from respiratory standpoint Cultures  7/10 Blood>> GS with GRAM POSITIVE RODS  CULTURE REINCUBATED FOR BETTER GROWTH 7/9 tracheal Aspirate >> SERRATIA MARCESCENS ( resistant to Cipro and Cefazolin) Interim History / Subjective:  3 liquid stools overnight  Flexi seal ordered, but not placed overnight Per nursing secretions are not as big an issue today Pt states she is comfortable at present and she is resting.  K was 3.0, orders have been placed to  replete Net + 2743 WBC 9, T max 99.4 K of 3, creatinine 1.19 Phos 3.7, Mag 2.4 Surgery plan to increase TF to goal, as they feel obstruction has resolved.  03/26/2021 plan to transfer to [facility] when bed available  Objective   Blood pressure (!) 128/53, pulse 75, temperature 98.5 F (36.9 C), temperature source Oral, resp. rate (!) 26, height 5\' 4"  (1.626 m), weight 89.5 kg, SpO2 99 %.    Vent Mode: PRVC FiO2 (%):  [28 %] 28 % Set Rate:  [26 bmp] 26 bmp Vt Set:  [500 mL] 500 mL PEEP:  [5 cmH20] 5 cmH20 Plateau Pressure:  [13 cmH20-28 cmH20] 28 cmH20   Intake/Output Summary (Last 24 hours) at 03/26/2021 9417 Last data filed at 03/26/2021 0900 Gross per 24 hour  Intake 4013.83 ml  Output 900 ml  Net 3113.83 ml   Filed Weights   03/22/21 1235 03/25/21 0500 03/26/21 0500  Weight: 91 kg 87 kg 89.5 kg    Examination: General: Obese female on full vent support without any obvious distress HEENT: MM pink/moist Shiley trach is unremarkable Neuro: Grossly intact without focal defect CV: Heart sounds are distant PULM: Decreased breath sounds in the bases Vent pressure regulated volume control FIO2 28% PEEP 5 RATE 26 VT 500  GI: soft, bsx4 active  GU: Extremities: warm/dry,  edema  Skin: no rashes or lesions     Resolved Hospital Problem list     Assessment & Plan:   Acute small bowel obstruction, POA History of colectomy and colostomy for diverticulitis and reversal P: Managed per general surgery Ready for transfer back to [facility]  Acute on chronic respiratory failure with hypoxia,  POA Vent dependent with chronic tracheostomy, POA COPD-no acutely exacerbated -6 cuffed XLT Shiley trach  Currently on 28% per vent P: Continue current support Intermittent chest x-ray Bronchodilator therapy No plans for weaning      Likely LLL aspiration pneumonia-POA Blood cultures with gram - rods, culture pending 7/9 tracheal Aspirate >> SERRATIA  MARCESCENS P: -Continue cefepime for 7 total doses ending on 03/29/2021 -Pulmonary: Toilet  AKI, Lab Results  Component Value Date   CREATININE 0.86 03/26/2021   CREATININE 1.19 (H) 03/25/2021   CREATININE 1.47 (H) 03/24/2021     P: Resolved   Hypophosphatemia Hypokalemia Recent Labs  Lab 03/24/21 1055 03/25/21 0516 03/26/21 0607  K 2.5* 3.0* 3.7    Monitor replete as needed  Chronic anemia-  Recent Labs    03/24/21 1055 03/26/21 0607  HGB 9.2* 8.9*    Transfuse per protocol  Best Practice  Diet/type: NPO>> Tube feeds at trickle Code Status:  full code Last date of multidisciplinary goals of care discussion Per Primary  03/26/2021 plan is to return to [facility] when bed available  Labs   CBC: Recent Labs  Lab 03/22/21 1255 03/23/21 0059 03/24/21 1055 03/26/21 0607  WBC 11.5* 11.6* 9.0 8.2  NEUTROABS  --  8.6*  --   --   HGB 11.7* 12.1 9.2* 8.9*  HCT 41.0 40.1 30.1* 30.3*  MCV 93.6 90.5 87.8 90.7  PLT 406* 438* 331 081    Basic Metabolic Panel: Recent Labs  Lab 03/22/21 1255 03/23/21 0059 03/24/21 1055 03/24/21 1628 03/25/21 0516 03/25/21 1641 03/26/21 0607  NA 139 140 140  --  137  --  138  K 4.5 3.3* 2.5*  --  3.0*  --  3.7  CL 97* 97* 102  --  104  --  111  CO2 28 27 27   --  23  --  19*  GLUCOSE 332* 260* 130*  --  162*  --  193*  BUN 37* 44* 36*  --  24*  --  15  CREATININE 1.27* 1.55* 1.47*  --  1.19*  --  0.86  CALCIUM 10.0 10.4* 8.6*  --  8.2*  --  8.5*  MG  --  2.5*  --  3.1* 2.4 2.3 2.1  PHOS  --  1.8*  --  3.4 3.7 3.1 3.0   GFR: Estimated Creatinine Clearance: 62.2 mL/min (by C-G formula based on SCr of 0.86 mg/dL). Recent Labs  Lab 03/22/21 0102 03/22/21 1255 03/23/21 0059 03/24/21 1055 03/26/21 0607  PROCALCITON <0.10  --   --   --   --   WBC  --  11.5* 11.6* 9.0 8.2   PCCM APP time 35 minutes  Richardson Landry Barbar Brede ACNP Acute Care Nurse Practitioner [place] Please consult  E[place] 03/26/2021, 9:10 AM

## 2021-03-27 LAB — CULTURE, BLOOD (ROUTINE X 2)
Culture: NO GROWTH
Special Requests: ADEQUATE
Special Requests: ADEQUATE

## 2021-07-09 ENCOUNTER — Other Ambulatory Visit: Payer: Self-pay

## 2021-07-09 ENCOUNTER — Emergency Department (HOSPITAL_COMMUNITY): Payer: Medicare Other

## 2021-07-09 ENCOUNTER — Emergency Department (HOSPITAL_COMMUNITY)
Admission: EM | Admit: 2021-07-09 | Discharge: 2021-07-10 | Disposition: A | Discharge status: Home / Self Care | Payer: Medicare Other | Attending: Emergency Medicine | Admitting: Emergency Medicine

## 2021-07-09 DIAGNOSIS — E1122 Type 2 diabetes mellitus with diabetic chronic kidney disease: Secondary | ICD-10-CM | POA: Insufficient documentation

## 2021-07-09 DIAGNOSIS — N182 Chronic kidney disease, stage 2 (mild): Secondary | ICD-10-CM | POA: Diagnosis not present

## 2021-07-09 DIAGNOSIS — Z794 Long term (current) use of insulin: Secondary | ICD-10-CM | POA: Insufficient documentation

## 2021-07-09 DIAGNOSIS — R21 Rash and other nonspecific skin eruption: Secondary | ICD-10-CM

## 2021-07-09 DIAGNOSIS — Z930 Tracheostomy status: Secondary | ICD-10-CM | POA: Insufficient documentation

## 2021-07-09 DIAGNOSIS — K5900 Constipation, unspecified: Secondary | ICD-10-CM | POA: Insufficient documentation

## 2021-07-09 DIAGNOSIS — G20 Parkinson's disease: Secondary | ICD-10-CM | POA: Diagnosis not present

## 2021-07-09 LAB — CBC WITH DIFFERENTIAL/PLATELET
Abs Immature Granulocytes: 0.04 10*3/uL (ref 0.00–0.07)
Basophils Absolute: 0 10*3/uL (ref 0.0–0.1)
Basophils Relative: 0 %
Eosinophils Absolute: 0.1 10*3/uL (ref 0.0–0.5)
Eosinophils Relative: 1 %
HCT: 39.1 % (ref 36.0–46.0)
Hemoglobin: 12.1 g/dL (ref 12.0–15.0)
Immature Granulocytes: 1 %
Lymphocytes Relative: 27 %
Lymphs Abs: 2.2 10*3/uL (ref 0.7–4.0)
MCH: 27.1 pg (ref 26.0–34.0)
MCHC: 30.9 g/dL (ref 30.0–36.0)
MCV: 87.7 fL (ref 80.0–100.0)
Monocytes Absolute: 0.6 10*3/uL (ref 0.1–1.0)
Monocytes Relative: 8 %
Neutro Abs: 5.1 10*3/uL (ref 1.7–7.7)
Neutrophils Relative %: 63 %
Platelets: 245 10*3/uL (ref 150–400)
RBC: 4.46 MIL/uL (ref 3.87–5.11)
RDW: 19.6 % — ABNORMAL HIGH (ref 11.5–15.5)
WBC: 8 10*3/uL (ref 4.0–10.5)
nRBC: 0 % (ref 0.0–0.2)

## 2021-07-09 LAB — COMPREHENSIVE METABOLIC PANEL
ALT: <5 U/L (ref 0–44)
AST: 18 U/L (ref 15–41)
Albumin: 3.4 g/dL — ABNORMAL LOW (ref 3.5–5.0)
Alkaline Phosphatase: 55 U/L (ref 38–126)
Anion gap: 12 (ref 5–15)
BUN: <5 mg/dL — ABNORMAL LOW (ref 8–23)
CO2: 22 mmol/L (ref 22–32)
Calcium: 9.2 mg/dL (ref 8.9–10.3)
Chloride: 102 mmol/L (ref 98–111)
Creatinine, Ser: 0.81 mg/dL (ref 0.44–1.00)
GFR, Estimated: >60 mL/min (ref >60)
Glucose, Bld: 97 mg/dL (ref 70–99)
Potassium: 4.2 mmol/L (ref 3.5–5.1)
Sodium: 136 mmol/L (ref 135–145)
Total Bilirubin: 1 mg/dL (ref 0.3–1.2)
Total Protein: 7.2 g/dL (ref 6.5–8.1)

## 2021-07-09 LAB — LACTIC ACID, PLASMA: Lactic Acid, Venous: 1.1 mmol/L (ref 0.5–1.9)

## 2021-07-09 LAB — PROTIME-INR
INR: 1.1 (ref 0.8–1.2)
Prothrombin Time: 14.3 seconds (ref 11.4–15.2)

## 2021-07-09 LAB — LIPASE, BLOOD: Lipase: 19 U/L (ref 11–51)

## 2021-07-09 MED ORDER — IOHEXOL 350 MG/ML SOLN
100.0000 mL | Freq: Once | INTRAVENOUS | Status: AC | PRN
  Administered 2021-07-09: 100 mL via INTRAVENOUS

## 2021-07-09 NOTE — ED Triage Notes (Signed)
Pt arrives to ED via [facility] from [facility]. SBO confirmed with xray and distention today. Constipation several days. Abd intermittent pain. Non-tender. Tracheostomy patient, possible shingles wounds to left lumbar area.

## 2021-07-09 NOTE — ED Provider Notes (Signed)
[facility] EMERGENCY DEPARTMENT Provider Note   CSN: [id number] Arrival date & time:        History Chief Complaint  Patient presents with   Constipation    Lindsey Collins is a 75 y.o. female.  HPI Patient is sent from [facility] with concern for small bowel obstruction.  Patient had x-ray done today.  Patient reports she has been having constipation.  May be up to 10 days.  Abdominal pain comes and goes.  Patient denies she currently has any abdominal pain.  Patient is ventilator dependent with tracheostomy.  She has a feeding tube.    Past Medical History:  Diagnosis Date   Acute on chronic respiratory failure with hypoxia (HCC)    ARDS (adult respiratory distress syndrome) (HCC)    Chronic kidney disease, stage II (mild)    MRSA (methicillin resistant staphylococcus aureus) pneumonia (HCC)    Obstructive chronic bronchitis without exacerbation ([place])     Patient Active Problem List   Diagnosis Date Noted   Ventilator dependent ([place]) 03/23/2021   Pressure injury of skin 03/23/2021   Small bowel obstruction ([place]) 03/22/2021   Chronic respiratory failure with hypoxia ([place]) 12/20/2020   Status post tracheostomy ([place]) 12/20/2020   Normocytic anemia 12/20/2020   Parkinson's disease ([place]) 12/20/2020   Diabetes mellitus type 2, uncontrolled 12/20/2020    Past Surgical History:  Procedure Laterality Date   TRACHEOSTOMY       OB History   No obstetric history on file.     Family History  Problem Relation Age of Onset   CAD Mother        hx CABG    Social History   Tobacco Use   Smoking status: Never   Smokeless tobacco: Never    Home Medications Prior to Admission medications   Medication Sig Start Date End Date Taking? Authorizing Provider  acetaminophen (TYLENOL) 325 MG tablet Place 650 mg into feeding tube every 6 (six) hours as needed for moderate pain.   Yes [provider]  acyclovir (ZOVIRAX) 800 MG tablet Take 800 mg by  mouth every 6 (six) hours. For 7 days, via tube 07/04/21  Yes [provider]  allopurinol (ZYLOPRIM) 100 MG tablet Place 200 mg into feeding tube daily.   Yes [provider]  atorvastatin (LIPITOR) 20 MG tablet Place 20 mg into feeding tube at bedtime. 11/26/20  Yes [provider]  carbidopa-levodopa (SINEMET IR) 25-100 MG tablet Place 1 tablet into feeding tube every 6 (six) hours. Times given : 00:00, 0600; 12:00, 1800 10/28/20  Yes [provider]  chlorhexidine (PERIDEX) 0.12 % solution Use as directed 15 mLs in the mouth or throat 2 (two) times daily.   Yes [provider]  cholecalciferol (VITAMIN D3) 25 MCG (1000 UNIT) tablet Place 1,000 Units into feeding tube daily.   Yes [provider]  cloNIDine (CATAPRES) 0.1 MG tablet Place 0.1 mg into feeding tube every 8 (eight) hours as needed (SBP >180).   Yes [provider]  divalproex (DEPAKOTE SPRINKLE) 125 MG capsule 500 mg 2 (two) times daily. Via tube 11/24/20  Yes [provider]  FLUoxetine (PROZAC) 10 MG tablet Place 10 mg into feeding tube daily. 11/08/20  Yes [provider]  Glucagon, rDNA, (GLUCAGON EMERGENCY) 1 MG KIT Inject 1 mg as directed as needed (blood sugar less than 70).   Yes [provider]  insulin aspart (NOVOLOG) 100 UNIT/ML injection Inject 0-10 Units into the skin every 6 (  six) hours. Sliding scale: CBG 150 = 0 units, 151-200 = 2 units, 201-250 = 4 units, 251-300 = 6 units, 301-350 = 8 units, 351-400 = 10 units.   Yes [provider]  insulin glargine (LANTUS) 100 UNIT/ML injection Inject 0.3 mLs (30 Units total) into the skin at bedtime. Patient taking differently: Inject 30 Units into the skin 2 (two) times daily. 03/26/21  Yes Minor, Grace Bushy, NP  ipratropium-albuterol (DUONEB) 0.5-2.5 (3) MG/3ML SOLN Take 3 mLs by nebulization every 6 (six) hours.   Yes [provider]  metoprolol tartrate (LOPRESSOR) 25 MG  tablet Place 12.5 mg into feeding tube 2 (two) times daily.   Yes [provider]  Nutritional Supplements (FEEDING SUPPLEMENT, GLUCERNA 1.5 CAL,) LIQD Place 50 mLs into feeding tube continuous. 43ms per hour   Yes [provider]  OLANZapine (ZYPREXA) 15 MG tablet Place 15 mg into feeding tube at bedtime.   Yes [provider]  ondansetron (ZOFRAN) 4 MG/2ML SOLN injection Inject 4 mg into the vein every 6 (six) hours as needed for nausea or vomiting.   Yes [provider]  ondansetron (ZOFRAN) 8 MG tablet Place 8 mg into feeding tube every 8 (eight) hours as needed for nausea or vomiting.   Yes [provider]  pantoprazole sodium (PROTONIX) 40 mg/20 mL PACK Place 40 mg into feeding tube 2 (two) times daily.   Yes [provider]  polyethylene glycol (MIRALAX / GLYCOLAX) 17 g packet Place 17 g into feeding tube daily as needed for mild constipation.   Yes [provider]  pramipexole (MIRAPEX) 0.125 MG tablet Place 0.125 mg into feeding tube every 8 (eight) hours. 11/05/20  Yes [provider]  propylthiouracil (PTU) 50 MG tablet Place 50 mg into feeding tube every 12 (twelve) hours. 10/19/20  Yes [provider]  Semaglutide, 1 MG/DOSE, (OZEMPIC, 1 MG/DOSE,) 2 MG/1.5ML SOPN Inject 1 mg into the skin once a week. Thursday's   Yes [provider]    Allergies    Patient has no known allergies.  Review of Systems   Review of Systems Level 5 caveat cannot obtain full review of systems due to communication Physical Exam Updated Vital Signs BP 125/81   Pulse 77   Temp 98.6 F (37 C) (Oral)   Resp 16   SpO2 100%   Physical Exam Constitutional:      Comments: Patient is alert on ventilator.  No appearance of acute distress.  HENT:     Head: Normocephalic and atraumatic.     Mouth/Throat:     Pharynx: Oropharynx is clear.  Eyes:     Extraocular Movements: Extraocular movements intact.  Cardiovascular:      Rate and Rhythm: Normal rate and regular rhythm.  Pulmonary:     Effort: Pulmonary effort is normal.     Breath sounds: Normal breath sounds.  Abdominal:     Comments: Feeding tube in place.  Abdomen feels mildly distended.  Patient denies pain to palpation.  Musculoskeletal:     Comments: Extremities without active wounds.  Skin:    General: Skin is warm and dry.  Neurological:     Comments: Patient is alert.  She does communicate by mouthing words.  Consistent with history and current situation.  Psychiatric:        Mood and Affect: Mood normal.    ED Results / Procedures / Treatments   Labs (all labs ordered are listed, but only abnormal results are displayed) Labs Reviewed  COMPREHENSIVE METABOLIC PANEL - Abnormal; Notable for the following components:      Result Value   BUN <5 (*)    Albumin 3.4 (*)    All other components within normal limits  CBC WITH DIFFERENTIAL/PLATELET - Abnormal; Notable for the following components:   RDW 19.6 (*)    All other components within normal limits  LIPASE, BLOOD  LACTIC ACID, PLASMA  PROTIME-INR    EKG EKG Interpretation  Date/Time:  Wednesday July 09 2021 17:33:44 EDT Ventricular Rate:  79 PR Interval:  165 QRS Duration: 79 QT Interval:  394 QTC Calculation: 452 R Axis:   67 Text Interpretation: Sinus rhythm Low voltage, precordial leads Borderline repolarization abnormality lateral ST depression since previous Confirmed by Charlesetta Shanks [phone]) on 07/09/2021 9:47:30 PM  Radiology No results found.  Procedures Procedures   Medications Ordered in ED Medications - No data to display  ED Course  I have reviewed the triage vital signs and the nursing notes.  Pertinent labs & imaging results that were available during my care of the patient were reviewed by me and considered in my medical decision making (see chart for details).    MDM Rules/Calculators/A&P                           Patient is alert and  nontoxic.  She is having constipation by history.  Intermittent abdominal pain.  Vital signs are stable.  She is sent due to concern for bowel obstruction based on x-rays.  We will proceed with CT scan to further identify any acute intra-abdominal process.  CT scan shows potential small bowel obstruction.  Review of EMR indicates this has been consistent with prior presentations.  Oncoming provider to follow-up CT and final disposition. Final Clinical Impression(s) / ED Diagnoses Final diagnoses:  Constipation, unspecified constipation type  Rash    Rx / DC Orders ED Discharge Orders     None        Charlesetta Shanks, MD 07/17/21 1512

## 2021-07-09 NOTE — ED Provider Notes (Signed)
  Provider Note MRN:  [id number]  Arrival date & time: 07/10/21    ED Course and Medical Decision Making  Assumed care from Dr. Vallery Ridge at shift change.  History of chronic respiratory failure, trach dependence here with intermittent abdominal pain, history of small bowel obstruction, awaiting CT imaging.  CT imaging is without acute process, no obstruction.  On my reevaluation patient is sitting comfortably with normal vital signs.  She is mostly complaining about pain related to this rash to her left lower quadrant which wraps around to her left lower back.  Seems consistent with shingles.  She tells me this has been present for 1 week.  This puts her outside of the window any treatment other than pain control, which can be done at her facility.  No emergent process, no indication for further testing or admission, appropriate for discharge.  Procedures  Final Clinical Impressions(s) / ED Diagnoses     ICD-10-CM   1. Constipation, unspecified constipation type  K59.00     2. Rash  R21       ED Discharge Orders     None         Discharge Instructions      You were evaluated in the Emergency Department and after careful evaluation, we did not find any emergent condition requiring admission or further testing in the hospital.  Your exam/testing today is overall reassuring.  CT scan did not show any bowel obstruction.  Recommend MiraLAX or other stool softeners  Please return to the Emergency Department if you experience any worsening of your condition.   Thank you for allowing [place] to be a part of your care.      Barth Kirks. [place]onia Small, [place] [email or web address]    Maudie Flakes, MD 07/10/21 [phone]

## 2021-07-10 DIAGNOSIS — K5900 Constipation, unspecified: Secondary | ICD-10-CM | POA: Diagnosis not present

## 2021-07-10 MED ORDER — GABAPENTIN 100 MG PO CAPS
100.0000 mg | ORAL_CAPSULE | Freq: Once | ORAL | Status: AC
  Administered 2021-07-10: 100 mg via ORAL
  Filled 2021-07-10: qty 1

## 2021-07-10 MED ORDER — ACETAMINOPHEN 500 MG PO TABS
1000.0000 mg | ORAL_TABLET | Freq: Once | ORAL | Status: AC
  Administered 2021-07-10: 1000 mg via ORAL
  Filled 2021-07-10: qty 2

## 2021-07-10 MED ORDER — POLYETHYLENE GLYCOL 3350 17 G PO PACK
17.0000 g | PACK | Freq: Every day | ORAL | Status: DC
  Filled 2021-07-10: qty 1

## 2021-07-10 NOTE — Discharge Instructions (Addendum)
You were evaluated in the Emergency Department and after careful evaluation, we did not find any emergent condition requiring admission or further testing in the hospital.  Your exam/testing today is overall reassuring.  CT scan did not show any bowel obstruction.  Recommend MiraLAX or other stool softeners to ensure you are having regular bowel movements.  Recommend pain control at your facility regarding your likely shingles rash.  Please return to the Emergency Department if you experience any worsening of your condition.   Thank you for allowing [place] to be a part of your care.

## 2021-08-11 ENCOUNTER — Encounter (HOSPITAL_COMMUNITY): Payer: Self-pay | Admitting: Radiology

## 2022-05-11 ENCOUNTER — Encounter (HOSPITAL_COMMUNITY): Payer: Self-pay | Admitting: Oncology

## 2022-05-11 ENCOUNTER — Emergency Department (HOSPITAL_COMMUNITY): Payer: Medicare Other

## 2022-05-11 ENCOUNTER — Emergency Department (HOSPITAL_COMMUNITY)
Admission: EM | Admit: 2022-05-11 | Discharge: 2022-05-12 | Disposition: A | Discharge status: Skilled Nursing Facility | Payer: Medicare Other | Attending: Emergency Medicine | Admitting: Emergency Medicine

## 2022-05-11 ENCOUNTER — Other Ambulatory Visit: Payer: Self-pay

## 2022-05-11 DIAGNOSIS — N939 Abnormal uterine and vaginal bleeding, unspecified: Secondary | ICD-10-CM

## 2022-05-11 DIAGNOSIS — R9389 Abnormal findings on diagnostic imaging of other specified body structures: Secondary | ICD-10-CM

## 2022-05-11 DIAGNOSIS — R6889 Other general symptoms and signs: Secondary | ICD-10-CM

## 2022-05-11 DIAGNOSIS — N950 Postmenopausal bleeding: Secondary | ICD-10-CM

## 2022-05-11 DIAGNOSIS — R160 Hepatomegaly, not elsewhere classified: Secondary | ICD-10-CM | POA: Insufficient documentation

## 2022-05-11 DIAGNOSIS — J9622 Acute and chronic respiratory failure with hypercapnia: Secondary | ICD-10-CM

## 2022-05-11 DIAGNOSIS — Z79899 Other long term (current) drug therapy: Secondary | ICD-10-CM | POA: Insufficient documentation

## 2022-05-11 DIAGNOSIS — J189 Pneumonia, unspecified organism: Secondary | ICD-10-CM | POA: Insufficient documentation

## 2022-05-11 DIAGNOSIS — Z794 Long term (current) use of insulin: Secondary | ICD-10-CM | POA: Diagnosis not present

## 2022-05-11 DIAGNOSIS — J9621 Acute and chronic respiratory failure with hypoxia: Secondary | ICD-10-CM

## 2022-05-11 DIAGNOSIS — C787 Secondary malignant neoplasm of liver and intrahepatic bile duct: Secondary | ICD-10-CM

## 2022-05-11 LAB — COMPREHENSIVE METABOLIC PANEL
ALT: 10 U/L (ref 0–44)
AST: 13 U/L — ABNORMAL LOW (ref 15–41)
Albumin: 3.2 g/dL — ABNORMAL LOW (ref 3.5–5.0)
Alkaline Phosphatase: 53 U/L (ref 38–126)
Anion gap: 8 (ref 5–15)
BUN: 29 mg/dL — ABNORMAL HIGH (ref 8–23)
CO2: 34 mmol/L — ABNORMAL HIGH (ref 22–32)
Calcium: 8.7 mg/dL — ABNORMAL LOW (ref 8.9–10.3)
Chloride: 94 mmol/L — ABNORMAL LOW (ref 98–111)
Creatinine, Ser: 0.62 mg/dL (ref 0.44–1.00)
GFR, Estimated: >60 mL/min (ref >60)
Glucose, Bld: 359 mg/dL — ABNORMAL HIGH (ref 70–99)
Potassium: 4.6 mmol/L (ref 3.5–5.1)
Sodium: 136 mmol/L (ref 135–145)
Total Bilirubin: 0.5 mg/dL (ref 0.3–1.2)
Total Protein: 7 g/dL (ref 6.5–8.1)

## 2022-05-11 LAB — CBC WITH DIFFERENTIAL/PLATELET
Abs Immature Granulocytes: 0.38 10*3/uL — ABNORMAL HIGH (ref 0.00–0.07)
Basophils Absolute: 0.1 10*3/uL (ref 0.0–0.1)
Basophils Relative: 1 %
Eosinophils Absolute: 0 10*3/uL (ref 0.0–0.5)
Eosinophils Relative: 0 %
HCT: 38.6 % (ref 36.0–46.0)
Hemoglobin: 12.2 g/dL (ref 12.0–15.0)
Immature Granulocytes: 4 %
Lymphocytes Relative: 15 %
Lymphs Abs: 1.4 10*3/uL (ref 0.7–4.0)
MCH: 29 pg (ref 26.0–34.0)
MCHC: 31.6 g/dL (ref 30.0–36.0)
MCV: 91.9 fL (ref 80.0–100.0)
Monocytes Absolute: 0.6 10*3/uL (ref 0.1–1.0)
Monocytes Relative: 7 %
Neutro Abs: 6.8 10*3/uL (ref 1.7–7.7)
Neutrophils Relative %: 73 %
Platelets: 279 10*3/uL (ref 150–400)
RBC: 4.2 MIL/uL (ref 3.87–5.11)
RDW: 14.2 % (ref 11.5–15.5)
WBC: 9.2 10*3/uL (ref 4.0–10.5)
nRBC: 0.5 % — ABNORMAL HIGH (ref 0.0–0.2)

## 2022-05-11 LAB — PROTIME-INR
INR: 1.1 (ref 0.8–1.2)
Prothrombin Time: 13.7 seconds (ref 11.4–15.2)

## 2022-05-11 MED ORDER — CEFEPIME HCL 2 G IV SOLR
2.0000 g | Freq: Once | INTRAVENOUS | Status: DC
  Administered 2022-05-11: 2 g via INTRAVENOUS
  Filled 2022-05-11: qty 12.5

## 2022-05-11 MED ORDER — IOHEXOL 300 MG/ML  SOLN
100.0000 mL | Freq: Once | INTRAMUSCULAR | Status: AC | PRN
  Administered 2022-05-11: 100 mL via INTRAVENOUS

## 2022-05-11 MED ORDER — VANCOMYCIN HCL IN DEXTROSE 1-5 GM/200ML-% IV SOLN: 1000.0000 mg | Freq: Once | INTRAVENOUS | Status: DC

## 2022-05-11 MED ORDER — DOXYCYCLINE HYCLATE 100 MG PO CAPS: 100.0000 mg | ORAL_CAPSULE | Freq: Two times a day (BID) | ORAL | 0 refills | Status: AC

## 2022-05-11 MED ORDER — SODIUM CHLORIDE (PF) 0.9 % IJ SOLN
INTRAMUSCULAR | Status: AC
  Filled 2022-05-11: qty 50

## 2022-05-11 NOTE — Consult Note (Signed)
Initial Consultation Note   Patient: Lindsey Collins CVE:[id number] DOB: 1945-10-30 PCP: Townsend Roger, MD DOA: 05/11/2022 DOS: the patient was seen and examined on 05/11/2022 Primary service: Carmin Muskrat, MD  Referring physician: Carmin Muskrat, MD Reason for consult: Hypoxia, Vaginal Bleeding   Assessment/Plan: Assessment and Plan: No notes have been filed under this hospital service. Service: Hospitalist  Acute on chronic respiratory failure with hypoxia and hypercapnia who is chronically vent dependent 5 out of the 7 days a week Obstructive chronic bronchitis without exacerbation -Had a period of hypoxia on 70% and had to be placed back on the vent in the ED -Continue pulm toileting and suctioning -Typically has a CPAP via tracheostomy but also wears the ventilator 5 to 7 days a week given her chronic respiratory failure -She had a CT scan of the abdomen pelvis given her vaginal bleeding and it showed sequela of chronic aspiration of the left lung base with material in the right lower lobe bronchi potentially aspirated material anticipated secretions -Recommend aggressive pulm toileting and suctioning -Continue bronchodilators as needed and schedule if necessary -She was given IV antibiotics for possible HCAP however there is no objective evidence of infection at this is likely chronic aspiration as she is afebrile and has no leukocytosis; she was treated last time she was hospitalized a year ago for an aspiration pneumonia that was present on admission at that time with Serratia marcescens -She received a dose of IV cefepime and if further antibiotics are warranted she can receive antibiotics at her long-term acute care facility however I do not suspect infection -If the ED provider is concerned for infection obtain procalcitonin and blood cultures as well as lactic acid however her presentation is not consistent with a sepsis picture -Recommend her following up with pulmonary in  outpatient setting within 1 week  Vaginal bleeding in the setting of 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 metastatic disease versus a possible GI bleeding however more likely vaginal bleeding -This was her presenting complaint to the ED as she is noted to have bleeding and fresh blood in her diaper at [facility] and dried blood under her vulva.  Likely had vaginal bleeding and became worried and so she was sent for further evaluation -CT scan of the abdomen pelvis done and showed a thickened and heterogenous endometrium -I spoke with Dr. Dione Plover of gynecology who recommended outpatient work-up for this with notification to Dr. Berline Lopes; he felt no further inpatient work-up is needed at this time and felt all the tumor markers and ultrasounds can be done in the outpatient setting; -As a courtesy I will make an ambulatory referral to gynecology oncology and notify Dr. Berline Lopes via secure chat; Dr. Berline Lopes responded and feels that it is reasonable to have her get outpatient work-up and recommended scheduling gynecology to do an endometrial biopsy soon and ambulatory referral to gynecology has been made as well -Referred to outpatient gynecology for further work-up and number was provided -She also need will oncology follow-up in outpatient setting for suspected liver metastatic disease and will need biopsy and this was also made via epic -Her BUN was slightly elevated which potentially could be seen in the setting of an upper GI bleeding however patient denies any upper abdominal discomfort or pain and she was noted to have yellowish-brownish stool noted -She has had a history of A-fib on apixaban and amiodarone so would recommend holding her apixaban if she is still on it anticipation for endometrial biopsy -Recommend that she resume her home  PPI twice daily and have outpatient GI evaluate if necessary   Constipation with Chronic Abdominal Pain -Patient not obstructed. Recommend starting aggressive bowel  regimen given that she has had a history of a prior partial colonic resection -She can resume her MiraLAX 17 g but recommend scheduling it daily instead of as needed and also adding a stool softener such as senna docusate 1 tab p.o. 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 -CT scan of the abdomen pelvis done and showed numerous focal areas of low-attenuation are developed in the liver since her previous imaging was concerning for metastatic disease -The differential could include multifocal nodular hepatic steatosis as exclusion but she would need a hepatic MRI with and without contrast in outpatient setting however unclear if patient is able to hold her breath given that she is chronically vented -I have made a referral to the [facility] for her to further be evaluated for this and have further work-up  Hypoalbuminemia -Patient's albumin level was 3.2 -Recommend nutritionist consultation in outpatient setting  Hyperglycemia in the setting of diabetes mellitus type 2 -Recommend treating with Insulin prior to discharging back to LTAC -She can be resumed on her home regimen of 30 units of Lantus nightly as well as her NovoLog sliding scale -Also recommend obtaining a recent hemoglobin A1c  Parkinson's Disease -Continue home carbidopa levodopa -Follow up with Neurology   Chronic kidney disease stage II -Patient's BUNs/creatinine was 29/0.62 on admission -Avoid further nephrotoxic medications, contrast dyes, hypotension and dehydration to ensure adequate renal perfusion -Repeat CMP within 1 week  Hyperlipidemia -Continue with atorvastatin 20 mg nightly into the PEG tube  Gout -Continue with allopurinol 200 g to the tube feeding daily  Depression and anxiety -Continue with  divalproex 500 g p.o. twice daily, fluoxetine 10 mg p.o. feeding tube daily as well as olanzapine 50 mg in the feeding tube nightly  Hyperthyroidism -Recommend checking a TSH and continuing prior admission PTU as well as metoprolol tartrate  Obesity -Complicates overall prognosis and care -Estimated body mass index is 33.87 kg/m as calculated from the following:   Height as of 03/22/21: _0  (1.626 m).   Weight as of 03/26/21: 89.5 kg.  -Weight Loss and Dietary Counseling given   TR[facility] will sign off at present, please call us again when needed.  HPI: ELLISSA AYO is a 76 y.o. female with past medical history of acute on chronic failure with hypoxia who is chronically vent dependent, chronic kidney disease stage II, MRSA, history of obstructive chronic bronchitis as well as Parkinson's disease, depression and anxiety, gout, hypertension, hyperlipidemia, chronically bedbound who is trach and PEG dependent who presented to the hospital when she got worried that she is having some bleeding as there was found in her diaper.  Patient's staff noted that when they were changing her they found bright red blood and she has not been on any blood thinning medications recently however was on anticoagulation for A-fib last year.  Neither nursing nor EMS reported instability and because of her concern of bleed she is brought to the hospital for further evaluation and chronically she is on oxygen supplementation.  Upon further evaluation her hemoglobin is stable and she has no objective signs or symptoms for infection given that she is afebrile and has no leukocytosis.  Blood count remained stable.  [facility] hospital[facility] was consulted for admission for this patient for hypoxia and possible HCAP as well as her vaginal bleeding.  Given that she is chronically vent dependent and wears of it 5 out of 7 days I feel that she did not need to be admitted for hypoxia and felt that she has chronic aspiration which would need  aggressive pulm toileting and suctioning.  For her vaginal bleeding I spoke with Dr. Dione Plover who did not feel that she warranted inpatient work-up for this and felt that this was mainly outpatient follow-up.  I also spoke with Dr. Berline Lopes via secure chat and she is in agreement that it is reasonable to have the patient be discharged and have outpatient follow-up for this given her concern for gynecological malignancy.  At this time I recommended the patient not be admitted into the hospital and have close outpatient follow-up and have made referrals to the appropriate providers.  Review of Systems: As mentioned in the history of present illness. All other systems reviewed and are negative. Past Medical History:  Diagnosis Date   Acute on chronic respiratory failure with hypoxia (HCC)    ARDS (adult respiratory distress syndrome) (HCC)    Chronic kidney disease, stage II (mild)    MRSA (methicillin resistant staphylococcus aureus) pneumonia (HCC)    Obstructive chronic bronchitis without exacerbation ([place])    Past Surgical History:  Procedure Laterality Date   TRACHEOSTOMY     Social History:  reports that she has never smoked. She has never used smokeless tobacco. No history on file for alcohol use and drug use.  No Known Allergies  Family History  Problem Relation Age of Onset   CAD Mother        hx CABG    Prior to Admission medications   Medication Sig Start Date End Date Taking? Authorizing Provider  doxycycline (VIBRAMYCIN) 100 MG capsule Take 1 capsule (100 mg total) by mouth 2 (two) times daily. One po bid x 7 days 05/11/22  Yes Milton Ferguson, MD  acetaminophen (TYLENOL) 325 MG tablet Place 650 mg into feeding tube every 6 (six) hours as needed for moderate pain.    [provider]  acyclovir (ZOVIRAX) 800 MG tablet Take 800 mg by mouth every 6 (six) hours. For 7 days, via tube 07/04/21   [provider]  allopurinol (ZYLOPRIM) 100 MG tablet Place 200 mg into  feeding tube daily.    [provider]  atorvastatin (LIPITOR) 20 MG tablet Place 20 mg into feeding tube at bedtime. 11/26/20   [provider]  carbidopa-levodopa (SINEMET IR) 25-100 MG tablet Place 1 tablet into feeding tube every 6 (six) hours. Times given : 00:00, 0600; 12:00, 1800 10/28/20   [provider]  chlorhexidine (PERIDEX) 0.12 % solution Use as directed 15 mLs in the mouth or throat 2 (two) times daily.    [provider]  cholecalciferol (VITAMIN D3) 25 MCG (1000 UNIT) tablet Place 1,000 Units into feeding tube daily.    [provider]  cloNIDine (CATAPRES) 0.1 MG tablet Place 0.1 mg into feeding tube every 8 (eight) hours as needed (SBP >180).    [provider]  divalproex (DEPAKOTE SPRINKLE) 125 MG capsule 500 mg 2 (two) times daily. Via tube 11/24/20   [provider]  FLUoxetine (PROZAC) 10 MG tablet Place 10 mg into feeding tube daily. 11/08/20   [provider]  Glucagon, rDNA, (GLUCAGON EMERGENCY) 1 MG KIT Inject 1 mg as  directed as needed (blood sugar less than 70).    [provider]  insulin aspart (NOVOLOG) 100 UNIT/ML injection Inject 0-10 Units into the skin every 6 (six) hours. Sliding scale: CBG 150 = 0 units, 151-200 = 2 units, 201-250 = 4 units, 251-300 = 6 units, 301-350 = 8 units, 351-400 = 10 units.    [provider]  insulin glargine (LANTUS) 100 UNIT/ML injection Inject 0.3 mLs (30 Units total) into the skin at bedtime. Patient taking differently: Inject 30 Units into the skin 2 (two) times daily. 03/26/21   Minor, Grace Bushy, NP  ipratropium-albuterol (DUONEB) 0.5-2.5 (3) MG/3ML SOLN Take 3 mLs by nebulization every 6 (six) hours.    [provider]  metoprolol tartrate (LOPRESSOR) 25 MG tablet Place 12.5 mg into feeding tube 2 (two) times daily.    [provider]  Nutritional Supplements (FEEDING SUPPLEMENT, GLUCERNA 1.5 CAL,) LIQD Place 50 mLs into feeding  tube continuous. 58ms per hour    [provider]  OLANZapine (ZYPREXA) 15 MG tablet Place 15 mg into feeding tube at bedtime.    [provider]  ondansetron (ZOFRAN) 4 MG/2ML SOLN injection Inject 4 mg into the vein every 6 (six) hours as needed for nausea or vomiting.    [provider]  ondansetron (ZOFRAN) 8 MG tablet Place 8 mg into feeding tube every 8 (eight) hours as needed for nausea or vomiting.    [provider]  pantoprazole sodium (PROTONIX) 40 mg/20 mL PACK Place 40 mg into feeding tube 2 (two) times daily.    [provider]  polyethylene glycol (MIRALAX / GLYCOLAX) 17 g packet Place 17 g into feeding tube daily as needed for mild constipation.    [provider]  pramipexole (MIRAPEX) 0.125 MG tablet Place 0.125 mg into feeding tube every 8 (eight) hours. 11/05/20   [provider]  propylthiouracil (PTU) 50 MG tablet Place 50 mg into feeding tube every 12 (twelve) hours. 10/19/20   [provider]  Semaglutide, 1 MG/DOSE, (OZEMPIC, 1 MG/DOSE,) 2 MG/1.5ML SOPN Inject 1 mg into the skin once a week. Thursday's    [provider]    Physical Exam: Vitals:   05/11/22 1538 05/11/22 1600 05/11/22 1630 05/11/22 1700  BP: (!) 153/99 (!) 169/99 (!) 176/89 (!) 166/83  Pulse: 81 78 82 75  Resp: (!) 24 (!) 23 (!) 27 17  SpO2: 100% 96% 90% 93%   Examination: Physical Exam:  Constitutional: WN/WD obese chronically ill-appearing Caucasian female who is in no acute distress and on the ventilator Neck: Has a tracheostomy in place and is connected to the ventilator and has unlabored breathing Respiratory: Diminished to auscultation bilaterally with coarse breath sounds and some diminished sounds at the bases slightly worse on the left compared to right with no appreciable rales or rhonchi.  No appreciable crackles.  On the ventilator via tracheostomy resting comfortably Cardiovascular: RRR, no murmurs / rubs /  gallops. S1 and S2 auscultated.  Has 1+ lower extremity edema Abdomen: Soft, mildly tender in the right lower side and flank will follow questioning not too different from her chronic abdominal pain,  distended secondary body habitus and has a PEG tube in place in the left upper quadrant bowel sounds positive.  GU: Deferred. Musculoskeletal: No clubbing / cyanosis of digits/nails.  No appreciable joint deformities in the upper or lower extremities Neurologic: Will phonate due to her tracheostomy however is able to communicate via writing and whispering.  Cranial nerves  II through XII otherwise grossly intact.   Psychiatric: Normal judgment and insight. Alert and oriented x 3.  Data Reviewed:   Recent Results (from the past 2160 hour(s))  Comprehensive metabolic panel     Status: Abnormal   Collection Time: 05/11/22  2:00 PM  Result Value Ref Range   Sodium 136 135 - 145 mmol/L   Potassium 4.6 3.5 - 5.1 mmol/L   Chloride 94 (L) 98 - 111 mmol/L   CO2 34 (H) 22 - 32 mmol/L   Glucose, Bld 359 (H) 70 - 99 mg/dL    Comment: Glucose reference range applies only to samples taken after fasting for at least 8 hours.   BUN 29 (H) 8 - 23 mg/dL   Creatinine, Ser 0.62 0.44 - 1.00 mg/dL   Calcium 8.7 (L) 8.9 - 10.3 mg/dL   Total Protein 7.0 6.5 - 8.1 g/dL   Albumin 3.2 (L) 3.5 - 5.0 g/dL   AST 13 (L) 15 - 41 U/L   ALT 10 0 - 44 U/L   Alkaline Phosphatase 53 38 - 126 U/L   Total Bilirubin 0.5 0.3 - 1.2 mg/dL   GFR, Estimated >60 >60 mL/min    Comment: (NOTE) Calculated using the CKD-EPI Creatinine Equation (2021)    Anion gap 8 5 - 15    Comment: Performed at [facility], [place] [address], [place], [place] [address]  CBC with Differential     Status: Abnormal   Collection Time: 05/11/22  2:00 PM  Result Value Ref Range   WBC 9.2 4.0 - 10.5 K/uL   RBC 4.20 3.87 - 5.11 MIL/uL   Hemoglobin 12.2 12.0 - 15.0 g/dL   HCT 38.6 36.0 - 46.0 %   MCV 91.9 80.0 - 100.0 fL   MCH 29.0  26.0 - 34.0 pg   MCHC 31.6 30.0 - 36.0 g/dL   RDW 14.2 11.5 - 15.5 %   Platelets 279 150 - 400 K/uL   nRBC 0.5 (H) 0.0 - 0.2 %   Neutrophils Relative % 73 %   Neutro Abs 6.8 1.7 - 7.7 K/uL   Lymphocytes Relative 15 %   Lymphs Abs 1.4 0.7 - 4.0 K/uL   Monocytes Relative 7 %   Monocytes Absolute 0.6 0.1 - 1.0 K/uL   Eosinophils Relative 0 %   Eosinophils Absolute 0.0 0.0 - 0.5 K/uL   Basophils Relative 1 %   Basophils Absolute 0.1 0.0 - 0.1 K/uL   Immature Granulocytes 4 %   Abs Immature Granulocytes 0.38 (H) 0.00 - 0.07 K/uL    Comment: Performed at [facility], [place] [address], [place], [place] [address]  Protime-INR     Status: None   Collection Time: 05/11/22  2:00 PM  Result Value Ref Range   Prothrombin Time 13.7 11.4 - 15.2 seconds   INR 1.1 0.8 - 1.2    Comment: (NOTE) INR goal varies based on device and disease states. Performed at [facility], [place] [address], [place],  [address]     Family Communication: No family present at bedside Primary team communication: Discussed with ED Providers Dr. Vanita Panda and Dr. Milton Ferguson  Thank you very much for involving [place] in the care of your patient.  Author: Raiford Noble, DO [facility]  05/11/2022 5:49 PM  For on call review [email or web address].

## 2022-05-11 NOTE — ED Provider Notes (Signed)
[facility] DEPT Provider Note   CSN: [id number] Arrival date & time: 05/11/22  1354     History  Chief Complaint  Patient presents with   Bleeding/Bruising    Lindsey Collins is a 76 y.o. female.  HPI Patient presents from local long-term acute care facility due to bleeding.  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  She answers questions briefly, but does describe overall condition.  She denies pain, lightheadedness.  Staff at the facility noted that today on changing the patient's diaper they found bright red blood.  Patient has no history of this, takes no blood thinning medication.  Neither nursing staff nor EMS report EMS instability.  Patient requires chronic oxygen supplementation, EMS reports that this was provided in route without complication.    Home Medications Prior to Admission medications   Medication Sig Start Date End Date Taking? Authorizing Provider  acetaminophen (TYLENOL) 325 MG tablet Place 650 mg into feeding tube every 6 (six) hours as needed for moderate pain.    [provider]  acyclovir (ZOVIRAX) 800 MG tablet Take 800 mg by mouth every 6 (six) hours. For 7 days, via tube 07/04/21   [provider]  allopurinol (ZYLOPRIM) 100 MG tablet Place 200 mg into feeding tube daily.    [provider]  atorvastatin (LIPITOR) 20 MG tablet Place 20 mg into feeding tube at bedtime. 11/26/20   [provider]  carbidopa-levodopa (SINEMET IR) 25-100 MG tablet Place 1 tablet into feeding tube every 6 (six) hours. Times given : 00:00, 0600; 12:00, 1800 10/28/20   [provider]  chlorhexidine (PERIDEX) 0.12 % solution Use as directed 15 mLs in the mouth or throat 2 (two) times daily.    [provider]  cholecalciferol (VITAMIN D3) 25 MCG (1000 UNIT) tablet Place 1,000 Units into feeding tube daily.    [provider]  cloNIDine  (CATAPRES) 0.1 MG tablet Place 0.1 mg into feeding tube every 8 (eight) hours as needed (SBP >180).    [provider]  divalproex (DEPAKOTE SPRINKLE) 125 MG capsule 500 mg 2 (two) times daily. Via tube 11/24/20   [provider]  FLUoxetine (PROZAC) 10 MG tablet Place 10 mg into feeding tube daily. 11/08/20   [provider]  Glucagon, rDNA, (GLUCAGON EMERGENCY) 1 MG KIT Inject 1 mg as directed as needed (blood sugar less than 70).    [provider]  insulin aspart (NOVOLOG) 100 UNIT/ML injection Inject 0-10 Units into the skin every 6 (six) hours. Sliding scale: CBG 150 = 0 units, 151-200 = 2 units, 201-250 = 4 units, 251-300 = 6 units, 301-350 = 8 units, 351-400 = 10 units.    [provider]  insulin glargine (LANTUS) 100 UNIT/ML injection Inject 0.3 mLs (30 Units total) into the skin at bedtime. Patient taking differently: Inject 30 Units into the skin 2 (two) times daily. 03/26/21   Minor, Grace Bushy, NP  ipratropium-albuterol (DUONEB) 0.5-2.5 (3) MG/3ML SOLN Take 3 mLs by nebulization every 6 (six) hours.    [provider]  metoprolol tartrate (LOPRESSOR) 25 MG tablet Place 12.5 mg into feeding tube 2 (two) times daily.    [provider]  Nutritional Supplements (FEEDING SUPPLEMENT, GLUCERNA 1.5 CAL,) LIQD Place 50 mLs into feeding tube continuous. 1ms per hour    [provider]  OLANZapine (ZYPREXA) 15 MG tablet Place 15 mg into feeding tube at bedtime.  [provider]  ondansetron (ZOFRAN) 4 MG/2ML SOLN injection Inject 4 mg into the vein every 6 (six) hours as needed for nausea or vomiting.    [provider]  ondansetron (ZOFRAN) 8 MG tablet Place 8 mg into feeding tube every 8 (eight) hours as needed for nausea or vomiting.    [provider]  pantoprazole sodium (PROTONIX) 40 mg/20 mL PACK Place 40 mg into feeding tube 2 (two) times daily.    [provider]  polyethylene glycol  (MIRALAX / GLYCOLAX) 17 g packet Place 17 g into feeding tube daily as needed for mild constipation.    [provider]  pramipexole (MIRAPEX) 0.125 MG tablet Place 0.125 mg into feeding tube every 8 (eight) hours. 11/05/20   [provider]  propylthiouracil (PTU) 50 MG tablet Place 50 mg into feeding tube every 12 (twelve) hours. 10/19/20   [provider]  Semaglutide, 1 MG/DOSE, (OZEMPIC, 1 MG/DOSE,) 2 MG/1.5ML SOPN Inject 1 mg into the skin once a week. Thursday's    [provider]      Allergies    Patient has no known allergies.    Review of Systems   Review of Systems  All other systems reviewed and are negative.   Physical Exam Updated Vital Signs BP (!) 176/89   Pulse 82   Resp (!) 27   SpO2 90%  Physical Exam Vitals and nursing note reviewed. Exam conducted with a chaperone present.  Constitutional:      General: She is not in acute distress.    Appearance: She is well-developed. She is obese. She is ill-appearing. She is not toxic-appearing or diaphoretic.  HENT:     Head: Normocephalic and atraumatic.  Eyes:     Conjunctiva/sclera: Conjunctivae normal.  Neck:     Comments: Tracheostomy unremarkable Cardiovascular:     Rate and Rhythm: Normal rate and regular rhythm.  Pulmonary:     Effort: Pulmonary effort is normal. No respiratory distress.     Breath sounds: Normal breath sounds. No stridor.  Abdominal:     General: There is no distension.     Tenderness: There is no abdominal tenderness. There is no guarding.  Genitourinary:    General: Normal vulva.    Skin:    General: Skin is warm and dry.  Neurological:     Mental Status: She is alert and oriented to person, place, and time.     Cranial Nerves: No cranial nerve deficit.     Comments: Patient cannot phonate due to prior tracheostomy, otherwise cranial nerves unremarkable.  She moves all extremities minimally, but participates in conversation, is grossly  neurologically at baseline.  Psychiatric:        Mood and Affect: Mood normal.     ED Results / Procedures / Treatments   Labs (all labs ordered are listed, but only abnormal results are displayed) Labs Reviewed  COMPREHENSIVE METABOLIC PANEL - Abnormal; Notable for the following components:      Result Value   Chloride 94 (*)    CO2 34 (*)    Glucose, Bld 359 (*)    BUN 29 (*)    Calcium 8.7 (*)    Albumin 3.2 (*)    AST 13 (*)    All other components within normal limits  CBC WITH DIFFERENTIAL/PLATELET - Abnormal; Notable for the following components:   nRBC 0.5 (*)    Abs Immature Granulocytes 0.38 (*)    All other components within normal limits  PROTIME-INR    EKG None  Radiology IMPRESSION: 1. Numerous focal areas of low attenuation have developed in the liver since previous imaging. Findings are concerning for metastatic disease. Differential includes multifocal nodular hepatic steatosis as a diagnosis of exclusion, as such hepatic MRI with and without contrast, preferably as an outpatient with the patient is better able to hold their breath and cooperate with instructions is suggested for further evaluation. 2. Thickened and heterogeneous endometrium. Would correlate with vaginal bleeding which could explain the presence of blood in the patient's underwear/briefs. The possibility of gynecologic malignancy is considered given constellation of findings outlined above. Evaluation with pelvic sonogram may be helpful for further assessment with gyn follow-up as warranted.. 3. Sequela of chronic aspiration at the LEFT lung base with material in RIGHT lower lobe bronchi potentially aspirated material or inspissated secretions. 4. Ovoid density in the anterior chest wall likely a sebaceous cyst. Correlation with direct physical examination may be helpful. 5. Signs of prior partial colonic resection. 6. Aortic atherosclerosis.   Procedures Procedures     Medications Ordered in ED Medications  sodium chloride (PF) 0.9 % injection (has no administration in time range)  ceFEPIme (MAXIPIME) 1 g in sodium chloride 0.9 % 100 mL IVPB (has no administration in time range)  iohexol (OMNIPAQUE) 300 MG/ML solution 100 mL (100 mLs Intravenous Contrast Given 05/11/22 1528)    ED Course/ Medical Decision Making/ A&P This patient with a Hx of multiple medical issues including bedbound status, trach dependency, feeding tube dependent, prior SBO presents to the ED for concern of bleeding, rectal or vaginal, this involves an extensive number of treatment options, and is a complaint that carries with it a high risk of complications and morbidity.    The differential diagnosis includes diverticulosis, hemorrhoids, vaginal bleeding, infection   Social Determinants of Health:  Obesity, age, bedbound status, chronic illness  Additional history obtained:  Additional history and/or information obtained from chart review, notable for prior small bowel obstruction, admission.  Nursing notes also reviewed   After the initial evaluation, orders, including: CT, labs were initiated.   Patient placed on Cardiac and Pulse-Oximetry Monitors. The patient was maintained on a cardiac monitor.  The cardiac monitored showed an rhythm of 80 sinus normal The patient was also maintained on pulse oximetry. The readings were typically 100% on CPAP via tracheostomy abnormal   On repeat evaluation of the patient is found to be hypoxic,70%.  This improved with additional oxygen, the patient is noted to suction at home.  On questioning her she states that she does feel somewhat like she has previously with pneumonia, but does not have substantially dyspnea.  Lab Tests:  I personally interpreted labs.  The pertinent results include: Unremarkable labs for mild hyperglycemia  Imaging Studies ordered:  I independently visualized and interpreted imaging which showed likely  opacification in the lower lobes, right greater than left, notable hepatic lesions, and per radiology endometrial thickening I agree with the radiologist interpretation  Consultations Obtained:  I requested consultation with the OB/GYN (Dr. Dione Plover) and discussed lab and imaging findings as well as pertinent plan - they recommend: Additional evaluation by gynecology oncology, with consideration of additional labs, imaging at that point.  No indication for ultrasound tonight.  Dispostion / Final MDM:  After consideration of the diagnostic results and the patient's response to treatment, adult female with multiple medical issues including chronic respiratory difficulty, trach dependency, baseline immobility presents with concern of bleeding, is found to have episodic hypoxia as  well.  Given the patient's vent dependency, with hypoxia some suspicion for pneumonia but chronic aspiration was initial consideration..  Other notable findings including likely endometrial thickening, and with bleeding, and hepatic lesions some consideration of malignancy, and need for additional evaluation.  I discussed the patient's case with our hospitalist colleagues for consideration of admission, and on signout the patient is awaiting evaluation.  Final Clinical Impression(s) / ED Diagnoses Final diagnoses:  Vaginal bleeding  HCAP (healthcare-associated pneumonia)  Liver mass     Carmin Muskrat, MD 05/11/22 1702    Carmin Muskrat, MD 05/12/22 2348

## 2022-05-11 NOTE — ED Notes (Signed)
Report called and given to [facility] RN at [facility].

## 2022-05-11 NOTE — ED Triage Notes (Signed)
Pt bib [facility] on vent from [facility] d/t staff seeing a large amount of blood w/ clots in her brief while changing her. Pt has no c/o.

## 2022-05-11 NOTE — ED Notes (Addendum)
Transport called for pt.

## 2022-05-11 NOTE — Progress Notes (Signed)
Rt called to place vent on chronic vented patient from [facility].

## 2022-05-11 NOTE — ED Notes (Signed)
Spoke w/ Dr. Roderic Palau d/t pt not wanting to be discharged, pt states she feels she needs admission. Per Dr. Roderic Palau call admitting to see what they would like to do. Currently shift change and unable to find hospitalist note.

## 2022-05-11 NOTE — Progress Notes (Signed)
A consult was received from an ED physician for vanc per pharmacy dosing.  The patient's profile has been reviewed for ht/wt/allergies/indication/available labs.   A one time order has been placed for vanc 1g.  Further antibiotics/pharmacy consults should be ordered by admitting physician if indicated.                       Thank you, Kara Mead 05/11/2022  4:42 PM

## 2022-05-11 NOTE — Discharge Instructions (Signed)
Follow-up with Dr. Gaylyn Rong for your vaginal bleeding.  You have also been placed on doxycycline for possible pneumonia

## 2022-05-13 ENCOUNTER — Telehealth: Payer: Self-pay | Admitting: *Deleted

## 2022-05-13 NOTE — Telephone Encounter (Signed)
RN from [facility] called regarding diagnosis for antibiotics prescribed.  RNCM reviewed chart to find antibiotics was given for suspected HCAP.

## 2022-05-19 ENCOUNTER — Encounter (HOSPITAL_COMMUNITY): Payer: Self-pay

## 2022-05-19 ENCOUNTER — Other Ambulatory Visit: Payer: Self-pay

## 2022-05-19 ENCOUNTER — Emergency Department (HOSPITAL_COMMUNITY): Payer: Medicare Other

## 2022-05-19 ENCOUNTER — Emergency Department (HOSPITAL_COMMUNITY)
Admission: EM | Admit: 2022-05-19 | Discharge: 2022-05-19 | Disposition: A | Discharge status: Home / Self Care | Payer: Medicare Other | Attending: Emergency Medicine | Admitting: Emergency Medicine

## 2022-05-19 ENCOUNTER — Other Ambulatory Visit (HOSPITAL_COMMUNITY): Admission: RE | Admit: 2022-05-19 | Payer: Medicare Other | Source: Ambulatory Visit

## 2022-05-19 DIAGNOSIS — N939 Abnormal uterine and vaginal bleeding, unspecified: Secondary | ICD-10-CM

## 2022-05-19 DIAGNOSIS — J449 Chronic obstructive pulmonary disease, unspecified: Secondary | ICD-10-CM | POA: Insufficient documentation

## 2022-05-19 DIAGNOSIS — N950 Postmenopausal bleeding: Secondary | ICD-10-CM | POA: Diagnosis not present

## 2022-05-19 DIAGNOSIS — G20 Parkinson's disease: Secondary | ICD-10-CM | POA: Insufficient documentation

## 2022-05-19 DIAGNOSIS — R102 Pelvic and perineal pain: Secondary | ICD-10-CM | POA: Diagnosis not present

## 2022-05-19 LAB — CBC WITH DIFFERENTIAL/PLATELET
Abs Immature Granulocytes: 0.03 10*3/uL (ref 0.00–0.07)
Basophils Absolute: 0 10*3/uL (ref 0.0–0.1)
Basophils Relative: 0 %
Eosinophils Absolute: 0.2 10*3/uL (ref 0.0–0.5)
Eosinophils Relative: 2 %
HCT: 36.2 % (ref 36.0–46.0)
Hemoglobin: 11.3 g/dL — ABNORMAL LOW (ref 12.0–15.0)
Immature Granulocytes: 0 %
Lymphocytes Relative: 23 %
Lymphs Abs: 2 10*3/uL (ref 0.7–4.0)
MCH: 29 pg (ref 26.0–34.0)
MCHC: 31.2 g/dL (ref 30.0–36.0)
MCV: 92.8 fL (ref 80.0–100.0)
Monocytes Absolute: 0.5 10*3/uL (ref 0.1–1.0)
Monocytes Relative: 6 %
Neutro Abs: 5.7 10*3/uL (ref 1.7–7.7)
Neutrophils Relative %: 69 %
Platelets: 260 10*3/uL (ref 150–400)
RBC: 3.9 MIL/uL (ref 3.87–5.11)
RDW: 14.6 % (ref 11.5–15.5)
WBC: 8.4 10*3/uL (ref 4.0–10.5)
nRBC: 0 % (ref 0.0–0.2)

## 2022-05-19 LAB — BASIC METABOLIC PANEL
Anion gap: 6 (ref 5–15)
BUN: 14 mg/dL (ref 8–23)
CO2: 37 mmol/L — ABNORMAL HIGH (ref 22–32)
Calcium: 9.1 mg/dL (ref 8.9–10.3)
Chloride: 96 mmol/L — ABNORMAL LOW (ref 98–111)
Creatinine, Ser: 0.48 mg/dL (ref 0.44–1.00)
GFR, Estimated: >60 mL/min (ref >60)
Glucose, Bld: 157 mg/dL — ABNORMAL HIGH (ref 70–99)
Potassium: 3.9 mmol/L (ref 3.5–5.1)
Sodium: 139 mmol/L (ref 135–145)

## 2022-05-19 LAB — POC OCCULT BLOOD, ED: Fecal Occult Bld: NEGATIVE

## 2022-05-19 NOTE — ED Provider Notes (Signed)
[facility] EMERGENCY DEPARTMENT Provider Note   CSN: [id number] Arrival date & time: 05/19/22  1222     History  Chief Complaint  Patient presents with   Vaginal Bleeding    Lindsey Collins is a 76 y.o. female.  HPI Patient presents from nursing facility with staff concern for lack of outpatient follow-up after episode of vaginal bleeding that occurred last week with work-up that included CT concerning for possible endometrial thickening/malignancy.  History is obtained by EMS who brings patient here, and nursing home staff with whom I discussed the case on the phone.  They note no interval complaints, changes, additional bleeding episodes.  They note that in attempting to do follow-up with our cancer center they were unable to do so due to the patient's vented status.  The patient still denies complaints, by nodding, whispering quietly, which is typical for her.    Home Medications Prior to Admission medications   Medication Sig Start Date End Date Taking? Authorizing Provider  acetaminophen (TYLENOL) 325 MG tablet Place 650 mg into feeding tube every 6 (six) hours as needed for moderate pain.    [provider]  acyclovir (ZOVIRAX) 800 MG tablet Take 800 mg by mouth every 6 (six) hours. For 7 days, via tube 07/04/21   [provider]  allopurinol (ZYLOPRIM) 100 MG tablet Place 200 mg into feeding tube daily.    [provider]  atorvastatin (LIPITOR) 20 MG tablet Place 20 mg into feeding tube at bedtime. 11/26/20   [provider]  carbidopa-levodopa (SINEMET IR) 25-100 MG tablet Place 1 tablet into feeding tube every 6 (six) hours. Times given : 00:00, 0600; 12:00, 1800 10/28/20   [provider]  chlorhexidine (PERIDEX) 0.12 % solution Use as directed 15 mLs in the mouth or throat 2 (two) times daily.    [provider]  cholecalciferol (VITAMIN D3) 25 MCG (1000 UNIT) tablet Place 1,000 Units into feeding tube  daily.    [provider]  cloNIDine (CATAPRES) 0.1 MG tablet Place 0.1 mg into feeding tube every 8 (eight) hours as needed (SBP >180).    [provider]  divalproex (DEPAKOTE SPRINKLE) 125 MG capsule 500 mg 2 (two) times daily. Via tube 11/24/20   [provider]  doxycycline (VIBRAMYCIN) 100 MG capsule Take 1 capsule (100 mg total) by mouth 2 (two) times daily. One po bid x 7 days 05/11/22   Milton Ferguson, MD  FLUoxetine (PROZAC) 10 MG tablet Place 10 mg into feeding tube daily. 11/08/20   [provider]  Glucagon, rDNA, (GLUCAGON EMERGENCY) 1 MG KIT Inject 1 mg as directed as needed (blood sugar less than 70).    [provider]  insulin aspart (NOVOLOG) 100 UNIT/ML injection Inject 0-10 Units into the skin every 6 (six) hours. Sliding scale: CBG 150 = 0 units, 151-200 = 2 units, 201-250 = 4 units, 251-300 = 6 units, 301-350 = 8 units, 351-400 = 10 units.    [provider]  insulin glargine (LANTUS) 100 UNIT/ML injection Inject 0.3 mLs (30 Units total) into the skin at bedtime. Patient taking differently: Inject 30 Units into the skin 2 (two) times daily. 03/26/21   Minor, Grace Bushy, NP  ipratropium-albuterol (DUONEB) 0.5-2.5 (3) MG/3ML SOLN Take 3 mLs by nebulization every 6 (six) hours.    [provider]  metoprolol tartrate (LOPRESSOR) 25 MG tablet Place 12.5 mg into feeding tube 2 (two) times daily.    [provider]  Nutritional Supplements (FEEDING SUPPLEMENT, GLUCERNA 1.5 CAL,) LIQD Place 50 mLs into feeding tube continuous. 50ms per hour    [provider]  OLANZapine (ZYPREXA) 15 MG tablet Place 15 mg into feeding tube at bedtime.    [provider]  ondansetron (ZOFRAN) 4 MG/2ML SOLN injection Inject 4 mg into the vein every 6 (six) hours as needed for nausea or vomiting.    [provider]  ondansetron (ZOFRAN) 8 MG tablet Place 8 mg into feeding tube every 8 (eight) hours as needed for  nausea or vomiting.    [provider]  pantoprazole sodium (PROTONIX) 40 mg/20 mL PACK Place 40 mg into feeding tube 2 (two) times daily.    [provider]  polyethylene glycol (MIRALAX / GLYCOLAX) 17 g packet Place 17 g into feeding tube daily as needed for mild constipation.    [provider]  pramipexole (MIRAPEX) 0.125 MG tablet Place 0.125 mg into feeding tube every 8 (eight) hours. 11/05/20   [provider]  propylthiouracil (PTU) 50 MG tablet Place 50 mg into feeding tube every 12 (twelve) hours. 10/19/20   [provider]  Semaglutide, 1 MG/DOSE, (OZEMPIC, 1 MG/DOSE,) 2 MG/1.5ML SOPN Inject 1 mg into the skin once a week. Thursday's    [provider]      Allergies    Patient has no known allergies.    Review of Systems   Review of Systems  All other systems reviewed and are negative.   Physical Exam Updated Vital Signs BP 114/70   Pulse (!) 106   Temp 98.2 F (36.8 C) (Oral)   Resp (!) 26   Ht '5\' 4"'  (1.626 m)   Wt 89.4 kg   SpO2 100%   BMI 33.81 kg/m  Physical Exam Vitals and nursing note reviewed. Exam conducted with a chaperone present.  Constitutional:      General: She is not in acute distress.    Appearance: She is well-developed. She is obese. She is ill-appearing. She is not toxic-appearing or diaphoretic.  HENT:     Head: Normocephalic and atraumatic.  Eyes:     Conjunctiva/sclera: Conjunctivae normal.  Neck:     Comments: Tracheostomy unremarkable Cardiovascular:     Rate and Rhythm: Normal rate and regular rhythm.  Pulmonary:     Effort: Pulmonary effort is normal. No respiratory distress.     Breath sounds: Normal breath sounds. No stridor.  Abdominal:     General: There is no distension.     Tenderness: There is no abdominal tenderness. There is no guarding.  Genitourinary:    General: Normal vulva.    Skin:    General: Skin is warm and dry.  Neurological:     Mental Status: She is alert  and oriented to person, place, and time.     Cranial Nerves: No cranial nerve deficit.     Comments: Patient cannot phonate due to prior tracheostomy, otherwise cranial nerves unremarkable.  She moves all extremities minimally, but participates in conversation, is grossly neurologically at baseline.  Psychiatric:        Mood and Affect: Mood normal.     ED Results / Procedures / Treatments   Labs (all labs ordered are listed, but only abnormal results are displayed) Labs Reviewed  BASIC METABOLIC PANEL - Abnormal; Notable for the following components:      Result Value   Chloride 96 (*)    CO2 37 (*)    Glucose, Bld 157 (*)  All other components within normal limits  CBC WITH DIFFERENTIAL/PLATELET - Abnormal; Notable for the following components:   Hemoglobin 11.3 (*)    All other components within normal limits  POC OCCULT BLOOD, ED    EKG None  Radiology No results found.  Procedures Procedures    Medications Ordered in ED Medications - No data to display  ED Course/ Medical Decision Making/ A&P This patient with a Hx of Parkinson's, bedbound status, vent dependent presents to the ED for concern of vaginal bleeding 1 week ago, absence of outpatient follow-up, this involves an extensive number of treatment options, and is a complaint that carries with it a high risk of complications and morbidity.    The differential diagnosis includes ongoing bleeding, progression of malignancy, concurrent infection   Social Determinants of Health:  Nearly nonverbal status, ventilator dependency, long-term nursing home resident  Additional history obtained:  Additional history and/or information obtained from as above, chart and nursing home staff.   After the initial evaluation, orders, including: Labs were initiated.   Patient placed on Cardiac and Pulse-Oximetry Monitors. The patient was maintained on a cardiac monitor.  The cardiac monitored showed an rhythm of 105  sinus tach borderline The patient was also maintained on pulse oximetry. The readings were typically 99% with vent, unremarkable   On repeat evaluation of the patient stayed the same  Lab Tests:  I personally interpreted labs.  The pertinent results include: Labs essentially unchanged from last week, Hemoccult negative stool  Imaging Studies ordered:  I independently visualized and interpreted imaging which showed CT from last week reviewed again, consistent with endometrial thickening, and hepatic lesions I agree with the radiologist interpretation  Consultations Obtained:  I requested consultation with the gynecology and gynecology oncology,  and discussed lab and imaging findings as well as pertinent plan - they recommend: After thorough conversation with multiple clinicians, colleagues, patient will have evaluation by gynecology in the ED, with consideration of endometrial biopsy which can be followed as an outpatient.  No indication for admission for additional inpatient studies today as the patient is hemodynamically unremarkable, at baseline, with labs that are also unremarkable, at baseline.  Dispostion / Final MDM:  After consideration of the diagnostic results and the patient's response to treatment, this adult female with multiple medical issues, most prominently event/trach dependent, bedbound secondary to Parkinson's and chronic weakness now with ongoing concern for vaginal bleeding though she has had none of this since last week but does have abnormal CT scan requiring additional studies.  After discussion with nursing home staff, gynecology, gynecology oncology, the patient will be evaluated in the ED, likely return to her nursing facility for following of those results as an outpatient.  No evidence for other acute new phenomenon, without new oxygen requirement, without new work of breathing improvement, without new fever.  On sign-out the patient is awaiting eval by Dr.  Elly Modena.  Final Clinical Impression(s) / ED Diagnoses Final diagnoses:  Vaginal bleeding     Carmin Muskrat, MD 05/19/22 [phone]

## 2022-05-19 NOTE — ED Provider Notes (Signed)
5:27 PM Dr. Elly Modena has seen patient and done biopsy. However she is asking for pelvic ultrasound prior to d/c back to facility. Will order this and d/c after complete.  7:08 PM U/S completed. Thickened endometrial stripe as expected. D/c back to facility for further outpatient workup. Referred to gyn-onc   Sherwood Gambler, MD 05/19/22 [phone]

## 2022-05-19 NOTE — Discharge Instructions (Addendum)
As discussed, you have had an evaluation in the emergency department due to your prior vaginal bleeding and abnormal CT results from last week.  These results will be available in the coming days, and our gynecology/oncology team is aware of them, and will contact you for appropriate ongoing outpatient management.  Return here for concerning changes in your condition.

## 2022-05-19 NOTE — ED Notes (Signed)
[facility] called for transport. 

## 2022-05-19 NOTE — ED Notes (Signed)
Got patient on the monitor placed a external cath help clean patient up patient is resting with call bell in reach

## 2022-05-19 NOTE — Consult Note (Addendum)
Reason for Consult:postmenopausal vaginal bleeding   Lindsey Collins is an 76 y.o. female P2 postmenopausal since the age of 75 presenting to the ED for the evaluation of postmenopausal vaginal bleeding. Patient was seen in the ED on 05/11/22 for the evaluation of bright red blood noted on her underwear. Patient denies any further episodes of vaginal bleeding since her ED visit. She denies pelvic pain. She denies any passage pf clots. She denies chest pain or lightheadedness or dizziness. 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 Patient is not on any blood thinner. Although patient cannot phonate, she is is engaged in the conversation as she mouths words and answers questions appropriately. Patient returns today to follow up on vaginal bleeding as outpatient follow up was not scheduled  Pertinent Gynecological History: Menses: post-menopausal Bleeding: post menopausal bleeding  Menstrual History:  No LMP recorded. Patient is postmenopausal.    Past Medical History:  Diagnosis Date   Acute on chronic respiratory failure with hypoxia (HCC)    ARDS (adult respiratory distress syndrome) (HCC)    Chronic kidney disease, stage II (mild)    MRSA (methicillin resistant staphylococcus aureus) pneumonia (HCC)    Obstructive chronic bronchitis without exacerbation (HCC)     Past Surgical History:  Procedure Laterality Date   TRACHEOSTOMY      Family History  Problem Relation Age of Onset   CAD Mother        hx CABG    Social History:  reports that she has never smoked. She has never used smokeless tobacco. No history on file for alcohol use and drug use.  Allergies: No Known Allergies  Medications: I have reviewed the patient's current medications.  Review of Systems See pertinent in HPI. All other systems reviewed and non contributory Blood pressure 114/70, pulse (!) 106, temperature 98.2 F (36.8  C), temperature source Oral, resp. rate (!) 26, height '5\' 4"'$  (1.626 m), weight 89.4 kg, SpO2 100 %. Physical Exam GENERAL: Well-developed, well-nourished female in no acute distress.  HEENT: Normocephalic, atraumatic. Sclerae anicteric.  NECK: Supple. Trach tube in place ABDOMEN: Soft, nontender, nondistended. No organomegaly. PELVIC:  Normal external female genitalia. Vagina is pale and rugated.  Normal discharge. Normal appearing cervix. Uterus is normal in size. No adnexal mass or tenderness. Chaperone present during the pelvic exam EXTREMITIES: No cyanosis, clubbing, or edema, 2+ distal pulses.   Results for orders placed or performed during the hospital encounter of 05/19/22 (from the past 48 hour(s))  Basic metabolic panel     Status: Abnormal   Collection Time: 05/19/22  1:23 PM  Result Value Ref Range   Sodium 139 135 - 145 mmol/L   Potassium 3.9 3.5 - 5.1 mmol/L   Chloride 96 (L) 98 - 111 mmol/L   CO2 37 (H) 22 - 32 mmol/L   Glucose, Bld 157 (H) 70 - 99 mg/dL    Comment: Glucose reference range applies only to samples taken after fasting for at least 8 hours.   BUN 14 8 - 23 mg/dL   Creatinine, Ser 0.48 0.44 - 1.00 mg/dL   Calcium 9.1 8.9 - 10.3 mg/dL   GFR, Estimated >60 >60 mL/min    Comment: (NOTE) Calculated using the CKD-EPI Creatinine Equation (2021)    Anion gap 6 5 - 15    Comment: Performed at [facility] [address], [place], [place] [address]  CBC with Differential  Status: Abnormal   Collection Time: 05/19/22  1:23 PM  Result Value Ref Range   WBC 8.4 4.0 - 10.5 K/uL   RBC 3.90 3.87 - 5.11 MIL/uL   Hemoglobin 11.3 (L) 12.0 - 15.0 g/dL   HCT 36.2 36.0 - 46.0 %   MCV 92.8 80.0 - 100.0 fL   MCH 29.0 26.0 - 34.0 pg   MCHC 31.2 30.0 - 36.0 g/dL   RDW 14.6 11.5 - 15.5 %   Platelets 260 150 - 400 K/uL   nRBC 0.0 0.0 - 0.2 %   Neutrophils Relative % 69 %   Neutro Abs 5.7 1.7 - 7.7 K/uL   Lymphocytes Relative 23 %   Lymphs Abs 2.0 0.7 - 4.0 K/uL    Monocytes Relative 6 %   Monocytes Absolute 0.5 0.1 - 1.0 K/uL   Eosinophils Relative 2 %   Eosinophils Absolute 0.2 0.0 - 0.5 K/uL   Basophils Relative 0 %   Basophils Absolute 0.0 0.0 - 0.1 K/uL   Immature Granulocytes 0 %   Abs Immature Granulocytes 0.03 0.00 - 0.07 K/uL    Comment: Performed at [facility] [address] [place], [place] [address]  POC occult blood, ED     Status: None   Collection Time: 05/19/22  1:36 PM  Result Value Ref Range   Fecal Occult Bld NEGATIVE NEGATIVE    No results found.  DG Chest Port 1 View  Result Date: 05/11/2022 CLINICAL DATA:  A 76 year old female presents with possible pneumonia. EXAM: PORTABLE CHEST 1 VIEW COMPARISON:  March 26, 2021 FINDINGS: EKG leads project over the chest. Tracheostomy tube terminates in the proximal to mid trachea. RIGHT-sided PICC line in the area of the caval to atrial junction with respect to tip placement. Cardiomediastinal contours with mild cardiomegaly accentuated by AP projection and portable technique. Hilar structures are normal. No lobar consolidation but with slight increased density in the retrocardiac region but without obscured LEFT hemidiaphragm potentially in part due to overlapping soft tissues. No pneumothorax. On limited assessment no acute skeletal findings. IMPRESSION: 1. Basilar airspace process better seen on recent abdominal CT imaging with some basilar consolidation on the LEFT and sequela of prior aspiration. Findings on the CT raise the question of acute on chronic aspiration related changes. Electronically Signed   By: Zetta Bills M.D.   On: 05/11/2022 17:17   CT Abdomen Pelvis W Contrast  Result Date: 05/11/2022 CLINICAL DATA:  A 76 year old female presents for evaluation of abdominal pain, blood clots in underwear/briefs while changing. EXAM: CT ABDOMEN AND PELVIS WITH CONTRAST TECHNIQUE: Multidetector CT imaging of the abdomen and pelvis was performed using the standard protocol  following bolus administration of intravenous contrast. RADIATION DOSE REDUCTION: This exam was performed according to the departmental dose-optimization program which includes automated exposure control, adjustment of the mA and/or kV according to patient size and/or use of iterative reconstruction technique. CONTRAST:  163m OMNIPAQUE IOHEXOL 300 MG/ML  SOLN COMPARISON:  July 09, 2021 FINDINGS: Lower chest: LEFT lower lobe airspace disease with consolidation with increased consolidation in the superior segment of the LEFT lower lobe and mild ground-glass in this area. Material in RIGHT lower lobe bronchi. Patchy airspace process at the RIGHT lung base as well. No signs of pleural effusion. Central venous access device terminates at the caval to atrial junction. Heart size normal. Ovoid density in the anterior chest wall 20 x 12 mm (image 12/2) No pericardial effusion.  Heart is incompletely imaged. Hepatobiliary: Nodular  hepatic contours with signs of background hepatic steatosis. Numerous focal areas of low attenuation have developed in the liver since previous imaging. (Image 25/2) lesion at the margin of the RIGHT hemiliver 13-14 mm. Lesion near the RIGHT portal pedicle 7 mm. Low-attenuation focus in hepatic subsegment VI (image 32/2) 14 mm. (Image 9/2) hepatic subsegment VIII, 19 mm. Potential small lesions in the LEFT hepatic lobe as well. At least 8-10 additional focal areas of low attenuation. The portal vein is widely patent. Hepatic veins are patent. Lobular hepatic contours. Fissural widening and mild caudate hypertrophy. Query background hepatic steatosis. Pancreas: Mild pancreatic atrophy without signs of inflammation or ductal dilation. Spleen: Normal. Adrenals/Urinary Tract: Adrenal glands are normal. Symmetric renal enhancement without hydronephrosis. Cyst in the upper pole the RIGHT kidney for which no additional dedicated imaging follow-up is recommended measuring 2.3 cm. No ureteral dilation.  No suspicious renal lesion. Cortical scarring of the lower pole of the LEFT kidney is a stable finding. Stomach/Bowel: Gastrostomy tube remains in place. No stranding about the stomach. No sign of small bowel obstruction. Signs of prior partial colonic resection. No evidence of colonic thickening or pericolonic stranding. Vascular/Lymphatic: Aortic atherosclerosis. No sign of aneurysm. Smooth contour of the IVC. There is no gastrohepatic or hepatoduodenal ligament lymphadenopathy. No retroperitoneal or mesenteric lymphadenopathy. No pelvic sidewall lymphadenopathy. Reproductive: Thickening of the endometrium with heterogeneity up to 8 mm is similar to prior imaging. Other: No ascites.  No pneumoperitoneum. Musculoskeletal: Osteopenia with spinal degenerative changes. No acute or destructive bone process. Degenerative changes of the hips. IMPRESSION: 1. Numerous focal areas of low attenuation have developed in the liver since previous imaging. Findings are concerning for metastatic disease. Differential includes multifocal nodular hepatic steatosis as a diagnosis of exclusion, as such hepatic MRI with and without contrast, preferably as an outpatient with the patient is better able to hold their breath and cooperate with instructions is suggested for further evaluation. 2. Thickened and heterogeneous endometrium. Would correlate with vaginal bleeding which could explain the presence of blood in the patient's underwear/briefs. The possibility of gynecologic malignancy is considered given constellation of findings outlined above. Evaluation with pelvic sonogram may be helpful for further assessment with gyn follow-up as warranted. 3. Sequela of chronic aspiration at the LEFT lung base with material in RIGHT lower lobe bronchi potentially aspirated material or inspissated secretions. 4. Ovoid density in the anterior chest wall likely a sebaceous cyst. Correlation with direct physical examination may be helpful. 5. Signs  of prior partial colonic resection. 6. Aortic atherosclerosis. Aortic Atherosclerosis (ICD10-I70.0). Electronically Signed   By: Zetta Bills M.D.   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 Patient verbalized understanding and signed consent. - The indications for endometrial biopsy were reviewed.   Risks of the biopsy including cramping, bleeding, infection, uterine perforation, inadequate specimen and need for additional procedures  were discussed. The patient states she understands and agrees to undergo procedure today. Consent was signed. Time out was performed. Urine HCG was negative. A sterile speculum was placed in the patient's vagina and the cervix was prepped with Betadine. A single-toothed tenaculum was placed on the anterior lip of the cervix to stabilize it. The uterine cavity was sounded to a depth  of 10 cm using the uterine sound. The 3 mm pipelle was introduced into the endometrial cavity without difficulty, 1 pass was made.  A  moderate amount of tissue was  sent to pathology. The instruments were removed from the patient's vagina. Minimal bleeding from the cervix was noted. The patient tolerated the procedure well.  Routine post-procedure instructions were given to the patient.  The patient will be contacted with the results and for further management.    Would order pelvic ultrasound while patient is here to complete the evaluation of postmenopausal vaginal bleeding prior to discharge to long term care facility  Attempted to contact patient's daughter, Mariaceleste Herrera, to inform her that an endometrial biopsy was performed. Call went straight to voicemail. Patient gave verbal  consent to inform Kayonna Lawniczak of the endometrial biopsy results when they become available   Tanaiya Kolarik 05/19/2022

## 2022-05-22 ENCOUNTER — Other Ambulatory Visit: Payer: Self-pay | Admitting: Obstetrics and Gynecology

## 2022-05-22 LAB — SURGICAL PATHOLOGY

## 2022-05-22 MED ORDER — MEGESTROL ACETATE 40 MG PO TABS: 40.0000 mg | ORAL_TABLET | Freq: Every day | ORAL | 6 refills | Status: AC

## 2022-05-22 NOTE — Progress Notes (Signed)
Daughter, Liliya Fullenwider, returned my phone call. Endometrial biopsy results were reviewed and explained. Discussed medical management with megace or definitive management with hysterectomy. Patient's daughter has a preference for medical management and plans to discuss these options with her mother this weekend.  I contacted the long term care facility where the patient resides and spoke to her nurse. She provided me information for preferred pharmacy. Rx Megace was e-prescribed to [facility].  Discussed the need for surveillance and response to medication with repeat endometrial biopsy in 6 months. They will discuss with PCP who rounds on patients weekly

## 2022-05-22 NOTE — Progress Notes (Signed)
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  Will continue to try to reach her next week to discuss medical management with Megace vs definitive management with hysterectomy for which a referral to GYN ON[facility] needs to be made

## 2022-07-08 LAB — LEGIONELLA PNEUMOPHILA SEROGP 1 UR AG: L. pneumophila Serogp 1 Ur Ag: NEGATIVE

## 2022-07-11 IMAGING — DX DG CHEST 1V PORT
1 series · 1 of 1 positions shown · non-contrast
Comparison: 12/21/2020

CLINICAL DATA: Questionable sepsis.  Unresponsive patient.

EXAM:
PORTABLE CHEST 1 VIEW

[chest]
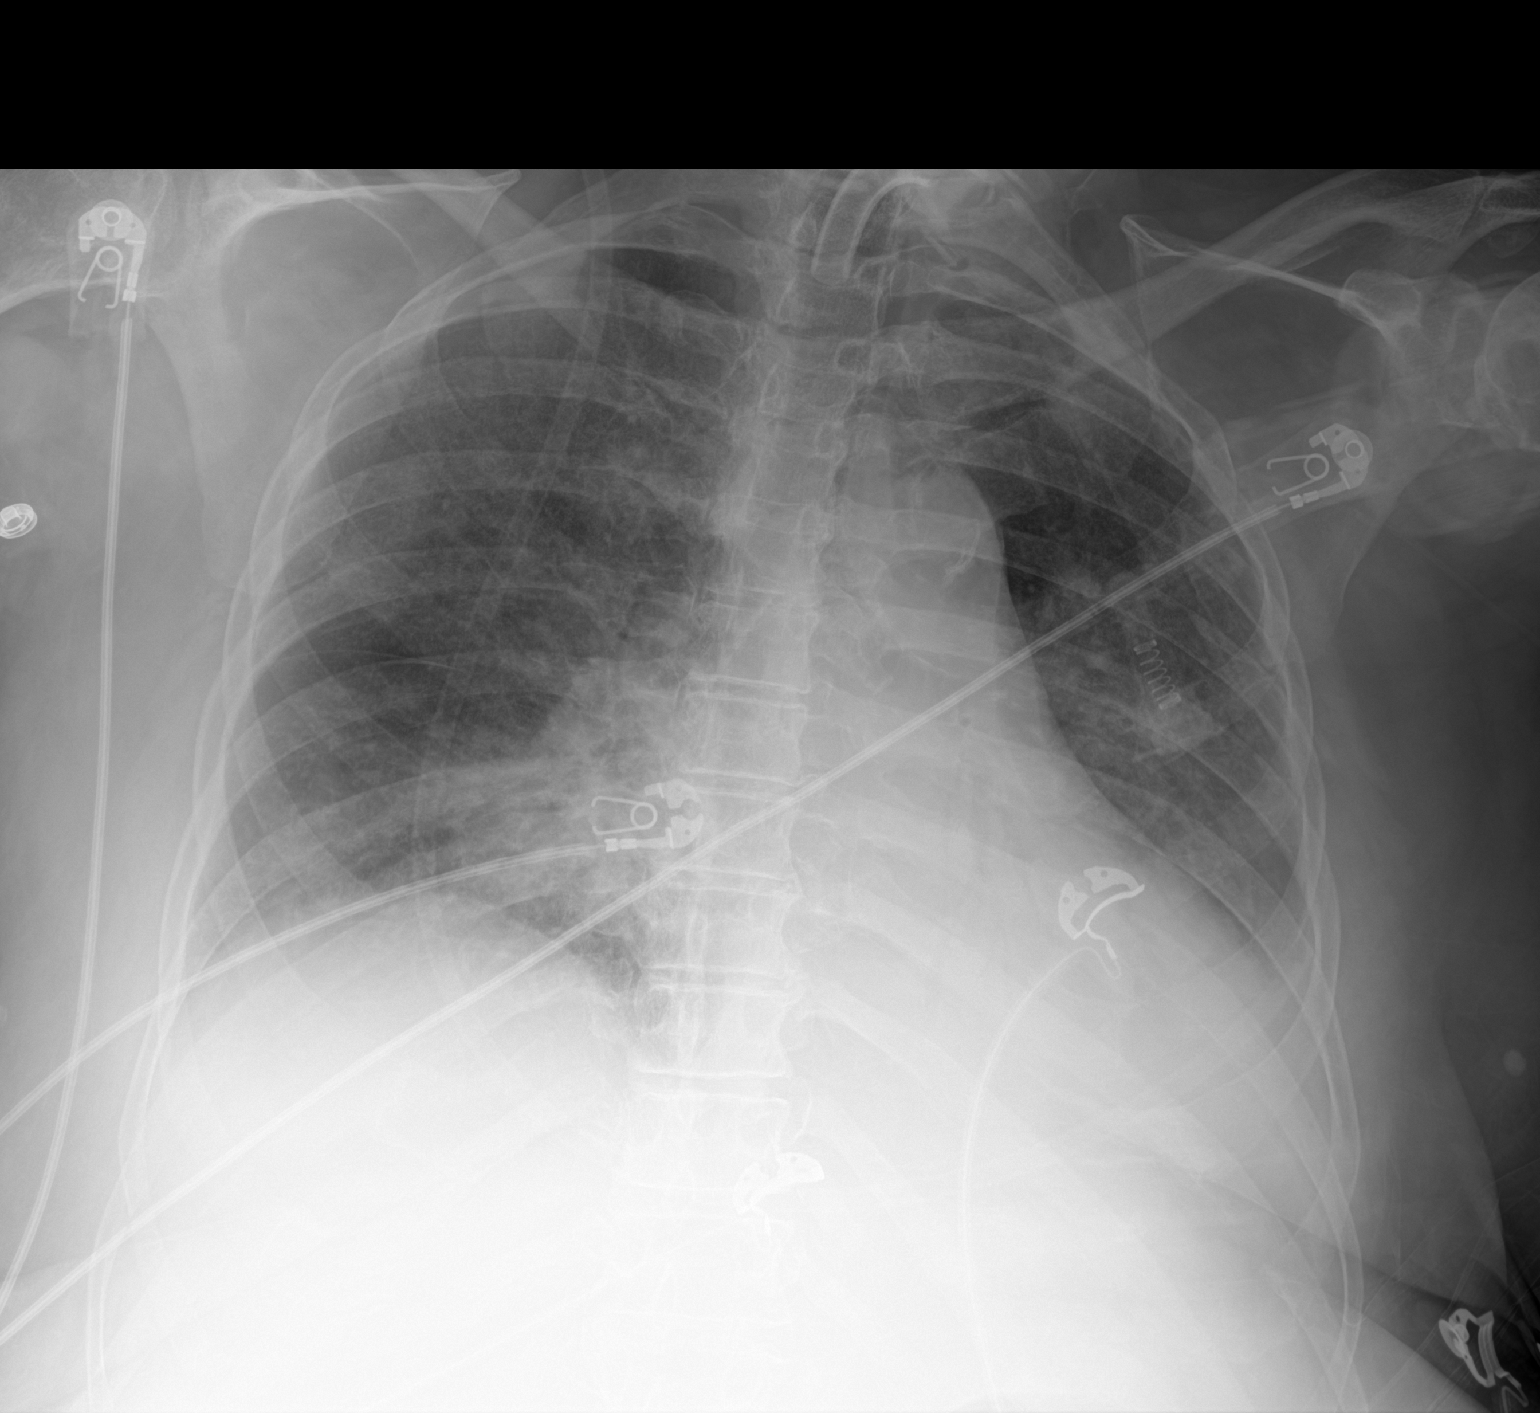

[1 of 1 positions shown; findings below may reference images not displayed]

FINDINGS: Tracheostomy tube. Mild cardiac enlargement. Right perihilar opacity
likely representing pneumonia but follow-up to resolution
recommended to exclude underlying mass lesion. Air bronchograms
behind the left heart suggesting consolidation or atelectasis.
Probable small left pleural effusion. Calcification of the aorta.
IMPRESSION: Right perihilar opacity likely representing pneumonia but follow-up
to resolution recommended to exclude underlying mass lesion.
Consolidation or atelectasis in the left lung base with small left
pleural effusion.

## 2022-07-11 IMAGING — CT CT HEAD W/O CM
4 series · 17 of 47 positions shown, 19 images · non-contrast
Comparison: None.

CLINICAL DATA: Delirium

EXAM:
CT HEAD WITHOUT CONTRAST
TECHNIQUE: Contiguous axial images were obtained from the base of the skull
through the vertex without intravenous contrast.

[Series 3: head without · axial · non-contrast · 0.43mm/px · z∈[-83,+37]mm · 7 of 34 slices shown, 9 images]
[im 5/34  brain]
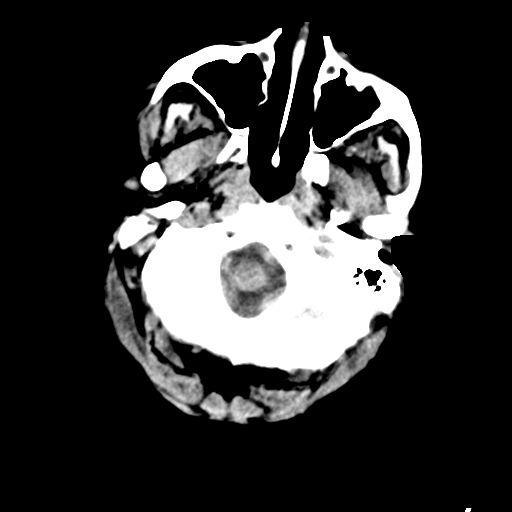
[im 5/34  bone]
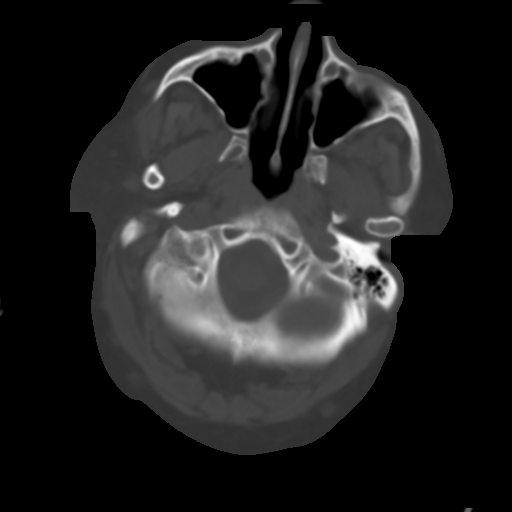
[im 9/34  brain]
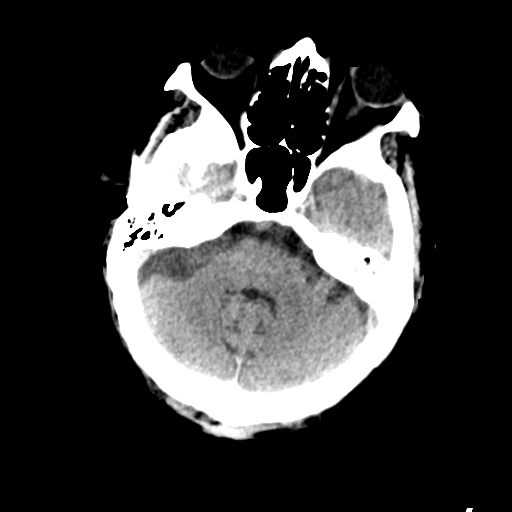
[im 13/34  brain]
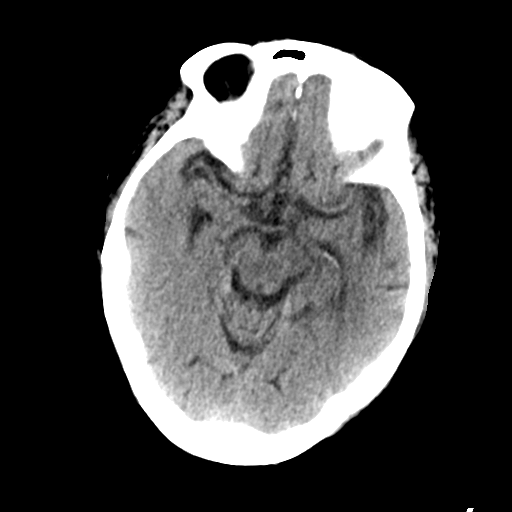
[im 17/34  brain]
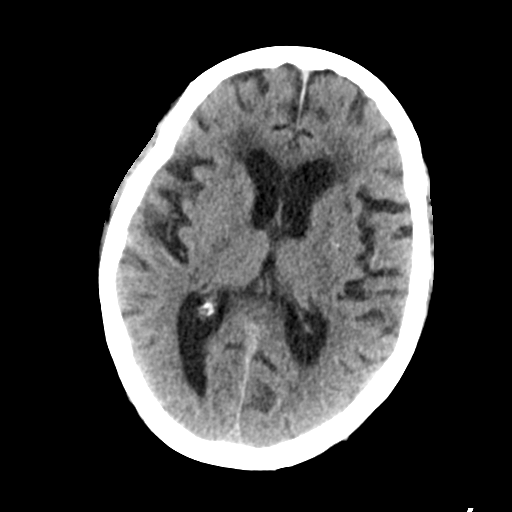
[im 21/34  brain]
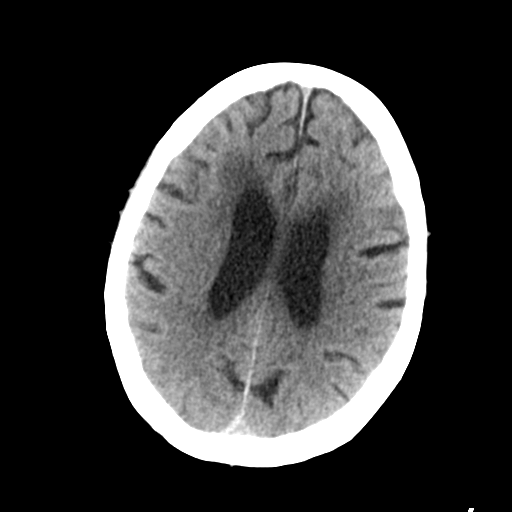
[im 21/34  bone]
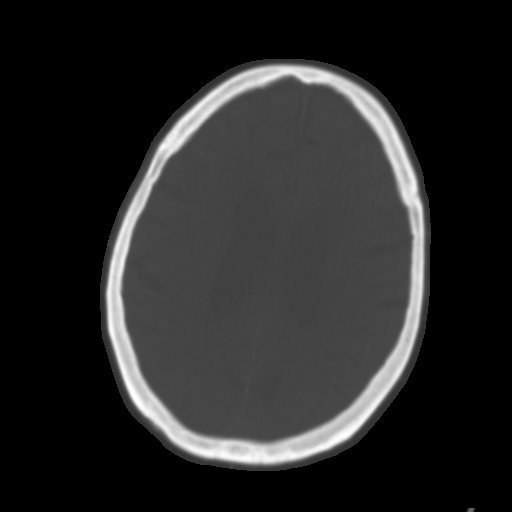
[im 25/34  brain]
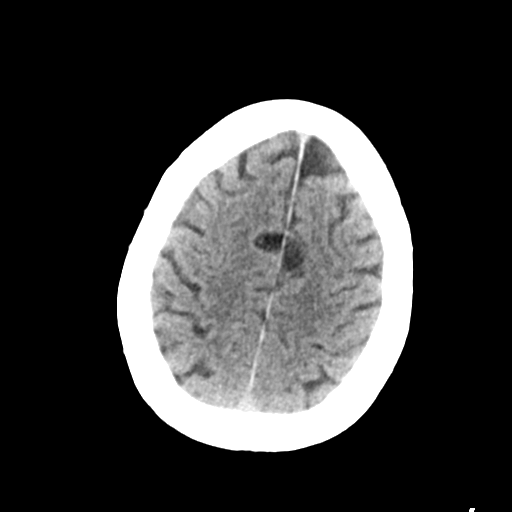
[im 29/34  brain]
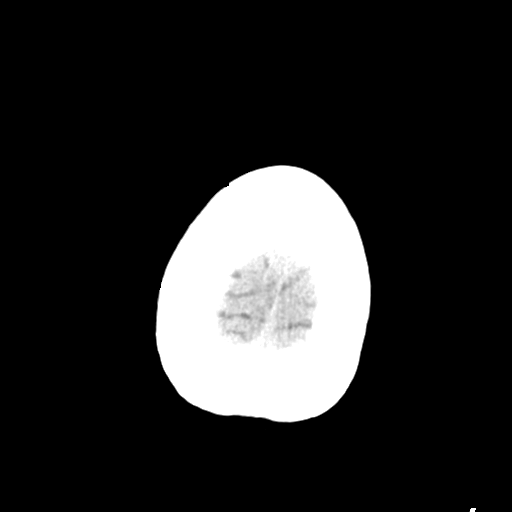

[Series 4: head bone · axial · 0.43mm/px · z∈[-87,-29]mm · 4 of 84 slices shown]
[im 9/84  bone]
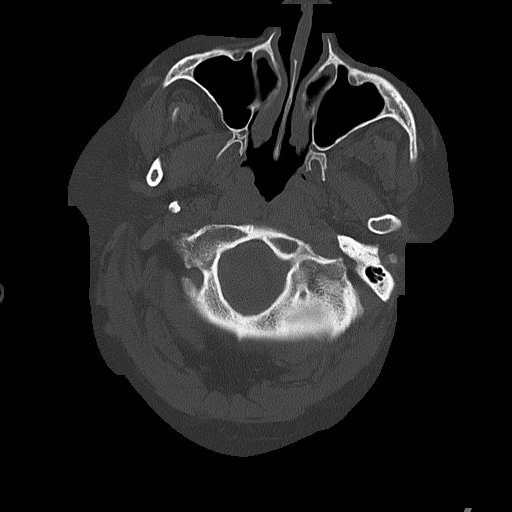
[im 17/84  bone]
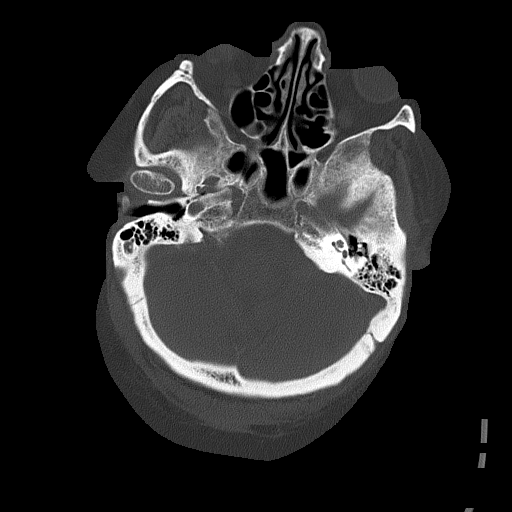
[im 25/84  bone]
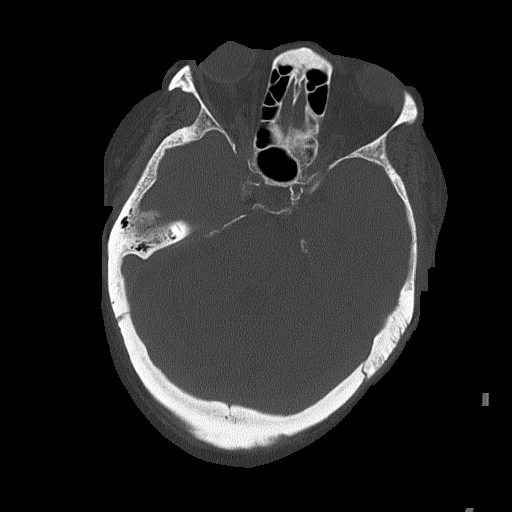
[im 38/84  bone]
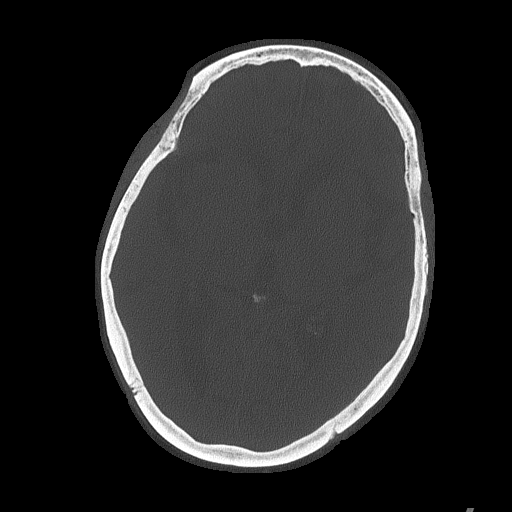

[Series 5: head without cor · coronal · non-contrast · 0.33mm/px · 3 of 67 slices shown]
[im 23/67  brain]
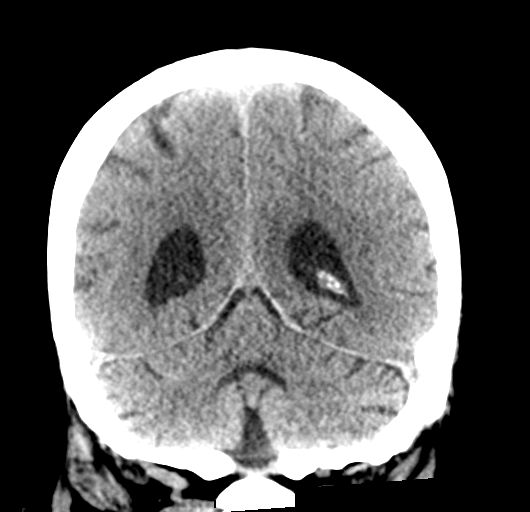
[im 30/67  brain]
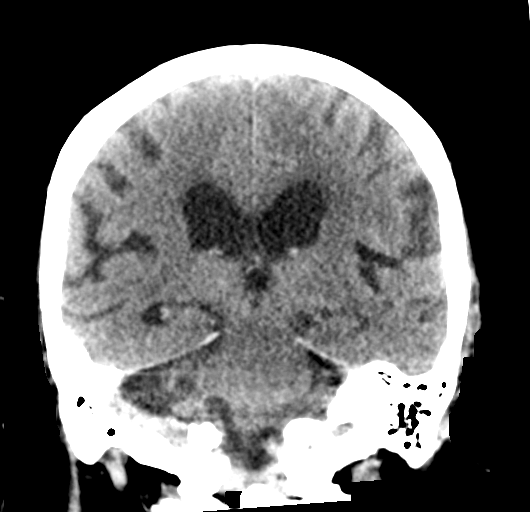
[im 37/67  brain]
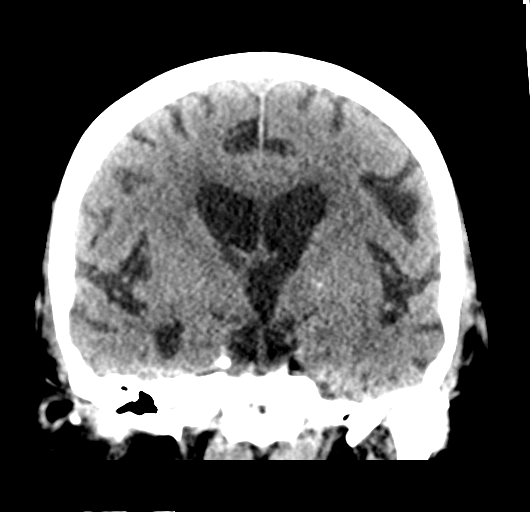

[Series 6: head without sag · sagittal · non-contrast · 0.33mm/px · 3 of 53 slices shown]
[im 18/53  brain]
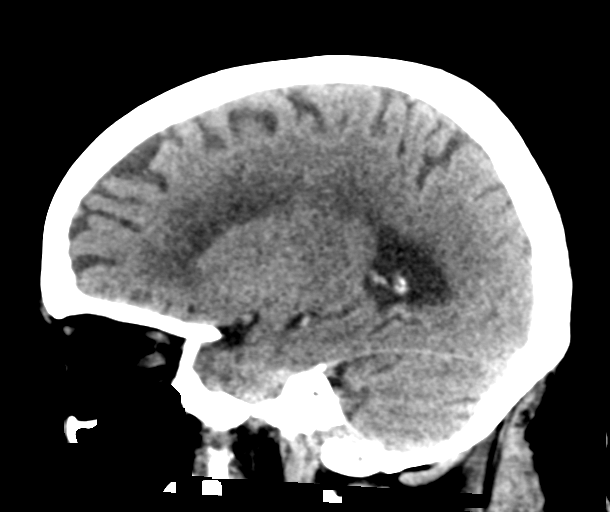
[im 27/53  brain]
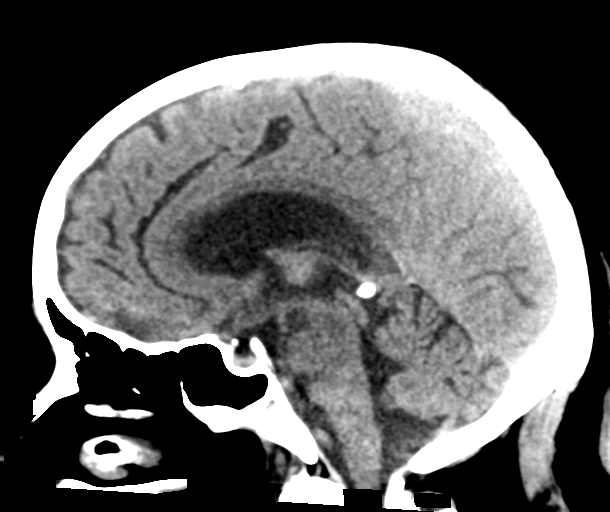
[im 35/53  brain]
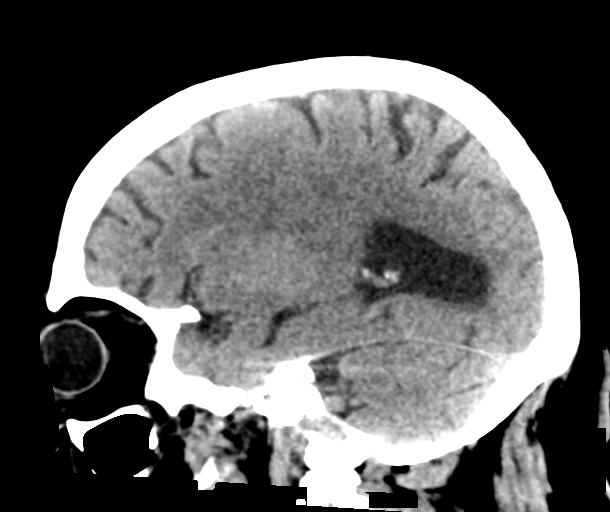

[17 of 47 positions shown; findings below may reference images not displayed]

FINDINGS: Brain: Normal anatomic configuration. Parenchymal volume loss is
commensurate with the patient's age. Mild periventricular white
matter changes are present likely reflecting the sequela of small
vessel ischemia. Remote lacunar infarct is noted within the left
subinsular white matter. No abnormal intra or extra-axial mass
lesion or fluid collection. No abnormal mass effect or midline
shift. No evidence of acute intracranial hemorrhage or infarct.
Ventricular size is normal. Cerebellum unremarkable.

Vascular: No asymmetric hyperdense vasculature at the skull base.

Skull: Intact

Sinuses/Orbits: Paranasal sinuses are clear. Orbits are
unremarkable.

Other: Mastoid air cells and middle ear cavities are clear.
IMPRESSION: No acute intracranial abnormality.

Mild senescent change.  Remote left subinsular lacunar infarct.

## 2022-09-15 DIAGNOSIS — D649 Anemia, unspecified: Secondary | ICD-10-CM | POA: Diagnosis not present

## 2022-09-15 DIAGNOSIS — I5032 Chronic diastolic (congestive) heart failure: Secondary | ICD-10-CM | POA: Diagnosis not present

## 2022-09-15 DIAGNOSIS — I4891 Unspecified atrial fibrillation: Secondary | ICD-10-CM

## 2022-09-15 DIAGNOSIS — J9621 Acute and chronic respiratory failure with hypoxia: Secondary | ICD-10-CM | POA: Diagnosis not present

## 2022-09-15 DIAGNOSIS — G20A1 Parkinson's disease without dyskinesia, without mention of fluctuations: Secondary | ICD-10-CM | POA: Diagnosis not present

## 2022-09-22 ENCOUNTER — Other Ambulatory Visit: Payer: Self-pay

## 2022-09-22 ENCOUNTER — Encounter (HOSPITAL_COMMUNITY): Payer: Self-pay | Admitting: Emergency Medicine

## 2022-09-22 ENCOUNTER — Emergency Department (HOSPITAL_COMMUNITY): Payer: Medicare Other

## 2022-09-22 ENCOUNTER — Emergency Department: Payer: Self-pay

## 2022-09-22 ENCOUNTER — Emergency Department (HOSPITAL_COMMUNITY)
Admission: EM | Admit: 2022-09-22 | Discharge: 2022-09-22 | Disposition: A | Discharge status: Home / Self Care | Payer: Medicare Other | Attending: Emergency Medicine | Admitting: Emergency Medicine

## 2022-09-22 DIAGNOSIS — Z930 Tracheostomy status: Secondary | ICD-10-CM | POA: Diagnosis not present

## 2022-09-22 DIAGNOSIS — J189 Pneumonia, unspecified organism: Secondary | ICD-10-CM

## 2022-09-22 DIAGNOSIS — T82524A Displacement of infusion catheter, initial encounter: Secondary | ICD-10-CM | POA: Diagnosis not present

## 2022-09-22 DIAGNOSIS — Y848 Other medical procedures as the cause of abnormal reaction of the patient, or of later complication, without mention of misadventure at the time of the procedure: Secondary | ICD-10-CM | POA: Diagnosis not present

## 2022-09-22 DIAGNOSIS — R0603 Acute respiratory distress: Secondary | ICD-10-CM | POA: Diagnosis present

## 2022-09-22 DIAGNOSIS — I2510 Atherosclerotic heart disease of native coronary artery without angina pectoris: Secondary | ICD-10-CM | POA: Diagnosis not present

## 2022-09-22 DIAGNOSIS — Y95 Nosocomial condition: Secondary | ICD-10-CM | POA: Insufficient documentation

## 2022-09-22 DIAGNOSIS — I700 Atherosclerosis of aorta: Secondary | ICD-10-CM | POA: Diagnosis not present

## 2022-09-22 DIAGNOSIS — Z1152 Encounter for screening for COVID-19: Secondary | ICD-10-CM | POA: Insufficient documentation

## 2022-09-22 DIAGNOSIS — R000 Tachycardia, unspecified: Secondary | ICD-10-CM | POA: Insufficient documentation

## 2022-09-22 DIAGNOSIS — Z95828 Presence of other vascular implants and grafts: Secondary | ICD-10-CM

## 2022-09-22 LAB — COMPREHENSIVE METABOLIC PANEL
ALT: 9 U/L (ref 0–44)
AST: 22 U/L (ref 15–41)
Albumin: 3.3 g/dL — ABNORMAL LOW (ref 3.5–5.0)
Alkaline Phosphatase: 57 U/L (ref 38–126)
Anion gap: 10 (ref 5–15)
BUN: 16 mg/dL (ref 8–23)
CO2: 27 mmol/L (ref 22–32)
Calcium: 9.1 mg/dL (ref 8.9–10.3)
Chloride: 97 mmol/L — ABNORMAL LOW (ref 98–111)
Creatinine, Ser: 0.8 mg/dL (ref 0.44–1.00)
GFR, Estimated: >60 mL/min (ref >60)
Glucose, Bld: 306 mg/dL — ABNORMAL HIGH (ref 70–99)
Potassium: 5.8 mmol/L — ABNORMAL HIGH (ref 3.5–5.1)
Sodium: 134 mmol/L — ABNORMAL LOW (ref 135–145)
Total Bilirubin: 0.5 mg/dL (ref 0.3–1.2)
Total Protein: 7.9 g/dL (ref 6.5–8.1)

## 2022-09-22 LAB — CBC WITH DIFFERENTIAL/PLATELET
Abs Immature Granulocytes: 0.57 10*3/uL — ABNORMAL HIGH (ref 0.00–0.07)
Basophils Absolute: 0.2 10*3/uL — ABNORMAL HIGH (ref 0.0–0.1)
Basophils Relative: 1 %
Eosinophils Absolute: 0 10*3/uL (ref 0.0–0.5)
Eosinophils Relative: 0 %
HCT: 43.9 % (ref 36.0–46.0)
Hemoglobin: 12.8 g/dL (ref 12.0–15.0)
Immature Granulocytes: 2 %
Lymphocytes Relative: 6 %
Lymphs Abs: 1.5 10*3/uL (ref 0.7–4.0)
MCH: 26.1 pg (ref 26.0–34.0)
MCHC: 29.2 g/dL — ABNORMAL LOW (ref 30.0–36.0)
MCV: 89.6 fL (ref 80.0–100.0)
Monocytes Absolute: 0.8 10*3/uL (ref 0.1–1.0)
Monocytes Relative: 3 %
Neutro Abs: 22.8 10*3/uL — ABNORMAL HIGH (ref 1.7–7.7)
Neutrophils Relative %: 88 %
Platelets: 448 10*3/uL — ABNORMAL HIGH (ref 150–400)
RBC: 4.9 MIL/uL (ref 3.87–5.11)
RDW: 17.4 % — ABNORMAL HIGH (ref 11.5–15.5)
WBC: 25.8 10*3/uL — ABNORMAL HIGH (ref 4.0–10.5)
nRBC: 0.1 % (ref 0.0–0.2)

## 2022-09-22 LAB — URINALYSIS, ROUTINE W REFLEX MICROSCOPIC
Bacteria, UA: NONE SEEN
Bilirubin Urine: NEGATIVE
Glucose, UA: 50 mg/dL — AB
Hgb urine dipstick: NEGATIVE
Ketones, ur: 5 mg/dL — AB
Leukocytes,Ua: NEGATIVE
Nitrite: NEGATIVE
Protein, ur: >=300 mg/dL — AB
Specific Gravity, Urine: 1.025 (ref 1.005–1.030)
pH: 5 (ref 5.0–8.0)

## 2022-09-22 LAB — LACTIC ACID, PLASMA: Lactic Acid, Venous: 1.6 mmol/L (ref 0.5–1.9)

## 2022-09-22 LAB — I-STAT ARTERIAL BLOOD GAS, ED
Acid-Base Excess: 3 mmol/L — ABNORMAL HIGH (ref 0.0–2.0)
Bicarbonate: 28.9 mmol/L — ABNORMAL HIGH (ref 20.0–28.0)
Calcium, Ion: 1.23 mmol/L (ref 1.15–1.40)
HCT: 38 % (ref 36.0–46.0)
Hemoglobin: 12.9 g/dL (ref 12.0–15.0)
O2 Saturation: 97 %
Patient temperature: 98.6
Potassium: 5.4 mmol/L — ABNORMAL HIGH (ref 3.5–5.1)
Sodium: 137 mmol/L (ref 135–145)
TCO2: 30 mmol/L (ref 22–32)
pCO2 arterial: 48.7 mmHg — ABNORMAL HIGH (ref 32–48)
pH, Arterial: 7.381 (ref 7.35–7.45)
pO2, Arterial: 97 mmHg (ref 83–108)

## 2022-09-22 LAB — APTT: aPTT: 30 seconds (ref 24–36)

## 2022-09-22 LAB — PROTIME-INR
INR: 1.1 (ref 0.8–1.2)
Prothrombin Time: 13.9 seconds (ref 11.4–15.2)

## 2022-09-22 LAB — RESP PANEL BY RT-PCR (RSV, FLU A&B, COVID)  RVPGX2
Influenza A by PCR: NEGATIVE
Influenza B by PCR: NEGATIVE
Resp Syncytial Virus by PCR: NEGATIVE
SARS Coronavirus 2 by RT PCR: NEGATIVE

## 2022-09-22 MED ORDER — IOHEXOL 350 MG/ML SOLN
50.0000 mL | Freq: Once | INTRAVENOUS | Status: AC | PRN
  Administered 2022-09-22: 50 mL via INTRAVENOUS

## 2022-09-22 MED ORDER — CHLORHEXIDINE GLUCONATE CLOTH 2 % EX PADS: 6.0000 | MEDICATED_PAD | Freq: Every day | CUTANEOUS | Status: DC

## 2022-09-22 MED ORDER — ACETAMINOPHEN 325 MG PO TABS
650.0000 mg | ORAL_TABLET | Freq: Once | ORAL | Status: AC
  Administered 2022-09-22: 650 mg via ORAL
  Filled 2022-09-22: qty 2

## 2022-09-22 MED ORDER — SODIUM CHLORIDE 0.9% FLUSH: 10.0000 mL | Freq: Two times a day (BID) | INTRAVENOUS | Status: DC

## 2022-09-22 MED ORDER — SODIUM CHLORIDE 0.9% FLUSH: 10.0000 mL | INTRAVENOUS | Status: DC | PRN

## 2022-09-22 MED ORDER — SODIUM CHLORIDE 0.9 % IV SOLN
1.0000 g | Freq: Once | INTRAVENOUS | Status: AC
  Administered 2022-09-22: 1 g via INTRAVENOUS
  Filled 2022-09-22: qty 20

## 2022-09-22 MED ORDER — VANCOMYCIN HCL IN DEXTROSE 1-5 GM/200ML-% IV SOLN
1000.0000 mg | Freq: Once | INTRAVENOUS | Status: AC
  Administered 2022-09-22: 1000 mg via INTRAVENOUS
  Filled 2022-09-22: qty 200

## 2022-09-22 MED ORDER — SODIUM CHLORIDE 0.9 % IV BOLUS
1000.0000 mL | Freq: Once | INTRAVENOUS | Status: AC
  Administered 2022-09-22: 1000 mL via INTRAVENOUS

## 2022-09-22 NOTE — Progress Notes (Signed)
Peripherally Inserted Central Catheter Placement  The IV Nurse has discussed with the patient and/or persons authorized to consent for the patient, the purpose of this procedure and the potential benefits and risks involved with this procedure.  The benefits include less needle sticks, lab draws from the catheter, and the patient may be discharged home with the catheter. Risks include, but not limited to, infection, bleeding, blood clot (thrombus formation), and puncture of an artery; nerve damage and irregular heartbeat and possibility to perform a PICC exchange if needed/ordered by physician.  Alternatives to this procedure were also discussed.  Bard Power PICC patient education guide, fact sheet on infection prevention and patient information card has been provided to patient /or left at bedside.    PICC Placement Documentation  PICC Single Lumen 82/80/03 Left Basilic 42 cm 0 cm (Active)  Indication for Insertion or Continuance of Line Prolonged intravenous therapies 09/22/22 0839  Exposed Catheter (cm) 0 cm 09/22/22 0839  Site Assessment Clean, Dry, Intact 09/22/22 0839  Line Status Flushed;Saline locked;Blood return noted 09/22/22 0839  Dressing Type Transparent;Securing device 09/22/22 0839  Dressing Status Antimicrobial disc in place 09/22/22 0839  Safety Lock Not Applicable 49/17/91 5056  Line Adjustment (NICU/IV Team Only) No 09/22/22 0839  Dressing Intervention New dressing 09/22/22 0839  Dressing Change Due 09/29/22 09/22/22 0839    Patient's daughter, Kena Limon signed PICC consent. Verified by 2 PICC RNs.    Enos Fling 09/22/2022, 8:41 AM

## 2022-09-22 NOTE — Progress Notes (Signed)
RT NOTE:  Pt transported to CT and back to ER without event 

## 2022-09-22 NOTE — ED Triage Notes (Signed)
Pt bib [facility] from [facility] for tachycardia and trach issue. Pt was recently dx with pneumonia 3 days ago. Tonight pt's hr was 140, metoprolol given and pts hr went to 114. During this time pt's spo2 went down to 30% and [facility] reports having to assist ventilations with bvm. [facility] reports positional trach. Pt vent dependent and baseline.

## 2022-09-22 NOTE — Discharge Instructions (Addendum)
Your previous PICC line was found to be coiled and was replaced here successfully.  Given the otherwise reassuring workup we feel you are safe for discharge back to your facility to continue outpatient management of the multifocal pneumonia.  There is no evidence of blood clot on the CT scan and you are negative for COVID, flu, and RSV.  Please continue outpatient management.

## 2022-09-22 NOTE — ED Provider Notes (Signed)
7:50 AM Care assumed from Dr. Dina Rich.  At time of transfer of care, patient is awaiting replacement of her PICC line, once this is completed and verified, plan of care is for her to be discharged home to continue outpatient management of her multifocal pneumonia.  9:15 AM PICC line has been replaced and there is an order in the chart saying that it has been confirmed and is safe for use now.  Per previous plan, will discharge back to facility.   Clinical Impression: 1. Hospital-acquired pneumonia   2. Status post PICC central line placement     Disposition: Discharge  Condition: Good  I have discussed the results, Dx and Tx plan with the pt(& family if present). He/she/they expressed understanding and agree(s) with the plan. Discharge instructions discussed at great length. Strict return precautions discussed and pt &/or family have verbalized understanding of the instructions. No further questions at time of discharge.    New Prescriptions   No medications on file    Follow Up: No follow-up provider specified.    Paullette Mckain, Gwenyth Allegra, MD 09/22/22 [phone]

## 2022-09-22 NOTE — Progress Notes (Signed)
Patient with RUA SL PICC. CXR shows tip coiled in R IJ with downward projection. Primary RN notified that PICC can't be exchanged. A new insertion would be required.

## 2022-09-22 NOTE — ED Provider Notes (Signed)
[facility] EMERGENCY DEPARTMENT Provider Note   CSN: [id number] Arrival date & time: 09/22/22  0149     History  Chief Complaint  Patient presents with   Respiratory Distress   Tachycardia    Lindsey Collins is a 77 y.o. female.  HPI     This is a 77 year old female who comes from [facility] with concerns for tachycardia and hypoxia.  She has a chronic trach.  She was diagnosed with pneumonia 3 days ago.  Per [facility] she was noted to have heart rate in the 140s.  She was given metoprolol with some improvement.  SpO2 was concerning and she had drops into the 30s.  On my evaluation she is awake.  She nods her head yes and no to questions.  She denies any pain or shortness of breath.  Level 5 caveat  Home Medications Prior to Admission medications   Medication Sig Start Date End Date Taking? Authorizing Provider  acetaminophen (TYLENOL) 325 MG tablet Place 650 mg into feeding tube every 6 (six) hours as needed for moderate pain.    [provider]  acyclovir (ZOVIRAX) 800 MG tablet Take 800 mg by mouth every 6 (six) hours. For 7 days, via tube 07/04/21   [provider]  allopurinol (ZYLOPRIM) 100 MG tablet Place 200 mg into feeding tube daily.    [provider]  atorvastatin (LIPITOR) 20 MG tablet Place 20 mg into feeding tube at bedtime. 11/26/20   [provider]  carbidopa-levodopa (SINEMET IR) 25-100 MG tablet Place 1 tablet into feeding tube every 6 (six) hours. Times given : 00:00, 0600; 12:00, 1800 10/28/20   [provider]  chlorhexidine (PERIDEX) 0.12 % solution Use as directed 15 mLs in the mouth or throat 2 (two) times daily.    [provider]  cholecalciferol (VITAMIN D3) 25 MCG (1000 UNIT) tablet Place 1,000 Units into feeding tube daily.    [provider]  cloNIDine (CATAPRES) 0.1 MG tablet Place 0.1 mg into feeding tube every 8 (eight) hours as needed (SBP >180).    [provider]  divalproex (DEPAKOTE SPRINKLE) 125 MG capsule 500 mg 2 (two) times daily. Via tube 11/24/20   [provider]  doxycycline (VIBRAMYCIN) 100 MG capsule Take 1 capsule (100 mg total) by mouth 2 (two) times daily. One po bid x 7 days 05/11/22   Milton Ferguson, MD  FLUoxetine (PROZAC) 10 MG tablet Place 10 mg into feeding tube daily. 11/08/20   [provider]  Glucagon, rDNA, (GLUCAGON EMERGENCY) 1 MG KIT Inject 1 mg as directed as needed (blood sugar less than 70).    [provider]  insulin aspart (NOVOLOG) 100 UNIT/ML injection Inject 0-10 Units into the skin every 6 (six) hours. Sliding scale: CBG 150 = 0 units, 151-200 = 2 units, 201-250 = 4 units, 251-300 = 6 units, 301-350 = 8 units, 351-400 = 10 units.    [provider]  insulin glargine (LANTUS) 100 UNIT/ML injection Inject 0.3 mLs (30 Units total) into the skin at bedtime. Patient taking differently: Inject 30 Units into the skin 2 (two) times daily. 03/26/21   Minor, Grace Bushy, NP  ipratropium-albuterol (DUONEB) 0.5-2.5 (3) MG/3ML SOLN Take 3 mLs by nebulization every 6 (six) hours.    [provider]  megestrol (MEGACE) 40 MG tablet Take 1 tablet (40 mg total) by mouth daily. Can increase to two tablets twice a day in the event of heavy bleeding 05/22/22  Constant, Peggy, MD  metoprolol tartrate (LOPRESSOR) 25 MG tablet Place 12.5 mg into feeding tube 2 (two) times daily.    [provider]  Nutritional Supplements (FEEDING SUPPLEMENT, GLUCERNA 1.5 CAL,) LIQD Place 50 mLs into feeding tube continuous. 2ms per hour    [provider]  OLANZapine (ZYPREXA) 15 MG tablet Place 15 mg into feeding tube at bedtime.    [provider]  ondansetron (ZOFRAN) 4 MG/2ML SOLN injection Inject 4 mg into the vein every 6 (six) hours as needed for nausea or vomiting.    [provider]  ondansetron (ZOFRAN) 8 MG tablet Place 8 mg into feeding tube every 8 (eight) hours as  needed for nausea or vomiting.    [provider]  pantoprazole sodium (PROTONIX) 40 mg/20 mL PACK Place 40 mg into feeding tube 2 (two) times daily.    [provider]  polyethylene glycol (MIRALAX / GLYCOLAX) 17 g packet Place 17 g into feeding tube daily as needed for mild constipation.    [provider]  pramipexole (MIRAPEX) 0.125 MG tablet Place 0.125 mg into feeding tube every 8 (eight) hours. 11/05/20   [provider]  propylthiouracil (PTU) 50 MG tablet Place 50 mg into feeding tube every 12 (twelve) hours. 10/19/20   [provider]  Semaglutide, 1 MG/DOSE, (OZEMPIC, 1 MG/DOSE,) 2 MG/1.5ML SOPN Inject 1 mg into the skin once a week. Thursday's    [provider]      Allergies    Patient has no known allergies.    Review of Systems   Review of Systems  Constitutional:  Negative for fever.  Respiratory:  Negative for shortness of breath.   Cardiovascular:  Negative for chest pain.  All other systems reviewed and are negative.   Physical Exam Updated Vital Signs BP 92/68   Pulse 97   Temp (!) 97 F (36.1 C) (Axillary)   Resp 20   Ht 1.626 m ('5\' 4"'$ )   SpO2 100%   BMI 33.81 kg/m  Physical Exam Vitals and nursing note reviewed.  Constitutional:      Appearance: She is well-developed.     Comments: Chronically ill-appearing but nontoxic  HENT:     Head: Normocephalic and atraumatic.  Eyes:     Pupils: Pupils are equal, round, and reactive to light.  Cardiovascular:     Rate and Rhythm: Regular rhythm. Tachycardia present.     Heart sounds: Normal heart sounds.  Pulmonary:     Effort: Pulmonary effort is normal. No respiratory distress.     Breath sounds: No wheezing.     Comments: Achy ostomy in place, coarse breath sounds in all lung fields Abdominal:     Palpations: Abdomen is soft.     Tenderness: There is no abdominal tenderness.  Musculoskeletal:     Cervical back: Neck supple.     Right lower leg: No  edema.     Left lower leg: No edema.  Skin:    General: Skin is warm and dry.  Neurological:     Mental Status: She is alert.     Comments: Unable to assess orientation, symmetric face, follows simple commands  Psychiatric:        Mood and Affect: Mood normal.     ED Results / Procedures / Treatments   Labs (all labs ordered are listed, but only abnormal results are displayed) Labs Reviewed  COMPREHENSIVE METABOLIC PANEL - Abnormal; Notable for the following components:  Result Value   Sodium 134 (*)    Potassium 5.8 (*)    Chloride 97 (*)    Glucose, Bld 306 (*)    Albumin 3.3 (*)    All other components within normal limits  CBC WITH DIFFERENTIAL/PLATELET - Abnormal; Notable for the following components:   WBC 25.8 (*)    MCHC 29.2 (*)    RDW 17.4 (*)    Platelets 448 (*)    Neutro Abs 22.8 (*)    Basophils Absolute 0.2 (*)    Abs Immature Granulocytes 0.57 (*)    All other components within normal limits  URINALYSIS, ROUTINE W REFLEX MICROSCOPIC - Abnormal; Notable for the following components:   APPearance HAZY (*)    Glucose, UA 50 (*)    Ketones, ur 5 (*)    Protein, ur >=300 (*)    All other components within normal limits  I-STAT ARTERIAL BLOOD GAS, ED - Abnormal; Notable for the following components:   pCO2 arterial 48.7 (*)    Bicarbonate 28.9 (*)    Acid-Base Excess 3.0 (*)    Potassium 5.4 (*)    All other components within normal limits  RESP PANEL BY RT-PCR (RSV, FLU A&B, COVID)  RVPGX2  CULTURE, BLOOD (ROUTINE X 2)  CULTURE, BLOOD (ROUTINE X 2)  URINE CULTURE  LACTIC ACID, PLASMA  PROTIME-INR  APTT  BLOOD GAS, ARTERIAL    EKG EKG Interpretation  Date/Time:  Tuesday September 22 2022 02:25:24 EST Ventricular Rate:  121 PR Interval:  145 QRS Duration: 81 QT Interval:  336 QTC Calculation: 477 R Axis:   69 Text Interpretation: Sinus tachycardia Anteroseptal infarct, age indeterminate Confirmed by Thayer Jew ([id number]) on 09/22/2022  6:43:34 AM  Radiology [place] EKG SITE RITE  Result Date: 09/22/2022 If Site Rite image not attached, placement could not be confirmed due to current cardiac rhythm.  CT Angio Chest PE W and/or Wo Contrast  Result Date: 09/22/2022 CLINICAL DATA:  77 year old female with history of tachycardia and clinical suspicion for pulmonary embolism. EXAM: CT ANGIOGRAPHY CHEST WITH CONTRAST TECHNIQUE: Multidetector CT imaging of the chest was performed using the standard protocol during bolus administration of intravenous contrast. Multiplanar CT image reconstructions and MIPs were obtained to evaluate the vascular anatomy. RADIATION DOSE REDUCTION: This exam was performed according to the departmental dose-optimization program which includes automated exposure control, adjustment of the mA and/or kV according to patient size and/or use of iterative reconstruction technique. CONTRAST:  27m OMNIPAQUE IOHEXOL 350 MG/ML SOLN COMPARISON:  Chest CTA 01/14/2021. FINDINGS: Cardiovascular: No filling defects in the pulmonary arterial tree to suggest pulmonary embolism. Heart size is borderline enlarged. There is no significant pericardial fluid, thickening or pericardial calcification. There is aortic atherosclerosis, as well as atherosclerosis of the great vessels of the mediastinum and the coronary arteries, including calcified atherosclerotic plaque in the left main, left circumflex and right coronary arteries. Mediastinum/Nodes: No pathologically enlarged mediastinal or hilar lymph nodes. Esophagus is unremarkable in appearance. No axillary lymphadenopathy. Tracheostomy tube noted (tip 5.7 cm above the carina). Lungs/Pleura: Patchy multifocal airspace consolidation noted throughout the lungs bilaterally, most substantial in the lower lobes of the lungs. No significant pleural effusions. No pneumothorax. Upper Abdomen: Percutaneous gastrostomy tube noted with retention balloon in the stomach. Visualized portions of the liver  demonstrate a shrunken appearance and nodular contour, indicative of underlying cirrhosis. Atherosclerotic calcifications in the abdominal aorta. Musculoskeletal: There are no aggressive appearing lytic or blastic lesions noted in the visualized portions of the skeleton.  Review of the MIP images confirms the above findings. IMPRESSION: 1. No evidence of pulmonary embolism. 2. Multilobar bilateral pneumonia. 3. 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 Assessment for potential risk factor modification, dietary therapy or pharmacologic therapy may be warranted, if clinically indicated. Aortic Atherosclerosis (ICD10-I70.0). Electronically Signed   By: Vinnie Langton M.D.   On: 09/22/2022 06:18   DG Chest Port 1 View  Result Date: 09/22/2022 CLINICAL DATA:  Respiratory distress.  Questionable sepsis EXAM: PORTABLE CHEST 1 VIEW COMPARISON:  05/06/2022 FINDINGS: Right PICC is possibly looped in the right internal jugular vein with tip projected inferiorly. Tracheostomy tip in the upper intrathoracic trachea. Stable cardiomediastinal silhouette. Aortic atherosclerotic calcification. Bibasilar airspace opacities increased in the right lower lung compared with 05/11/2022. Fullness of the right hilum may be due to low lung volumes and prominent pulmonary arteries though adenopathy is difficult to exclude. Question small left pleural effusion. No pneumothorax. No acute osseous abnormality. IMPRESSION: Malpositioned right PICC line possibly looped in the internal jugular vein. Recommend repositioning. Bibasilar airspace opacities have increased since 05/11/2022 and may be due to pneumonia and/or aspiration. Left hilar fullness may be due to low lung volumes and prominent pulmonary arteries. Adenopathy is difficult to exclude. Consider CT chest for further evaluation. The malpositioned right PICC line was called by telephone at the time of interpretation on 09/22/2022 at 2:38 am to  provider [facility] , who verbally acknowledged these results. Electronically Signed   By: Placido Sou M.D.   On: 09/22/2022 02:39    Procedures .Critical Care  Performed by: Merryl Hacker, MD Authorized by: Merryl Hacker, MD   Critical care provider statement:    Critical care time (minutes):  35   Critical care was necessary to treat or prevent imminent or life-threatening deterioration of the following conditions:  Respiratory failure   Critical care was time spent personally by me on the following activities:  Development of treatment plan with patient or surrogate, discussions with consultants, evaluation of patient's response to treatment, examination of patient, ordering and review of laboratory studies, ordering and review of radiographic studies, ordering and performing treatments and interventions, pulse oximetry, re-evaluation of patient's condition and review of old charts     Medications Ordered in ED Medications  vancomycin (VANCOCIN) IVPB 1000 mg/200 mL premix (has no administration in time range)  sodium chloride 0.9 % bolus 1,000 mL (0 mLs Intravenous Stopped 09/22/22 0630)  acetaminophen (TYLENOL) tablet 650 mg (650 mg Oral Given 09/22/22 0359)  iohexol (OMNIPAQUE) 350 MG/ML injection 50 mL (50 mLs Intravenous Contrast Given 09/22/22 8546)    ED Course/ Medical Decision Making/ A&P Clinical Course as of 09/22/22 0653  Tue Sep 22, 2022  2703 Chart reviewed from [facility].  She has been on meropenem and Vancomycin.  She is receiving meropenem twice daily at 9 AM and 9 PM and last dose of vancomycin was yesterday at 9 AM. [CH]  [id number] On repeat evaluation, patient is comfortable.  Pulse rate has normalized.  She is in no respiratory distress.  She does not appear to be in severe sepsis.  She does have a leukocytosis and both chest x-ray and CT confirmed multifocal pneumonia for which she is being treated with Vanco meropenem at [facility].  Her PICC line is malposition.   This will be discontinued and replaced.  I have ordered Vanco and meropenem for her morning dose.  Given that she does not appear to be in sepsis or  septic shock, feel she can go back to [facility]. [CH]    Clinical Course User Index [CH] Adi Doro, Barbette Hair, MD                           Medical Decision Making Amount and/or Complexity of Data Reviewed Labs: ordered. Radiology: ordered. ECG/medicine tests: ordered.  Risk OTC drugs. Prescription drug management.   This patient presents to the ED for concern of tachycardia, pneumonia, this involves an extensive number of treatment options, and is a complaint that carries with it a high risk of complications and morbidity.  I considered the following differential and admission for this acute, potentially life threatening condition.  The differential diagnosis includes arrhythmia, sepsis, septic shock, respiratory failure worsening  MDM:    This is a 77 year old female who presents with concerns for tachycardia.  Currently being treated for pneumonia at [facility] with vancomycin and meropenem.  On initial evaluation she is nontoxic-appearing.  She is chronically ill-appearing.  She is tachycardic but is not in any respiratory distress.  O2 sats are 100%.  She is afebrile.  Vital signs are otherwise reassuring.  Sepsis workup initiated.  Workup is notable for leukocytosis to 25.8.  She has some mild hyperkalemia at 5.8 without EKG changes.  She was given fluids.  She also has some mild hyperglycemia.  Lactate is normal.  No signs or symptoms of severe sepsis or septic shock.  Heart rate normalized with fluids and Tylenol.  Chest x-ray does confirm multifocal pneumonia with some other additional abnormalities.  For this reason, CT scan was obtained.  CT PE study does not show any evidence of PE.  It confirms multifocal pneumonia.  Of note, PICC line appears malpositioned.  This will be removed and replaced.  Feel that she can continue to be treated for  pneumonia at [facility] given that she does not appear septic.  She was given a morning dose of vancomycin and meropenem.  (Labs, imaging, consults)  Labs: I Ordered, and personally interpreted labs.  The pertinent results include: CBC, BMP, lactate, urinalysis  Imaging Studies ordered: I ordered imaging studies including chest x-ray, CT PE study I independently visualized and interpreted imaging. I agree with the radiologist interpretation  Additional history obtained from chart review, [facility].  External records from outside source obtained and reviewed including prior evaluations  Cardiac Monitoring: The patient was maintained on a cardiac monitor.  I personally viewed and interpreted the cardiac monitored which showed an underlying rhythm of: Sinus rhythm  Reevaluation: After the interventions noted above, I reevaluated the patient and found that they have :improved  Social Determinants of Health:  at [place]  Disposition: Plan to discharge back to [facility]  Co morbidities that complicate the patient evaluation  Past Medical History:  Diagnosis Date   Acute on chronic respiratory failure with hypoxia ([place])    ARDS (adult respiratory distress syndrome) ([employer])    Chronic kidney disease, stage II (mild)    MRSA (methicillin resistant staphylococcus aureus) pneumonia ([facility])    Obstructive chronic bronchitis without exacerbation      Medicines Meds ordered this encounter  Medications   sodium chloride 0.9 % bolus 1,000 mL   acetaminophen (TYLENOL) tablet 650 mg   iohexol (OMNIPAQUE) 350 MG/ML injection 50 mL   vancomycin (VANCOCIN) IVPB 1000 mg/200 mL premix    Order Specific Question:   Indication:    Answer:   Pneumonia    I have reviewed the patients home  medicines and have made adjustments as needed  Problem List / ED Course: Problem List Items Addressed This Visit   None Visit Diagnoses     Hospital-acquired pneumonia    -  Primary   Relevant Medications    vancomycin (VANCOCIN) IVPB 1000 mg/200 mL premix (Start on 09/22/2022  7:00 AM)                   Final Clinical Impression(s) / ED Diagnoses Final diagnoses:  Hospital-acquired pneumonia    Rx / DC Orders ED Discharge Orders     None         Merryl Hacker, MD 09/22/22 [phone]

## 2022-09-24 LAB — URINE CULTURE: Culture: 10000 — AB

## 2022-09-25 ENCOUNTER — Telehealth (HOSPITAL_BASED_OUTPATIENT_CLINIC_OR_DEPARTMENT_OTHER): Payer: Self-pay | Admitting: *Deleted

## 2022-09-25 NOTE — Telephone Encounter (Signed)
Post ED Visit - Positive Culture Follow-up  Culture report reviewed by antimicrobial stewardship pharmacist: [place] [facility] '[]'$  Elenor Quinones, Pharm.D. '[]'$  Heide Guile, Pharm.D., BCPS AQ-ID '[]'$  Parks Neptune, Pharm.D., BCPS '[]'$  Alycia Rossetti, Pharm.D., BCPS '[]'$  [place], Pharm.D., BCPS, AAHIVP '[]'$  Legrand Como, Pharm.D., BCPS, AAHIVP '[]'$  Salome Arnt, PharmD, BCPS '[]'$  Johnnette Gourd, PharmD, BCPS '[]'$  Hughes Better, PharmD, BCPS '[]'$  Leeroy Cha, PharmD '[]'$  Laqueta Linden, PharmD, BCPS '[x]'$  Luisa Hart, PharmD  [place] Team '[]'$  Leodis Sias, PharmD '[]'$  Lindell Spar, PharmD '[]'$  Royetta Asal, PharmD '[]'$  Graylin Shiver, Rph '[]'$  Rema Fendt) Glennon Mac, PharmD '[]'$  Arlyn Dunning, PharmD '[]'$  Netta Cedars, PharmD '[]'$  Dia Sitter, PharmD '[]'$  Leone Haven, PharmD '[]'$  Gretta Arab, PharmD '[]'$  Theodis Shove, PharmD '[]'$  Peggyann Juba, PharmD '[]'$  Reuel Boom, PharmD   Positive urine culture Pt was discharged back to [facility] to continue IV abx.  Insignificant urine culture growth. No further patient follow-up is required at this time per Dion Saucier, PA-C  Rosie Fate 09/25/2022, 2:07 PM

## 2022-09-27 LAB — CULTURE, BLOOD (ROUTINE X 2)
Culture: NO GROWTH
Culture: NO GROWTH
Special Requests: ADEQUATE
Special Requests: ADEQUATE

## 2022-09-29 DIAGNOSIS — G20A1 Parkinson's disease without dyskinesia, without mention of fluctuations: Secondary | ICD-10-CM

## 2022-09-29 DIAGNOSIS — I5032 Chronic diastolic (congestive) heart failure: Secondary | ICD-10-CM

## 2022-09-29 DIAGNOSIS — D649 Anemia, unspecified: Secondary | ICD-10-CM

## 2022-09-29 DIAGNOSIS — I4891 Unspecified atrial fibrillation: Secondary | ICD-10-CM

## 2022-09-29 DIAGNOSIS — J9621 Acute and chronic respiratory failure with hypoxia: Secondary | ICD-10-CM

## 2022-10-13 DIAGNOSIS — I4891 Unspecified atrial fibrillation: Secondary | ICD-10-CM

## 2022-10-13 DIAGNOSIS — I5032 Chronic diastolic (congestive) heart failure: Secondary | ICD-10-CM | POA: Diagnosis not present

## 2022-10-13 DIAGNOSIS — D649 Anemia, unspecified: Secondary | ICD-10-CM | POA: Diagnosis not present

## 2022-10-13 DIAGNOSIS — G20A1 Parkinson's disease without dyskinesia, without mention of fluctuations: Secondary | ICD-10-CM | POA: Diagnosis not present

## 2022-10-13 DIAGNOSIS — J9621 Acute and chronic respiratory failure with hypoxia: Secondary | ICD-10-CM | POA: Diagnosis not present

## 2022-10-27 DIAGNOSIS — I5032 Chronic diastolic (congestive) heart failure: Secondary | ICD-10-CM | POA: Diagnosis not present

## 2022-10-27 DIAGNOSIS — I4891 Unspecified atrial fibrillation: Secondary | ICD-10-CM

## 2022-10-27 DIAGNOSIS — G20A1 Parkinson's disease without dyskinesia, without mention of fluctuations: Secondary | ICD-10-CM | POA: Diagnosis not present

## 2022-10-27 DIAGNOSIS — J9621 Acute and chronic respiratory failure with hypoxia: Secondary | ICD-10-CM | POA: Diagnosis not present

## 2022-10-27 DIAGNOSIS — D649 Anemia, unspecified: Secondary | ICD-10-CM | POA: Diagnosis not present

## 2022-11-10 DIAGNOSIS — D649 Anemia, unspecified: Secondary | ICD-10-CM | POA: Diagnosis not present

## 2022-11-10 DIAGNOSIS — I5032 Chronic diastolic (congestive) heart failure: Secondary | ICD-10-CM | POA: Diagnosis not present

## 2022-11-10 DIAGNOSIS — J9621 Acute and chronic respiratory failure with hypoxia: Secondary | ICD-10-CM | POA: Diagnosis not present

## 2022-11-10 DIAGNOSIS — R1310 Dysphagia, unspecified: Secondary | ICD-10-CM

## 2022-11-10 DIAGNOSIS — G20A1 Parkinson's disease without dyskinesia, without mention of fluctuations: Secondary | ICD-10-CM | POA: Diagnosis not present

## 2022-11-24 DIAGNOSIS — I5032 Chronic diastolic (congestive) heart failure: Secondary | ICD-10-CM | POA: Diagnosis not present

## 2022-11-24 DIAGNOSIS — J9621 Acute and chronic respiratory failure with hypoxia: Secondary | ICD-10-CM | POA: Diagnosis not present

## 2022-11-24 DIAGNOSIS — G20A1 Parkinson's disease without dyskinesia, without mention of fluctuations: Secondary | ICD-10-CM | POA: Diagnosis not present

## 2022-11-24 DIAGNOSIS — D649 Anemia, unspecified: Secondary | ICD-10-CM | POA: Diagnosis not present

## 2022-11-24 DIAGNOSIS — R1310 Dysphagia, unspecified: Secondary | ICD-10-CM

## 2022-12-08 DIAGNOSIS — G20A1 Parkinson's disease without dyskinesia, without mention of fluctuations: Secondary | ICD-10-CM | POA: Diagnosis not present

## 2022-12-08 DIAGNOSIS — R1310 Dysphagia, unspecified: Secondary | ICD-10-CM

## 2022-12-08 DIAGNOSIS — J9621 Acute and chronic respiratory failure with hypoxia: Secondary | ICD-10-CM | POA: Diagnosis not present

## 2022-12-08 DIAGNOSIS — I5032 Chronic diastolic (congestive) heart failure: Secondary | ICD-10-CM | POA: Diagnosis not present

## 2022-12-08 DIAGNOSIS — D649 Anemia, unspecified: Secondary | ICD-10-CM | POA: Diagnosis not present

## 2022-12-22 DIAGNOSIS — R1310 Dysphagia, unspecified: Secondary | ICD-10-CM

## 2022-12-22 DIAGNOSIS — J9621 Acute and chronic respiratory failure with hypoxia: Secondary | ICD-10-CM

## 2022-12-22 DIAGNOSIS — I5032 Chronic diastolic (congestive) heart failure: Secondary | ICD-10-CM

## 2022-12-22 DIAGNOSIS — G20A1 Parkinson's disease without dyskinesia, without mention of fluctuations: Secondary | ICD-10-CM

## 2022-12-22 DIAGNOSIS — D649 Anemia, unspecified: Secondary | ICD-10-CM

## 2023-01-05 DIAGNOSIS — D649 Anemia, unspecified: Secondary | ICD-10-CM | POA: Diagnosis not present

## 2023-01-05 DIAGNOSIS — I5032 Chronic diastolic (congestive) heart failure: Secondary | ICD-10-CM | POA: Diagnosis not present

## 2023-01-05 DIAGNOSIS — J9621 Acute and chronic respiratory failure with hypoxia: Secondary | ICD-10-CM | POA: Diagnosis not present

## 2023-01-05 DIAGNOSIS — G20A1 Parkinson's disease without dyskinesia, without mention of fluctuations: Secondary | ICD-10-CM | POA: Diagnosis not present

## 2023-01-05 DIAGNOSIS — R1310 Dysphagia, unspecified: Secondary | ICD-10-CM

## 2023-01-19 DIAGNOSIS — I5032 Chronic diastolic (congestive) heart failure: Secondary | ICD-10-CM | POA: Diagnosis not present

## 2023-01-19 DIAGNOSIS — J9621 Acute and chronic respiratory failure with hypoxia: Secondary | ICD-10-CM | POA: Diagnosis not present

## 2023-01-19 DIAGNOSIS — G20A1 Parkinson's disease without dyskinesia, without mention of fluctuations: Secondary | ICD-10-CM | POA: Diagnosis not present

## 2023-01-19 DIAGNOSIS — R1310 Dysphagia, unspecified: Secondary | ICD-10-CM

## 2023-01-19 DIAGNOSIS — D649 Anemia, unspecified: Secondary | ICD-10-CM | POA: Diagnosis not present

## 2023-02-02 DIAGNOSIS — I5032 Chronic diastolic (congestive) heart failure: Secondary | ICD-10-CM | POA: Diagnosis not present

## 2023-02-02 DIAGNOSIS — J9621 Acute and chronic respiratory failure with hypoxia: Secondary | ICD-10-CM | POA: Diagnosis not present

## 2023-02-02 DIAGNOSIS — D649 Anemia, unspecified: Secondary | ICD-10-CM | POA: Diagnosis not present

## 2023-02-02 DIAGNOSIS — G20A1 Parkinson's disease without dyskinesia, without mention of fluctuations: Secondary | ICD-10-CM | POA: Diagnosis not present

## 2023-02-02 DIAGNOSIS — R1310 Dysphagia, unspecified: Secondary | ICD-10-CM

## 2023-02-16 DIAGNOSIS — J9621 Acute and chronic respiratory failure with hypoxia: Secondary | ICD-10-CM | POA: Diagnosis not present

## 2023-02-16 DIAGNOSIS — G20A1 Parkinson's disease without dyskinesia, without mention of fluctuations: Secondary | ICD-10-CM | POA: Diagnosis not present

## 2023-02-16 DIAGNOSIS — I5032 Chronic diastolic (congestive) heart failure: Secondary | ICD-10-CM | POA: Diagnosis not present

## 2023-02-16 DIAGNOSIS — D649 Anemia, unspecified: Secondary | ICD-10-CM | POA: Diagnosis not present

## 2023-02-16 DIAGNOSIS — R1310 Dysphagia, unspecified: Secondary | ICD-10-CM

## 2023-03-02 DIAGNOSIS — D649 Anemia, unspecified: Secondary | ICD-10-CM | POA: Diagnosis not present

## 2023-03-02 DIAGNOSIS — R1310 Dysphagia, unspecified: Secondary | ICD-10-CM

## 2023-03-02 DIAGNOSIS — J9621 Acute and chronic respiratory failure with hypoxia: Secondary | ICD-10-CM | POA: Diagnosis not present

## 2023-03-02 DIAGNOSIS — G20A1 Parkinson's disease without dyskinesia, without mention of fluctuations: Secondary | ICD-10-CM | POA: Diagnosis not present

## 2023-03-02 DIAGNOSIS — I5032 Chronic diastolic (congestive) heart failure: Secondary | ICD-10-CM | POA: Diagnosis not present

## 2023-03-16 DIAGNOSIS — R1310 Dysphagia, unspecified: Secondary | ICD-10-CM

## 2023-03-16 DIAGNOSIS — G20A1 Parkinson's disease without dyskinesia, without mention of fluctuations: Secondary | ICD-10-CM | POA: Diagnosis not present

## 2023-03-16 DIAGNOSIS — J9621 Acute and chronic respiratory failure with hypoxia: Secondary | ICD-10-CM | POA: Diagnosis not present

## 2023-03-16 DIAGNOSIS — I5032 Chronic diastolic (congestive) heart failure: Secondary | ICD-10-CM | POA: Diagnosis not present

## 2023-03-16 DIAGNOSIS — D649 Anemia, unspecified: Secondary | ICD-10-CM | POA: Diagnosis not present

## 2023-03-30 DIAGNOSIS — G20A1 Parkinson's disease without dyskinesia, without mention of fluctuations: Secondary | ICD-10-CM | POA: Diagnosis not present

## 2023-03-30 DIAGNOSIS — J9621 Acute and chronic respiratory failure with hypoxia: Secondary | ICD-10-CM | POA: Diagnosis not present

## 2023-03-30 DIAGNOSIS — D649 Anemia, unspecified: Secondary | ICD-10-CM | POA: Diagnosis not present

## 2023-03-30 DIAGNOSIS — R1310 Dysphagia, unspecified: Secondary | ICD-10-CM

## 2023-03-30 DIAGNOSIS — I5032 Chronic diastolic (congestive) heart failure: Secondary | ICD-10-CM | POA: Diagnosis not present

## 2023-04-13 DIAGNOSIS — R1310 Dysphagia, unspecified: Secondary | ICD-10-CM

## 2023-04-13 DIAGNOSIS — D649 Anemia, unspecified: Secondary | ICD-10-CM | POA: Diagnosis not present

## 2023-04-13 DIAGNOSIS — E119 Type 2 diabetes mellitus without complications: Secondary | ICD-10-CM

## 2023-04-13 DIAGNOSIS — J9621 Acute and chronic respiratory failure with hypoxia: Secondary | ICD-10-CM | POA: Diagnosis not present

## 2023-04-13 DIAGNOSIS — I5032 Chronic diastolic (congestive) heart failure: Secondary | ICD-10-CM | POA: Diagnosis not present

## 2023-04-13 DIAGNOSIS — F3131 Bipolar disorder, current episode depressed, mild: Secondary | ICD-10-CM

## 2023-04-13 DIAGNOSIS — G20A1 Parkinson's disease without dyskinesia, without mention of fluctuations: Secondary | ICD-10-CM | POA: Diagnosis not present

## 2023-04-27 DIAGNOSIS — D649 Anemia, unspecified: Secondary | ICD-10-CM | POA: Diagnosis not present

## 2023-04-27 DIAGNOSIS — G20A1 Parkinson's disease without dyskinesia, without mention of fluctuations: Secondary | ICD-10-CM | POA: Diagnosis not present

## 2023-04-27 DIAGNOSIS — J9621 Acute and chronic respiratory failure with hypoxia: Secondary | ICD-10-CM

## 2023-04-27 DIAGNOSIS — R1310 Dysphagia, unspecified: Secondary | ICD-10-CM

## 2023-04-27 DIAGNOSIS — I5032 Chronic diastolic (congestive) heart failure: Secondary | ICD-10-CM | POA: Diagnosis not present

## 2023-04-27 DIAGNOSIS — E119 Type 2 diabetes mellitus without complications: Secondary | ICD-10-CM

## 2023-04-27 DIAGNOSIS — F3131 Bipolar disorder, current episode depressed, mild: Secondary | ICD-10-CM | POA: Diagnosis not present

## 2023-05-11 DIAGNOSIS — E119 Type 2 diabetes mellitus without complications: Secondary | ICD-10-CM

## 2023-05-11 DIAGNOSIS — I5032 Chronic diastolic (congestive) heart failure: Secondary | ICD-10-CM | POA: Diagnosis not present

## 2023-05-11 DIAGNOSIS — F3131 Bipolar disorder, current episode depressed, mild: Secondary | ICD-10-CM | POA: Diagnosis not present

## 2023-05-11 DIAGNOSIS — D649 Anemia, unspecified: Secondary | ICD-10-CM | POA: Diagnosis not present

## 2023-05-11 DIAGNOSIS — J9621 Acute and chronic respiratory failure with hypoxia: Secondary | ICD-10-CM

## 2023-05-11 DIAGNOSIS — G20A1 Parkinson's disease without dyskinesia, without mention of fluctuations: Secondary | ICD-10-CM | POA: Diagnosis not present

## 2023-05-11 DIAGNOSIS — R1310 Dysphagia, unspecified: Secondary | ICD-10-CM

## 2023-05-25 DIAGNOSIS — R1310 Dysphagia, unspecified: Secondary | ICD-10-CM

## 2023-05-25 DIAGNOSIS — D649 Anemia, unspecified: Secondary | ICD-10-CM | POA: Diagnosis not present

## 2023-05-25 DIAGNOSIS — E119 Type 2 diabetes mellitus without complications: Secondary | ICD-10-CM

## 2023-05-25 DIAGNOSIS — G20A1 Parkinson's disease without dyskinesia, without mention of fluctuations: Secondary | ICD-10-CM | POA: Diagnosis not present

## 2023-05-25 DIAGNOSIS — F3131 Bipolar disorder, current episode depressed, mild: Secondary | ICD-10-CM | POA: Diagnosis not present

## 2023-05-25 DIAGNOSIS — I5032 Chronic diastolic (congestive) heart failure: Secondary | ICD-10-CM | POA: Diagnosis not present

## 2023-05-25 DIAGNOSIS — J9621 Acute and chronic respiratory failure with hypoxia: Secondary | ICD-10-CM

## 2023-06-08 DIAGNOSIS — D649 Anemia, unspecified: Secondary | ICD-10-CM | POA: Diagnosis not present

## 2023-06-08 DIAGNOSIS — I5032 Chronic diastolic (congestive) heart failure: Secondary | ICD-10-CM | POA: Diagnosis not present

## 2023-06-08 DIAGNOSIS — E119 Type 2 diabetes mellitus without complications: Secondary | ICD-10-CM

## 2023-06-08 DIAGNOSIS — J9621 Acute and chronic respiratory failure with hypoxia: Secondary | ICD-10-CM

## 2023-06-08 DIAGNOSIS — G20A1 Parkinson's disease without dyskinesia, without mention of fluctuations: Secondary | ICD-10-CM | POA: Diagnosis not present

## 2023-06-08 DIAGNOSIS — F3131 Bipolar disorder, current episode depressed, mild: Secondary | ICD-10-CM | POA: Diagnosis not present

## 2023-06-08 DIAGNOSIS — R1310 Dysphagia, unspecified: Secondary | ICD-10-CM

## 2023-06-22 DIAGNOSIS — J9621 Acute and chronic respiratory failure with hypoxia: Secondary | ICD-10-CM

## 2023-06-22 DIAGNOSIS — I5032 Chronic diastolic (congestive) heart failure: Secondary | ICD-10-CM | POA: Diagnosis not present

## 2023-06-22 DIAGNOSIS — F3131 Bipolar disorder, current episode depressed, mild: Secondary | ICD-10-CM

## 2023-06-22 DIAGNOSIS — E119 Type 2 diabetes mellitus without complications: Secondary | ICD-10-CM | POA: Diagnosis not present

## 2023-06-22 DIAGNOSIS — G20A1 Parkinson's disease without dyskinesia, without mention of fluctuations: Secondary | ICD-10-CM | POA: Diagnosis not present

## 2023-06-22 DIAGNOSIS — D649 Anemia, unspecified: Secondary | ICD-10-CM | POA: Diagnosis not present

## 2023-06-22 DIAGNOSIS — R1310 Dysphagia, unspecified: Secondary | ICD-10-CM

## 2023-07-06 DIAGNOSIS — J9621 Acute and chronic respiratory failure with hypoxia: Secondary | ICD-10-CM

## 2023-07-06 DIAGNOSIS — G20A1 Parkinson's disease without dyskinesia, without mention of fluctuations: Secondary | ICD-10-CM | POA: Diagnosis not present

## 2023-07-06 DIAGNOSIS — F3131 Bipolar disorder, current episode depressed, mild: Secondary | ICD-10-CM

## 2023-07-06 DIAGNOSIS — I5032 Chronic diastolic (congestive) heart failure: Secondary | ICD-10-CM | POA: Diagnosis not present

## 2023-07-06 DIAGNOSIS — R1310 Dysphagia, unspecified: Secondary | ICD-10-CM

## 2023-07-06 DIAGNOSIS — E119 Type 2 diabetes mellitus without complications: Secondary | ICD-10-CM | POA: Diagnosis not present

## 2023-07-06 DIAGNOSIS — D649 Anemia, unspecified: Secondary | ICD-10-CM | POA: Diagnosis not present

## 2023-07-20 DIAGNOSIS — R1310 Dysphagia, unspecified: Secondary | ICD-10-CM

## 2023-07-20 DIAGNOSIS — D649 Anemia, unspecified: Secondary | ICD-10-CM | POA: Diagnosis not present

## 2023-07-20 DIAGNOSIS — F3131 Bipolar disorder, current episode depressed, mild: Secondary | ICD-10-CM | POA: Diagnosis not present

## 2023-07-20 DIAGNOSIS — G20A1 Parkinson's disease without dyskinesia, without mention of fluctuations: Secondary | ICD-10-CM | POA: Diagnosis not present

## 2023-07-20 DIAGNOSIS — J9621 Acute and chronic respiratory failure with hypoxia: Secondary | ICD-10-CM

## 2023-07-20 DIAGNOSIS — I5032 Chronic diastolic (congestive) heart failure: Secondary | ICD-10-CM | POA: Diagnosis not present

## 2023-07-20 DIAGNOSIS — E119 Type 2 diabetes mellitus without complications: Secondary | ICD-10-CM

## 2023-08-03 DIAGNOSIS — R1310 Dysphagia, unspecified: Secondary | ICD-10-CM

## 2023-08-03 DIAGNOSIS — E119 Type 2 diabetes mellitus without complications: Secondary | ICD-10-CM

## 2023-08-03 DIAGNOSIS — F3131 Bipolar disorder, current episode depressed, mild: Secondary | ICD-10-CM | POA: Diagnosis not present

## 2023-08-03 DIAGNOSIS — J9621 Acute and chronic respiratory failure with hypoxia: Secondary | ICD-10-CM

## 2023-08-03 DIAGNOSIS — D649 Anemia, unspecified: Secondary | ICD-10-CM | POA: Diagnosis not present

## 2023-08-03 DIAGNOSIS — I5032 Chronic diastolic (congestive) heart failure: Secondary | ICD-10-CM | POA: Diagnosis not present

## 2023-08-03 DIAGNOSIS — G20A1 Parkinson's disease without dyskinesia, without mention of fluctuations: Secondary | ICD-10-CM | POA: Diagnosis not present

## 2023-08-17 DIAGNOSIS — G20A1 Parkinson's disease without dyskinesia, without mention of fluctuations: Secondary | ICD-10-CM

## 2023-08-17 DIAGNOSIS — F3131 Bipolar disorder, current episode depressed, mild: Secondary | ICD-10-CM

## 2023-08-17 DIAGNOSIS — D649 Anemia, unspecified: Secondary | ICD-10-CM

## 2023-08-17 DIAGNOSIS — E119 Type 2 diabetes mellitus without complications: Secondary | ICD-10-CM

## 2023-08-17 DIAGNOSIS — I5032 Chronic diastolic (congestive) heart failure: Secondary | ICD-10-CM

## 2023-08-17 DIAGNOSIS — J9621 Acute and chronic respiratory failure with hypoxia: Secondary | ICD-10-CM

## 2023-08-17 DIAGNOSIS — R1310 Dysphagia, unspecified: Secondary | ICD-10-CM

## 2023-08-31 DIAGNOSIS — I5032 Chronic diastolic (congestive) heart failure: Secondary | ICD-10-CM | POA: Diagnosis not present

## 2023-08-31 DIAGNOSIS — R1310 Dysphagia, unspecified: Secondary | ICD-10-CM | POA: Diagnosis not present

## 2023-08-31 DIAGNOSIS — R5381 Other malaise: Secondary | ICD-10-CM | POA: Diagnosis not present

## 2023-08-31 DIAGNOSIS — J9621 Acute and chronic respiratory failure with hypoxia: Secondary | ICD-10-CM | POA: Diagnosis not present
